# Patient Record
Sex: Female | Born: 1990
Health system: Southern US, Community
[De-identification: ages and names within clinical notes are randomized; demographics above are authoritative.]

## PROBLEM LIST (undated history)

## (undated) ENCOUNTER — Emergency Department (HOSPITAL_COMMUNITY): Admission: EM | Payer: Medicaid Other | Source: Home / Self Care

## (undated) DIAGNOSIS — F319 Bipolar disorder, unspecified: Secondary | ICD-10-CM

## (undated) DIAGNOSIS — A749 Chlamydial infection, unspecified: Secondary | ICD-10-CM

## (undated) DIAGNOSIS — F419 Anxiety disorder, unspecified: Secondary | ICD-10-CM

## (undated) DIAGNOSIS — N760 Acute vaginitis: Secondary | ICD-10-CM

## (undated) HISTORY — PX: OTHER SURGICAL HISTORY: SHX169

## (undated) HISTORY — DX: Chlamydial infection, unspecified: A74.9

## (undated) HISTORY — PX: ABSCESS DRAINAGE: SHX1119

## (undated) HISTORY — DX: Acute vaginitis: N76.0

## (undated) HISTORY — PX: HEEL SPUR SURGERY: SHX665

## (undated) HISTORY — PX: TONSILLECTOMY AND ADENOIDECTOMY: SUR1326

---
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
## 2008-10-02 ENCOUNTER — Inpatient Hospital Stay (HOSPITAL_COMMUNITY): Admission: EM | Admit: 2008-10-02 | Discharge: 2008-10-06 | Payer: Self-pay | Admitting: Emergency Medicine

## 2008-10-06 ENCOUNTER — Ambulatory Visit: Payer: Self-pay | Admitting: Psychiatry

## 2008-10-06 ENCOUNTER — Inpatient Hospital Stay (HOSPITAL_COMMUNITY): Admission: RE | Admit: 2008-10-06 | Discharge: 2008-10-13 | Payer: Self-pay | Admitting: Psychiatry

## 2009-12-19 ENCOUNTER — Emergency Department (HOSPITAL_COMMUNITY): Admission: EM | Admit: 2009-12-19 | Discharge: 2009-12-20 | Payer: Self-pay | Admitting: Emergency Medicine

## 2009-12-20 ENCOUNTER — Inpatient Hospital Stay (HOSPITAL_COMMUNITY): Admission: AD | Admit: 2009-12-20 | Discharge: 2009-12-24 | Payer: Self-pay | Admitting: Psychiatry

## 2009-12-20 ENCOUNTER — Ambulatory Visit: Payer: Self-pay | Admitting: Psychiatry

## 2010-01-11 ENCOUNTER — Emergency Department (HOSPITAL_COMMUNITY): Admission: EM | Admit: 2010-01-11 | Discharge: 2010-01-13 | Payer: Self-pay | Admitting: Emergency Medicine

## 2010-02-21 ENCOUNTER — Inpatient Hospital Stay (HOSPITAL_COMMUNITY): Admission: EM | Admit: 2010-02-21 | Discharge: 2010-02-24 | Payer: Self-pay | Admitting: Psychiatry

## 2010-02-21 ENCOUNTER — Ambulatory Visit: Payer: Self-pay | Admitting: Psychiatry

## 2010-02-21 ENCOUNTER — Emergency Department (HOSPITAL_COMMUNITY): Admission: EM | Admit: 2010-02-21 | Discharge: 2010-02-21 | Payer: Self-pay | Admitting: Emergency Medicine

## 2010-04-12 ENCOUNTER — Ambulatory Visit: Payer: Self-pay | Admitting: Psychiatry

## 2010-08-18 LAB — COMPREHENSIVE METABOLIC PANEL
ALT: 10 U/L (ref 0–35)
CO2: 24 mEq/L (ref 19–32)
Creatinine, Ser: 0.76 mg/dL (ref 0.4–1.2)
GFR calc Af Amer: >60 mL/min (ref >60)
GFR calc non Af Amer: >60 mL/min (ref >60)
Potassium: 2.9 mEq/L — ABNORMAL LOW (ref 3.5–5.1)
Sodium: 140 mEq/L (ref 135–145)
Total Bilirubin: 1 mg/dL (ref 0.3–1.2)
Total Protein: 7 g/dL (ref 6.0–8.3)

## 2010-08-18 LAB — URINALYSIS, ROUTINE W REFLEX MICROSCOPIC
Glucose, UA: NEGATIVE mg/dL
Hgb urine dipstick: NEGATIVE
Specific Gravity, Urine: 1.031 — ABNORMAL HIGH (ref 1.005–1.030)
pH: 6 (ref 5.0–8.0)

## 2010-08-18 LAB — DIFFERENTIAL
Basophils Absolute: 0 10*3/uL (ref 0.0–0.1)
Basophils Relative: 1 % (ref 0–1)
Lymphocytes Relative: 40 % (ref 12–46)
Monocytes Absolute: 1.1 10*3/uL — ABNORMAL HIGH (ref 0.1–1.0)
Monocytes Relative: 13 % — ABNORMAL HIGH (ref 3–12)
Neutro Abs: 4 10*3/uL (ref 1.7–7.7)

## 2010-08-18 LAB — POCT PREGNANCY, URINE: Preg Test, Ur: NEGATIVE

## 2010-08-18 LAB — URINE MICROSCOPIC-ADD ON

## 2010-08-18 LAB — RAPID URINE DRUG SCREEN, HOSP PERFORMED
Barbiturates: NOT DETECTED
Benzodiazepines: NOT DETECTED
Opiates: NOT DETECTED

## 2010-08-18 LAB — SALICYLATE LEVEL: Salicylate Lvl: <4 mg/dL (ref 2.8–20.0)

## 2010-08-18 LAB — CBC: MCHC: 34.1 g/dL (ref 30.0–36.0)

## 2010-08-18 LAB — RPR: RPR Ser Ql: NONREACTIVE

## 2010-08-19 LAB — CBC
HCT: 41.5 % (ref 36.0–46.0)
Hemoglobin: 14.3 g/dL (ref 12.0–15.0)
MCH: 31.7 pg (ref 26.0–34.0)
MCV: 92 fL (ref 78.0–100.0)
Platelets: 153 10*3/uL (ref 150–400)
RDW: 13.5 % (ref 11.5–15.5)
WBC: 7.6 10*3/uL (ref 4.0–10.5)

## 2010-08-19 LAB — COMPREHENSIVE METABOLIC PANEL
Albumin: 4.1 g/dL (ref 3.5–5.2)
BUN: 15 mg/dL (ref 6–23)
Creatinine, Ser: 0.87 mg/dL (ref 0.4–1.2)
GFR calc Af Amer: >60 mL/min (ref >60)
Total Protein: 7.9 g/dL (ref 6.0–8.3)

## 2010-08-19 LAB — RAPID URINE DRUG SCREEN, HOSP PERFORMED
Barbiturates: NOT DETECTED
Cocaine: NOT DETECTED
Tetrahydrocannabinol: POSITIVE — AB

## 2010-08-19 LAB — ETHANOL: Alcohol, Ethyl (B): <5 mg/dL (ref 0–10)

## 2010-08-19 LAB — URINALYSIS, ROUTINE W REFLEX MICROSCOPIC
Hgb urine dipstick: NEGATIVE
Urobilinogen, UA: 1 mg/dL (ref 0.0–1.0)
pH: 6.5 (ref 5.0–8.0)

## 2010-08-19 LAB — URINE MICROSCOPIC-ADD ON

## 2010-08-19 LAB — DIFFERENTIAL
Basophils Relative: 0 % (ref 0–1)
Eosinophils Absolute: 0.1 10*3/uL (ref 0.0–0.7)
Eosinophils Relative: 1 % (ref 0–5)
Lymphs Abs: 3.6 10*3/uL (ref 0.7–4.0)
Neutro Abs: 3.1 10*3/uL (ref 1.7–7.7)

## 2010-08-19 LAB — VALPROIC ACID LEVEL: Valproic Acid Lvl: 125.2 ug/mL — ABNORMAL HIGH (ref 50.0–100.0)

## 2010-08-19 LAB — URINE CULTURE: Culture  Setup Time: 201108100453

## 2010-08-20 LAB — DIFFERENTIAL
Basophils Relative: 0 % (ref 0–1)
Eosinophils Absolute: 0 10*3/uL (ref 0.0–0.7)
Monocytes Relative: 6 % (ref 3–12)
Neutro Abs: 2.8 10*3/uL (ref 1.7–7.7)
Neutrophils Relative %: 51 % (ref 43–77)

## 2010-08-20 LAB — URINALYSIS, ROUTINE W REFLEX MICROSCOPIC
Glucose, UA: NEGATIVE mg/dL
Nitrite: NEGATIVE
pH: 6.5 (ref 5.0–8.0)

## 2010-08-20 LAB — COMPREHENSIVE METABOLIC PANEL
ALT: <8 U/L (ref 0–35)
AST: 14 U/L (ref 0–37)
Alkaline Phosphatase: 57 U/L (ref 39–117)
CO2: 21 mEq/L (ref 19–32)
Calcium: 9.2 mg/dL (ref 8.4–10.5)
Chloride: 110 mEq/L (ref 96–112)
GFR calc Af Amer: >60 mL/min (ref >60)
GFR calc non Af Amer: >60 mL/min (ref >60)
Glucose, Bld: 84 mg/dL (ref 70–99)
Potassium: 3.9 mEq/L (ref 3.5–5.1)
Sodium: 136 mEq/L (ref 135–145)

## 2010-08-20 LAB — URINE CULTURE: Culture  Setup Time: 201107172134

## 2010-08-20 LAB — CBC
Hemoglobin: 14.5 g/dL (ref 12.0–15.0)
MCH: 31.7 pg (ref 26.0–34.0)
MCHC: 34.2 g/dL (ref 30.0–36.0)
Platelets: 212 10*3/uL (ref 150–400)
RBC: 4.56 MIL/uL (ref 3.87–5.11)

## 2010-08-20 LAB — TSH: TSH: 0.66 u[IU]/mL (ref 0.350–4.500)

## 2010-08-20 LAB — POCT I-STAT, CHEM 8
Creatinine, Ser: 0.7 mg/dL (ref 0.4–1.2)
HCT: 45 % (ref 36.0–46.0)
Hemoglobin: 15.3 g/dL — ABNORMAL HIGH (ref 12.0–15.0)
Potassium: 3.5 mEq/L (ref 3.5–5.1)
Sodium: 141 mEq/L (ref 135–145)
TCO2: 22 mmol/L (ref 0–100)

## 2010-08-20 LAB — URINE MICROSCOPIC-ADD ON

## 2010-08-20 LAB — RAPID URINE DRUG SCREEN, HOSP PERFORMED
Amphetamines: NOT DETECTED
Cocaine: NOT DETECTED
Opiates: NOT DETECTED
Tetrahydrocannabinol: NOT DETECTED

## 2010-08-20 LAB — POCT PREGNANCY, URINE: Preg Test, Ur: NEGATIVE

## 2010-08-20 LAB — ETHANOL: Alcohol, Ethyl (B): 7 mg/dL (ref 0–10)

## 2010-09-13 LAB — BASIC METABOLIC PANEL
BUN: 4 mg/dL — ABNORMAL LOW (ref 6–23)
BUN: 6 mg/dL (ref 6–23)
CO2: 19 mEq/L (ref 19–32)
Calcium: 8.2 mg/dL — ABNORMAL LOW (ref 8.4–10.5)
Calcium: 8.3 mg/dL — ABNORMAL LOW (ref 8.4–10.5)
GFR calc non Af Amer: >60 mL/min (ref >60)
Glucose, Bld: 102 mg/dL — ABNORMAL HIGH (ref 70–99)
Glucose, Bld: 81 mg/dL (ref 70–99)
Sodium: 136 mEq/L (ref 135–145)
Sodium: 137 mEq/L (ref 135–145)

## 2010-09-13 LAB — CBC
HCT: 32.9 % — ABNORMAL LOW (ref 36.0–46.0)
Hemoglobin: 11.1 g/dL — ABNORMAL LOW (ref 12.0–15.0)
MCHC: 33.7 g/dL (ref 30.0–36.0)
Platelets: 122 10*3/uL — ABNORMAL LOW (ref 150–400)
Platelets: 134 10*3/uL — ABNORMAL LOW (ref 150–400)
RDW: 13.1 % (ref 11.5–15.5)
RDW: 13.1 % (ref 11.5–15.5)
WBC: 5.5 10*3/uL (ref 4.0–10.5)

## 2010-09-13 LAB — T4, FREE: Free T4: 1.24 ng/dL (ref 0.80–1.80)

## 2010-09-13 LAB — VALPROIC ACID LEVEL: Valproic Acid Lvl: 123.9 ug/mL — ABNORMAL HIGH (ref 50.0–100.0)

## 2010-09-14 LAB — COMPREHENSIVE METABOLIC PANEL
Albumin: 3.7 g/dL (ref 3.5–5.2)
BUN: 10 mg/dL (ref 6–23)
Chloride: 110 mEq/L (ref 96–112)
Creatinine, Ser: 0.88 mg/dL (ref 0.4–1.2)
GFR calc non Af Amer: >60 mL/min (ref >60)
Glucose, Bld: 168 mg/dL — ABNORMAL HIGH (ref 70–99)
Total Bilirubin: 0.6 mg/dL (ref 0.3–1.2)

## 2010-09-14 LAB — LAMOTRIGINE LEVEL

## 2010-09-14 LAB — URINALYSIS, ROUTINE W REFLEX MICROSCOPIC
Leukocytes, UA: NEGATIVE
Nitrite: NEGATIVE
Specific Gravity, Urine: 1.022 (ref 1.005–1.030)
Urobilinogen, UA: 1 mg/dL (ref 0.0–1.0)
pH: 6 (ref 5.0–8.0)

## 2010-09-14 LAB — DIFFERENTIAL
Basophils Absolute: 0 10*3/uL (ref 0.0–0.1)
Lymphocytes Relative: 45 % (ref 12–46)
Monocytes Absolute: 1 10*3/uL (ref 0.1–1.0)
Neutro Abs: 3.7 10*3/uL (ref 1.7–7.7)
Neutrophils Relative %: 43 % (ref 43–77)

## 2010-09-14 LAB — URINE MICROSCOPIC-ADD ON

## 2010-09-14 LAB — RAPID URINE DRUG SCREEN, HOSP PERFORMED
Amphetamines: NOT DETECTED
Benzodiazepines: NOT DETECTED
Tetrahydrocannabinol: POSITIVE — AB

## 2010-09-14 LAB — CK: Total CK: 124 U/L (ref 7–177)

## 2010-09-14 LAB — TSH: TSH: 0.333 u[IU]/mL — ABNORMAL LOW (ref 0.350–4.500)

## 2010-09-14 LAB — CBC
HCT: 37 % (ref 36.0–46.0)
MCV: 91.9 fL (ref 78.0–100.0)
Platelets: 193 10*3/uL (ref 150–400)
WBC: 8.6 10*3/uL (ref 4.0–10.5)

## 2010-09-14 LAB — SALICYLATE LEVEL: Salicylate Lvl: <4 mg/dL (ref 2.8–20.0)

## 2010-10-03 ENCOUNTER — Emergency Department (HOSPITAL_COMMUNITY)
Admission: EM | Admit: 2010-10-03 | Discharge: 2010-10-03 | Disposition: A | Discharge status: Home / Self Care | Payer: 59 | Attending: Emergency Medicine | Admitting: Emergency Medicine

## 2010-10-03 DIAGNOSIS — L02219 Cutaneous abscess of trunk, unspecified: Secondary | ICD-10-CM | POA: Insufficient documentation

## 2010-10-18 NOTE — Discharge Summary (Signed)
NAMEMarland Kitchen  REGNIA, MATHWIG NO.:  [id number]   MEDICAL RECORD NO.:  [id number]          PATIENT TYPE:  INP   LOCATION:  1513                         FACILITY:  [facility]   PHYSICIAN:  Theodosia Paling, MD    DATE OF BIRTH:  09-21-1990   DATE OF ADMISSION:  10/02/2008  DATE OF DISCHARGE:  10/06/2008                               DISCHARGE SUMMARY   PRIMARY CARE PHYSICIAN:  The patient does not have a PCP, she follows at  [facility] for her psych issues according to her admission  note.  Please refer to the excellent admission note dictated by Dr.  Pedro Earls on October 02, 2008.   DISCHARGE DIAGNOSES:  1. Agitation.  2. Nonspecific psychotic disorder.   DISCHARGE MEDICATIONS:  1. Depakote 500 mg p.o. nightly.  2. Risperdal 1 mg p.o. q.12 h.  3. Topamax 25 mg p.o. q.12 h.  4. Ativan 0.5 mg p.o. q.6 h., p.r.n.   HOSPITAL COURSE:  The following issues were addressed during the  hospitalization;   1. Agitation.  I am not sure what contributed to the patient's acute      agitation.  It could be psychosis exacerbation.  According to the      patient, she smoked marijuana and may have overdosed on Lamictal      which was given only for 5 days to her.  However, her home      medication does not show Lamictal, but that could be one of the      medications.  She is oriented x3.  2. Psychotic disorder, otherwise not specified.   The patient's home medications were continued.  The patient did exhibit  some sign of psychosis and mania.  Dr. Jeanie Sewer evaluated the patient  on Oct 05, 2008, and recommended inpatient psych evaluation that the  patient is going to be transferred to.   IMAGING PERFORMED:  CT of the head performed on October 02, 2008, did not  show any acute intracranial events.   PROCEDURE PERFORMED:  None.   DISPOSITION:  The patient is getting transferred to inpatient psych  floor for further evaluation and management.   Total time spent 45  minutes.      Theodosia Paling, MD  Electronically Signed     NP/MEDQ  D:  10/06/2008  T:  10/06/2008  Job:  [id number]
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
## 2010-10-18 NOTE — Discharge Summary (Signed)
NAMEMarland Kitchen  Elizabeth [name], Elizabeth [name]              ACCOUNT NO.:  [id number]   MEDICAL RECORD NO.:  [id number]          PATIENT TYPE:  INP   LOCATION:  1513                         FACILITY:  [facility]   PHYSICIAN:  Theodosia Paling, MD    DATE OF BIRTH:  10-14-1990   DATE OF ADMISSION:  10/02/2008  DATE OF DISCHARGE:  10/06/2008                               DISCHARGE SUMMARY   DISCHARGE DIAGNOSES:  1. Agitation.  2. Psychosis disorder not otherwise specified.   DISCHARGE MEDICATIONS:  1. Risperdal 1 mg p.o. q.12 hours.  2. Depakote 500 mg p.o. q.h.s.  3. Topamax 25 mg p.o. q.12 hours.  4. Ativan 0.5 mg p.o. q.6 hours p.r.n.   HOSPITAL COURSE:  The following issues were addressed during the  hospitalization.  1. Agitation, unknown etiology.  A CT scan was negative for any acute      intracranial event.  The patient admits to smoking marijuana, and      she was discharged on Lamictal to take only for a few days and then      to stop completely.  However, she thinks that she may have      overdosed on them.  Patient's mentation resolved.  She was much      more calm, and did not have any further evidence of vegetation      through the hospitalization.  2. Psychotic disorder not otherwise specified.  The patient's      psychotic medications were continued.  She had more signs of mania      than psychosis.  Dr. Jeanie Sewer evaluated the patient.  Recommended      inpatient psychiatric evaluation and management.  That is where the      patient is going today.  She is hemodynamically stable.  Her blood      work has been normal through the hospitalization.   DISPOSITION:  The patient is going to go to inpatient psychiatry for  further evaluation and management.  Does not have a PCP to which this  discharge summary can be forwarded to.  Consultation performed as  mentioned in HPI.  Imaging performed as mentioned in HPI.   PROCEDURE PERFORMED:  None.   Total time spent 45 minutes.      Theodosia Paling, MD  Electronically Signed     NP/MEDQ  D:  10/06/2008  T:  10/06/2008  Job:  [id number]
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
## 2010-11-16 ENCOUNTER — Emergency Department (HOSPITAL_COMMUNITY)
Admission: EM | Admit: 2010-11-16 | Discharge: 2010-11-17 | Disposition: A | Discharge status: Other Acute Inpatient Hospital | Payer: 59 | Attending: Emergency Medicine | Admitting: Emergency Medicine

## 2010-11-16 DIAGNOSIS — F329 Major depressive disorder, single episode, unspecified: Secondary | ICD-10-CM | POA: Insufficient documentation

## 2010-11-16 DIAGNOSIS — F3289 Other specified depressive episodes: Secondary | ICD-10-CM | POA: Insufficient documentation

## 2010-11-16 LAB — COMPREHENSIVE METABOLIC PANEL
ALT: 6 U/L (ref 0–35)
Albumin: 4.1 g/dL (ref 3.5–5.2)
Alkaline Phosphatase: 49 U/L (ref 39–117)
BUN: 6 mg/dL (ref 6–23)
Chloride: 103 mEq/L (ref 96–112)
Glucose, Bld: 93 mg/dL (ref 70–99)
Potassium: 3.2 mEq/L — ABNORMAL LOW (ref 3.5–5.1)
Sodium: 135 mEq/L (ref 135–145)
Total Bilirubin: 0.7 mg/dL (ref 0.3–1.2)

## 2010-11-16 LAB — URINE MICROSCOPIC-ADD ON

## 2010-11-16 LAB — DIFFERENTIAL
Basophils Absolute: 0.1 10*3/uL (ref 0.0–0.1)
Basophils Relative: 1 % (ref 0–1)
Eosinophils Absolute: 0.1 10*3/uL (ref 0.0–0.7)
Lymphs Abs: 2.1 10*3/uL (ref 0.7–4.0)
Monocytes Absolute: 0.8 10*3/uL (ref 0.1–1.0)
Neutro Abs: 2.7 10*3/uL (ref 1.7–7.7)

## 2010-11-16 LAB — CBC
MCH: 30.8 pg (ref 26.0–34.0)
MCHC: 35.5 g/dL (ref 30.0–36.0)
MCV: 86.9 fL (ref 78.0–100.0)
Platelets: 219 10*3/uL (ref 150–400)

## 2010-11-16 LAB — URINALYSIS, ROUTINE W REFLEX MICROSCOPIC
Bilirubin Urine: NEGATIVE
Hgb urine dipstick: NEGATIVE
Ketones, ur: 15 mg/dL — AB
Nitrite: NEGATIVE
Specific Gravity, Urine: 1.013 (ref 1.005–1.030)
pH: 6 (ref 5.0–8.0)

## 2010-11-16 LAB — RAPID URINE DRUG SCREEN, HOSP PERFORMED
Opiates: NOT DETECTED
Tetrahydrocannabinol: NOT DETECTED

## 2010-11-16 LAB — ETHANOL: Alcohol, Ethyl (B): <11 mg/dL — ABNORMAL HIGH (ref 0–10)

## 2010-11-17 DIAGNOSIS — F29 Unspecified psychosis not due to a substance or known physiological condition: Secondary | ICD-10-CM

## 2010-11-17 LAB — URINE CULTURE

## 2010-11-24 ENCOUNTER — Encounter (INDEPENDENT_AMBULATORY_CARE_PROVIDER_SITE_OTHER): Payer: Self-pay | Admitting: General Surgery

## 2010-11-28 ENCOUNTER — Ambulatory Visit (INDEPENDENT_AMBULATORY_CARE_PROVIDER_SITE_OTHER): Payer: Self-pay | Admitting: General Surgery

## 2010-12-26 ENCOUNTER — Emergency Department (HOSPITAL_COMMUNITY)
Admission: EM | Admit: 2010-12-26 | Discharge: 2010-12-26 | Disposition: A | Discharge status: Home / Self Care | Payer: 59 | Attending: Emergency Medicine | Admitting: Emergency Medicine

## 2010-12-26 DIAGNOSIS — F3289 Other specified depressive episodes: Secondary | ICD-10-CM | POA: Insufficient documentation

## 2010-12-26 DIAGNOSIS — F329 Major depressive disorder, single episode, unspecified: Secondary | ICD-10-CM | POA: Insufficient documentation

## 2010-12-26 DIAGNOSIS — F411 Generalized anxiety disorder: Secondary | ICD-10-CM | POA: Insufficient documentation

## 2010-12-26 DIAGNOSIS — T43591A Poisoning by other antipsychotics and neuroleptics, accidental (unintentional), initial encounter: Secondary | ICD-10-CM | POA: Insufficient documentation

## 2010-12-26 LAB — DIFFERENTIAL
Basophils Relative: 0 % (ref 0–1)
Lymphocytes Relative: 30 % (ref 12–46)
Lymphs Abs: 2.2 10*3/uL (ref 0.7–4.0)
Monocytes Absolute: 1 10*3/uL (ref 0.1–1.0)
Monocytes Relative: 13 % — ABNORMAL HIGH (ref 3–12)
Neutro Abs: 4.2 10*3/uL (ref 1.7–7.7)
Neutrophils Relative %: 56 % (ref 43–77)

## 2010-12-26 LAB — CBC
HCT: 37.5 % (ref 36.0–46.0)
Hemoglobin: 12.8 g/dL (ref 12.0–15.0)
MCH: 30.8 pg (ref 26.0–34.0)
MCHC: 34.1 g/dL (ref 30.0–36.0)
MCV: 90.1 fL (ref 78.0–100.0)
RBC: 4.16 MIL/uL (ref 3.87–5.11)

## 2010-12-26 LAB — URINALYSIS, ROUTINE W REFLEX MICROSCOPIC
Bilirubin Urine: NEGATIVE
Glucose, UA: NEGATIVE mg/dL
Specific Gravity, Urine: 1.011 (ref 1.005–1.030)
Urobilinogen, UA: 0.2 mg/dL (ref 0.0–1.0)

## 2010-12-26 LAB — COMPREHENSIVE METABOLIC PANEL
ALT: 24 U/L (ref 0–35)
Albumin: 4.2 g/dL (ref 3.5–5.2)
Alkaline Phosphatase: 68 U/L (ref 39–117)
BUN: 10 mg/dL (ref 6–23)
Chloride: 101 mEq/L (ref 96–112)
GFR calc Af Amer: >60 mL/min (ref >60)
Glucose, Bld: 85 mg/dL (ref 70–99)
Potassium: 3.7 mEq/L (ref 3.5–5.1)
Total Bilirubin: 0.4 mg/dL (ref 0.3–1.2)

## 2010-12-26 LAB — RAPID URINE DRUG SCREEN, HOSP PERFORMED
Amphetamines: NOT DETECTED
Barbiturates: NOT DETECTED
Opiates: NOT DETECTED
Tetrahydrocannabinol: NOT DETECTED

## 2010-12-26 LAB — VALPROIC ACID LEVEL: Valproic Acid Lvl: 47.3 ug/mL — ABNORMAL LOW (ref 50.0–100.0)

## 2010-12-26 LAB — URINE MICROSCOPIC-ADD ON

## 2010-12-26 LAB — ETHANOL: Alcohol, Ethyl (B): <11 mg/dL (ref 0–11)

## 2011-01-03 ENCOUNTER — Emergency Department (HOSPITAL_COMMUNITY)
Admission: EM | Admit: 2011-01-03 | Discharge: 2011-01-04 | Disposition: A | Discharge status: Other Acute Inpatient Hospital | Payer: 59 | Attending: Emergency Medicine | Admitting: Emergency Medicine

## 2011-01-03 DIAGNOSIS — F17200 Nicotine dependence, unspecified, uncomplicated: Secondary | ICD-10-CM | POA: Insufficient documentation

## 2011-01-03 DIAGNOSIS — F3130 Bipolar disorder, current episode depressed, mild or moderate severity, unspecified: Secondary | ICD-10-CM | POA: Insufficient documentation

## 2011-01-03 LAB — DIFFERENTIAL
Basophils Absolute: 0 10*3/uL (ref 0.0–0.1)
Basophils Relative: 1 % (ref 0–1)
Eosinophils Absolute: 0.1 10*3/uL (ref 0.0–0.7)
Eosinophils Relative: 2 % (ref 0–5)
Lymphs Abs: 2.7 10*3/uL (ref 0.7–4.0)
Neutrophils Relative %: 27 % — ABNORMAL LOW (ref 43–77)

## 2011-01-03 LAB — RAPID URINE DRUG SCREEN, HOSP PERFORMED
Amphetamines: NOT DETECTED
Benzodiazepines: NOT DETECTED
Opiates: NOT DETECTED

## 2011-01-03 LAB — CBC
MCV: 87.8 fL (ref 78.0–100.0)
Platelets: 210 10*3/uL (ref 150–400)
RBC: 4.18 MIL/uL (ref 3.87–5.11)
RDW: 12.6 % (ref 11.5–15.5)
WBC: 4.8 10*3/uL (ref 4.0–10.5)

## 2011-01-03 LAB — BASIC METABOLIC PANEL
Chloride: 103 mEq/L (ref 96–112)
GFR calc Af Amer: >60 mL/min (ref >60)
GFR calc non Af Amer: >60 mL/min (ref >60)
Potassium: 3.5 mEq/L (ref 3.5–5.1)
Sodium: 136 mEq/L (ref 135–145)

## 2011-01-03 LAB — PREGNANCY, URINE: Preg Test, Ur: NEGATIVE

## 2011-01-03 LAB — ETHANOL: Alcohol, Ethyl (B): <11 mg/dL (ref 0–11)

## 2011-01-04 ENCOUNTER — Inpatient Hospital Stay (HOSPITAL_COMMUNITY)
Admission: AD | Admit: 2011-01-04 | Discharge: 2011-01-06 | DRG: 885 | Disposition: A | Discharge status: Home / Self Care | Payer: 59 | Source: Ambulatory Visit | Attending: Psychiatry | Admitting: Psychiatry

## 2011-01-04 DIAGNOSIS — F3110 Bipolar disorder, current episode manic without psychotic features, unspecified: Principal | ICD-10-CM

## 2011-01-04 DIAGNOSIS — IMO0002 Reserved for concepts with insufficient information to code with codable children: Secondary | ICD-10-CM

## 2011-01-04 DIAGNOSIS — Z818 Family history of other mental and behavioral disorders: Secondary | ICD-10-CM

## 2011-01-04 DIAGNOSIS — Z880 Allergy status to penicillin: Secondary | ICD-10-CM

## 2011-01-04 LAB — COMPREHENSIVE METABOLIC PANEL
ALT: 8 U/L (ref 0–35)
AST: 18 U/L (ref 0–37)
CO2: 24 mEq/L (ref 19–32)
Calcium: 9.6 mg/dL (ref 8.4–10.5)
Chloride: 102 mEq/L (ref 96–112)
Creatinine, Ser: 0.69 mg/dL (ref 0.50–1.10)
GFR calc Af Amer: >60 mL/min (ref >60)
GFR calc non Af Amer: >60 mL/min (ref >60)
Glucose, Bld: 83 mg/dL (ref 70–99)
Total Bilirubin: 0.3 mg/dL (ref 0.3–1.2)

## 2011-01-05 DIAGNOSIS — F3110 Bipolar disorder, current episode manic without psychotic features, unspecified: Secondary | ICD-10-CM

## 2011-01-10 NOTE — Discharge Summary (Signed)
  NAMEMarland Kitchen  Elizabeth [name], Elizabeth [name] NO.:  [id number]  MEDICAL RECORD NO.:  [id number]  LOCATION:  0508                          [facility]ILITY[facility]  PHYSICIAN:  Franchot Gallo, MD     DATE OF BIRTH:  1991/02/26  DATE OF ADMISSION:  01/04/2011 DATE OF DISCHARGE:  01/06/2011                              DISCHARGE SUMMARY   REASON FOR ADMISSION:  This was a 20 year old female that presented with pressured speech, loud in  volume,  reporting  that she is going to hurt someone or herself and is here to have her medications readjusted.  FINAL IMPRESSION:   Axis I:  Bipolar 1  disorder, most recent episode hypomanic. AXIS II: Deferred. AXIS III:  No acute illnesses. AXIS IV:  Legal issues, chronic mental illness. AXIS V:  GAF at discharge 70.  SIGNIFICANT FINDINGS:  The patient was admitted to the _adult milieu  for safety and stabilization.  We checked a Depakote level and adjust her medications as indicated.  She was reporting vivid nightmares.  She stated her appetite was good, having mild depressive symptoms rating it 2  on a scale of 1-10.  Denied any suicidal or homicidal thoughts or auditory hallucinations, having moderate anxiety, rating it a 5 and was reporting that she was having vivid nightmares with trazodone.  We added Neurontin to help with anxiety, continued to monitor her mood and affect. The following day her sleep was very good.  Her appetite was good.  Her depression had resolved rating it a 0,  having no medication side effects.  There was attempt by the counselor to contact the patient's significant other who stated he was at work and did not have time to listen to suicide prevention education.  On day of discharge the patient's sleep was good.  Her appetite was good.  Her depression had resolved rating it a 1  on a scale of 1-10. She showed no manic or hypomanic symptoms.  She adamantly denied any suicidal or homicidal thoughts.  Denied any auditory or  visual hallucinations, having mild anxiety rating it  a 2 on a scale of 1-10.  DISCHARGE MEDICATIONS:  Her discharge medications include gabapentin 300 mg one b.i.d., multivitamin daily.  Nicotine patches daily and trazodone 100 mg and Depakote ER 500 mg taking two at bedtime, Risperdal 2 mg b.i.d.  FOLLOWUP:  Her follow-up appointment was with [facility] at phone number [phone].  The patient was to walk in between the hours of 8 to 11.     Landry Corporal, N.P.   ______________________________ Franchot Gallo, MD    JO/MEDQ  D:  01/09/2011  T:  01/09/2011  Job:  [id number]  Electronically Signed by Limmie PatriciaP. on 01/09/2011 03:00:14 PM Electronically Signed by Franchot Gallo MD on 01/10/2011 08:23:32 AM
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
## 2011-03-20 ENCOUNTER — Other Ambulatory Visit: Payer: Self-pay | Admitting: Gynecology

## 2011-03-20 DIAGNOSIS — R7989 Other specified abnormal findings of blood chemistry: Secondary | ICD-10-CM

## 2011-03-24 ENCOUNTER — Ambulatory Visit
Admission: RE | Admit: 2011-03-24 | Discharge: 2011-03-24 | Disposition: A | Discharge status: Home / Self Care | Payer: 59 | Source: Ambulatory Visit | Attending: Gynecology | Admitting: Gynecology

## 2011-03-24 DIAGNOSIS — R7989 Other specified abnormal findings of blood chemistry: Secondary | ICD-10-CM

## 2011-03-24 MED ORDER — GADOBENATE DIMEGLUMINE 529 MG/ML IV SOLN
8.0000 mL | Freq: Once | INTRAVENOUS | Status: AC | PRN
  Administered 2011-03-24: 8 mL via INTRAVENOUS

## 2011-07-30 ENCOUNTER — Encounter (HOSPITAL_COMMUNITY): Payer: Self-pay | Admitting: Adult Health

## 2011-07-30 ENCOUNTER — Emergency Department (HOSPITAL_COMMUNITY): Payer: 59

## 2011-07-30 ENCOUNTER — Emergency Department (HOSPITAL_COMMUNITY)
Admission: EM | Admit: 2011-07-30 | Discharge: 2011-07-30 | Disposition: A | Discharge status: Home / Self Care | Payer: 59 | Attending: Emergency Medicine | Admitting: Emergency Medicine

## 2011-07-30 DIAGNOSIS — S99919A Unspecified injury of unspecified ankle, initial encounter: Secondary | ICD-10-CM | POA: Insufficient documentation

## 2011-07-30 DIAGNOSIS — M25569 Pain in unspecified knee: Secondary | ICD-10-CM | POA: Insufficient documentation

## 2011-07-30 DIAGNOSIS — S8990XA Unspecified injury of unspecified lower leg, initial encounter: Secondary | ICD-10-CM | POA: Insufficient documentation

## 2011-07-30 DIAGNOSIS — X500XXA Overexertion from strenuous movement or load, initial encounter: Secondary | ICD-10-CM | POA: Insufficient documentation

## 2011-07-30 MED ORDER — OXYCODONE-ACETAMINOPHEN 5-325 MG PO TABS: 1.0000 | ORAL_TABLET | Freq: Four times a day (QID) | ORAL | Status: AC | PRN

## 2011-07-30 MED ORDER — IBUPROFEN 600 MG PO TABS: 600.0000 mg | ORAL_TABLET | Freq: Four times a day (QID) | ORAL | Status: AC | PRN

## 2011-07-30 NOTE — ED Provider Notes (Signed)
Medical screening examination/treatment/procedure(s) were performed by non-physician practitioner and as supervising physician I was immediately available for consultation/collaboration.   Lyanne Co, MD 07/30/11 [phone]

## 2011-07-30 NOTE — ED Provider Notes (Signed)
History     CSN: [id number]  Arrival date & time 07/30/11  1701   None     Chief Complaint  Patient presents with  . Knee Injury    (Consider location/radiation/quality/duration/timing/severity/associated sxs/prior treatment) HPI  Pt presents to the ED with complaints of knee injury after doing the splits at a party last night. She states that when she went down into the split, she thinks her knee cap popped out of place then popped back in. Since then she says, her knee has been painful, swollen adn hurts to walk on. She works at [employer] and doesn't think she will be able to work for the next couple of days due to the pain.  Past Medical History  Diagnosis Date  . Depression     Past Surgical History  Procedure Date  . Tonsillectomy and adenoidectomy   . Right foot surgery   . Abscess drainage     left groin    No family history on file.  History  Substance Use Topics  . Smoking status: Current Everyday Smoker -- 0.2 packs/day  . Smokeless tobacco: Never Used  . Alcohol Use: Yes     occasional    OB History    Grav Para Term Preterm Abortions TAB SAB Ect Mult Living                  Review of Systems  All other systems reviewed and are negative.    Allergies  Penicillins  Home Medications   Current Outpatient Rx  Name Route Sig Dispense Refill  . IBUPROFEN 600 MG PO TABS Oral Take 1 tablet (600 mg total) by mouth every 6 (six) hours as needed for pain. 30 tablet 0  . OXYCODONE-ACETAMINOPHEN 5-325 MG PO TABS Oral Take 1 tablet by mouth every 6 (six) hours as needed for pain. 15 tablet 0    BP 104/61  Pulse 66  Temp(Src) 98.4 F (36.9 C) (Oral)  Resp 14  SpO2 100%  LMP 04/29/2011  Physical Exam  Nursing note and vitals reviewed. Constitutional: She appears well-developed and well-nourished. No distress.  HENT:  Head: Normocephalic and atraumatic.  Eyes: Pupils are equal, round, and reactive to light.  Neck: Normal range of motion. Neck  supple.  Cardiovascular: Normal rate and regular rhythm.   Pulmonary/Chest: Effort normal.  Abdominal: Soft.  Musculoskeletal:       Right knee: She exhibits decreased range of motion (due to pain), swelling and effusion. She exhibits no ecchymosis, no deformity, no laceration, no erythema, normal alignment, no LCL laxity and normal patellar mobility. tenderness found. Medial joint line, lateral joint line and patellar tendon tenderness noted.  Neurological: She is alert.  Skin: Skin is warm and dry.    ED Course  Procedures (including critical care time)  Labs Reviewed - No data to display Dg Knee Complete 4 Views Left  07/30/2011  *RADIOLOGY REPORT*  Clinical Data: Left knee pain and swelling.  Twisting injury yesterday.  LEFT KNEE - COMPLETE 4+ VIEW  Comparison: None.  Findings: No fracture, foreign body, or acute bony findings are identified.  No knee effusion is observed.  IMPRESSION:  No significant abnormality identified.  Original Report Authenticated By: Dellia Cloud, M.D.     1. Knee injury       MDM  Pt given crutches, a knee sleeve and referral to Ortho, Dr. August Saucer. Pt also given an Rx for percocet (15 tabs) and Ibuprofen 600mg . Pt also given a note to have  a couple of days off work as their are no light duty positions.        Dorthula Matas, PA 07/30/11 [phone]

## 2011-07-30 NOTE — Discharge Instructions (Signed)
Athletic Injuries Proper early treatment and rehabilitation leads to a quicker recovery for most athletic injuries. You may be able to return to your sport fully recovered in less time if you follow these general rules:   Rest. Rest the injury until movement is no longer painful. Using an injured joint or muscle will prolong the problem.   Elevate. Keep the injured area elevated until most of the swelling and pain are gone. If possible, keep the injured area above the level of your heart.   Ice. Use ice packs directly on the injury for 3 to 4 days.   Compression. Use an elastic bandage applied to your injury as directed. This will reduce swelling, although elastic wraps do not protect injured joints. More rigid splints and taping are better for this purpose.   Rehabilitation. This should begin as soon as the swelling and pain of your injury subside, and as directed by your caregiver. It includes exercises to improve joint motion and muscular strength. Occasionally special braces, splints, or orthotics are used to protect against further injury when you return to your sport.  Keeping a positive attitude will help you heal your injury more rapidly and completely. You may return to physical exercise that does not cause pain or increase the risk of re-injury or as directed. This will help maintain fitness. It will also improve your mental attitude. Do not overuse your injured extremity. This will lead to discomfort and may delay full recovery.  Document Released: 06/29/2004 Document Revised: 02/01/2011 Document Reviewed: 11/17/2008 [facility] Patient Information 2012 [place], [place].Knee Effusion The medical term for having fluid in your knee is effusion. This is often due to an internal derangement of the knee. This means something is wrong inside the knee. Some of the causes of fluid in the knee may be torn cartilage, a torn ligament, or bleeding into the joint from an injury. Your knee is likely more  difficult to bend and move. This is often because there is increased pain and pressure in the joint. The time it takes for recovery from a knee effusion depends on different factors, including:   Type of injury.   Your age.   Physical and medical conditions.   Rehabilitation Strategies.  How long you will be away from your normal activities will depend on what kind of knee problem you have and how much damage is present. Your knee has two types of cartilage. Articular cartilage covers the bone ends and lets your knee bend and move smoothly. Two menisci, thick pads of cartilage that form a rim inside the joint, help absorb shock and stabilize your knee. Ligaments bind the bones together and support your knee joint. Muscles move the joint, help support your knee, and take stress off the joint itself. CAUSES  Often an effusion in the knee is caused by an injury to one of the menisci. This is often a tear in the cartilage. Recovery after a meniscus injury depends on how much meniscus is damaged and whether you have damaged other knee tissue. Small tears may heal on their own with conservative treatment. Conservative means rest, limited weight bearing activity and muscle strengthening exercises. Your recovery may take up to 6 weeks.  TREATMENT  Larger tears may require surgery. Meniscus injuries may be treated during arthroscopy. Arthroscopy is a procedure in which your surgeon uses a small telescope like instrument to look in your knee. Your caregiver can make a more accurate diagnosis (learning what is wrong) by performing an arthroscopic procedure.  If your injury is on the inner margin of the meniscus, your surgeon may trim the meniscus back to a smooth rim. In other cases your surgeon will try to repair a damaged meniscus with stitches (sutures). This may make rehabilitation take longer, but may provide better long term result by helping your knee keep its shock absorption capabilities. Ligaments which  are completely torn usually require surgery for repair. HOME CARE INSTRUCTIONS  Use crutches as instructed.   If a brace is applied, use as directed.   Once you are home, an ice pack applied to your swollen knee may help with discomfort and help decrease swelling.   Keep your knee raised (elevated) when you are not up and around or on crutches.   Only take over-the-counter or prescription medicines for pain, discomfort, or fever as directed by your caregiver.   Your caregivers will help with instructions for rehabilitation of your knee. This often includes strengthening exercises.   You may resume a normal diet and activities as directed.  SEEK MEDICAL CARE IF:   There is increased swelling in your knee.   You notice redness, swelling, or increasing pain in your knee.   An unexplained oral temperature above 102 F (38.9 C) develops.  SEEK IMMEDIATE MEDICAL CARE IF:   You develop a rash.   You have difficulty breathing.   You have any allergic reactions from medications you may have been given.   There is severe pain with any motion of the knee.  MAKE SURE YOU:   Understand these instructions.   Will watch your condition.   Will get help right away if you are not doing well or get worse.  Document Released: 08/12/2003 Document Revised: 02/01/2011 Document Reviewed: 10/16/2007 [facility] Patient Information 2012 [place], [place].

## 2011-07-30 NOTE — ED Notes (Signed)
Pt states while at party last night was attempting to do splits when she was coming back up felt L knee pop out of place, pt states it to go back into place but now having pain. Pt states she is unable to ambulate or work

## 2011-09-29 ENCOUNTER — Other Ambulatory Visit: Payer: Self-pay | Admitting: Gynecology

## 2011-09-29 DIAGNOSIS — R7989 Other specified abnormal findings of blood chemistry: Secondary | ICD-10-CM

## 2011-10-04 ENCOUNTER — Other Ambulatory Visit: Payer: 59

## 2012-02-03 ENCOUNTER — Emergency Department (HOSPITAL_COMMUNITY)
Admission: EM | Admit: 2012-02-03 | Discharge: 2012-02-03 | Disposition: A | Discharge status: Home / Self Care | Payer: 59 | Attending: Emergency Medicine | Admitting: Emergency Medicine

## 2012-02-03 ENCOUNTER — Encounter (HOSPITAL_COMMUNITY): Payer: Self-pay | Admitting: Emergency Medicine

## 2012-02-03 DIAGNOSIS — L0291 Cutaneous abscess, unspecified: Secondary | ICD-10-CM

## 2012-02-03 DIAGNOSIS — L02419 Cutaneous abscess of limb, unspecified: Secondary | ICD-10-CM | POA: Insufficient documentation

## 2012-02-03 DIAGNOSIS — S90569A Insect bite (nonvenomous), unspecified ankle, initial encounter: Secondary | ICD-10-CM | POA: Insufficient documentation

## 2012-02-03 DIAGNOSIS — L03119 Cellulitis of unspecified part of limb: Secondary | ICD-10-CM | POA: Insufficient documentation

## 2012-02-03 DIAGNOSIS — F3289 Other specified depressive episodes: Secondary | ICD-10-CM | POA: Insufficient documentation

## 2012-02-03 DIAGNOSIS — Z880 Allergy status to penicillin: Secondary | ICD-10-CM | POA: Insufficient documentation

## 2012-02-03 DIAGNOSIS — F329 Major depressive disorder, single episode, unspecified: Secondary | ICD-10-CM | POA: Insufficient documentation

## 2012-02-03 DIAGNOSIS — W57XXXA Bitten or stung by nonvenomous insect and other nonvenomous arthropods, initial encounter: Secondary | ICD-10-CM | POA: Insufficient documentation

## 2012-02-03 DIAGNOSIS — F17200 Nicotine dependence, unspecified, uncomplicated: Secondary | ICD-10-CM | POA: Insufficient documentation

## 2012-02-03 MED ORDER — SULFAMETHOXAZOLE-TRIMETHOPRIM 800-160 MG PO TABS: 2.0000 | ORAL_TABLET | Freq: Two times a day (BID) | ORAL | Status: AC

## 2012-02-03 NOTE — ED Notes (Signed)
Pt states thought bit by mosquito 3 days ago on outer aspect right calf, states itching, applied cortisone, now with increased swelling, redness with red scab in center

## 2012-02-03 NOTE — ED Provider Notes (Signed)
History  This chart was scribed for Elizabeth Kaplan, MD by Elizabeth [name]. This patient was seen in room TR11C/TR11C and the patient's care was started at 15:15.   CSN: [id number]  Arrival date & time 02/03/12  1355   None     Chief Complaint  Patient presents with  . Insect Bite    right calf area    (Consider location/radiation/quality/duration/timing/severity/associated sxs/prior treatment) The history is provided by the patient. No language interpreter was used.  Elizabeth [name] is a 21 y.o. female who presents to the Emergency Department complaining of a painful swollen itchy area on the right calf with an associated mild headache for the past 2 days. Pt reports she was bitten by a mosquito 3 days ago and treated the itching with cortizone cream. The itching did stop but the next day the area was swollen and raised. Pt also presents some bumps around her torso and axillary area. Pt denies any associated fevers, chills, nausea, or vomiting. Pt has a h/o abscesses that she gets every couple years, but she has never had one on her leg. Her abscesses are usually treated with incision and drainage and medication (the name of which the pt cannot recall). Pt denies any chance of pregnancy because her LNMP was yesterday. Pt has no h/o DM and is otherwise healthy. Pt reports she is allergic to penicilin.   Past Medical History  Diagnosis Date  . Depression     Past Surgical History  Procedure Date  . Right foot surgery   . Abscess drainage     left groin  . Tonsillectomy and adenoidectomy   . Heel spur surgery     No family history on file.  History  Substance Use Topics  . Smoking status: Current Everyday Smoker -- 0.2 packs/day  . Smokeless tobacco: Never Used  . Alcohol Use: Yes     occasional    OB History    Grav Para Term Preterm Abortions TAB SAB Ect Mult Living                  Review of Systems  Constitutional: Negative for fever and chills.  Respiratory:  Negative for shortness of breath.   Gastrointestinal: Negative for nausea and vomiting.  Skin:       Lesion on the right calf. Erythematous bumps around the torso and axilla.  Neurological: Negative for weakness.    Allergies  Penicillins  Home Medications  No current outpatient prescriptions on file.  BP 115/67  Pulse 92  Temp 98.2 F (36.8 C) (Oral)  Resp 18  SpO2 100%  LMP 01/28/2012  Physical Exam  Nursing note and vitals reviewed. Constitutional: She is oriented to person, place, and time. She appears well-developed and well-nourished. No distress.  HENT:  Head: Normocephalic and atraumatic.  Eyes: EOM are normal.  Neck: Neck supple. No tracheal deviation present.  Cardiovascular: Normal rate, regular rhythm and normal heart sounds.   Pulmonary/Chest: Effort normal and breath sounds normal. No respiratory distress. She has no wheezes.  Musculoskeletal: Normal range of motion.  Neurological: She is alert and oriented to person, place, and time.  Skin: Skin is warm and dry.       Right tib/fib region bilaterally: a 5 cm lesion with erythema but no pallor. In the middle there is a 2 cm area of increased hyperpigmentation with a pustule in the middle. Surrounding tenderness. Also presenting erythematous papular lesions, 4 on the torso, 1 in the left axilla with no  pustules and no surrounding erythema. They are tender to palpation.  Psychiatric: She has a normal mood and affect. Her behavior is normal.    ED Course  INCISION AND DRAINAGE Date/Time: 02/03/2012 4:44 PM Performed by: Elizabeth [name] Authorized by: Elizabeth [name] Consent: Verbal consent obtained. Risks and benefits: risks, benefits and alternatives were discussed Consent given by: patient Patient understanding: patient states understanding of the procedure being performed Patient identity confirmed: verbally with patient Type: abscess Body area: lower extremity Local anesthetic: lidocaine 1% with  epinephrine Anesthetic total: 4 ml Patient sedated: no Scalpel size: 11 Needle gauge: 22 Incision type: single straight Complexity: simple Drainage: purulent and serosanguinous Drainage amount: scant Wound treatment: wound left open and drain placed Packing material: 1/2 in iodoform gauze Patient tolerance: Patient tolerated the procedure well with no immediate complications.   (including critical care time) DIAGNOSTIC STUDIES: Oxygen Saturation is 100% on room air, normal by my interpretation.    COORDINATION OF CARE: 15:15--I evaluated the patient and we discussed a treatment plan including incision and drainage, antibiotics, and hydrocortizone creme to which the pt agreed.    Labs Reviewed - No data to display No results found.   No diagnosis found.    MDM  DDX: Abscess with cellulitis Healthy woman comes in with cc of insect bite. Has abscess -that we will drain. Some signs of surrounding cellulitis - will send home with AB.   Medical screening examination/treatment/procedure(s) were conducted by me as a physician. Scribe utilized for documentation purposes only.   Elizabeth Kaplan, MD 02/03/12 1645

## 2012-02-03 NOTE — ED Notes (Signed)
Pt reports possible insect bite noticed 3 days ago. Pt thought it was a mosquito bite and scratched it. Area enlarged, red area with white center.

## 2012-07-01 DIAGNOSIS — F1920 Other psychoactive substance dependence, uncomplicated: Secondary | ICD-10-CM | POA: Insufficient documentation

## 2012-07-01 DIAGNOSIS — T7421XA Adult sexual abuse, confirmed, initial encounter: Secondary | ICD-10-CM

## 2012-07-01 HISTORY — DX: Adult sexual abuse, confirmed, initial encounter: T74.21XA

## 2012-07-01 HISTORY — DX: Other psychoactive substance dependence, uncomplicated: F19.20

## 2012-08-06 ENCOUNTER — Emergency Department (HOSPITAL_COMMUNITY)
Admission: EM | Admit: 2012-08-06 | Discharge: 2012-08-07 | Disposition: A | Discharge status: Other Acute Inpatient Hospital | Payer: 59 | Attending: Emergency Medicine | Admitting: Emergency Medicine

## 2012-08-06 ENCOUNTER — Encounter (HOSPITAL_COMMUNITY): Payer: Self-pay | Admitting: *Deleted

## 2012-08-06 ENCOUNTER — Inpatient Hospital Stay (HOSPITAL_COMMUNITY)
Admission: RE | Admit: 2012-08-06 | Discharge: 2012-08-06 | Disposition: A | Discharge status: Home / Self Care | Payer: 59 | Attending: Psychiatry | Admitting: Psychiatry

## 2012-08-06 DIAGNOSIS — Z3202 Encounter for pregnancy test, result negative: Secondary | ICD-10-CM | POA: Insufficient documentation

## 2012-08-06 DIAGNOSIS — F29 Unspecified psychosis not due to a substance or known physiological condition: Secondary | ICD-10-CM

## 2012-08-06 DIAGNOSIS — F17200 Nicotine dependence, unspecified, uncomplicated: Secondary | ICD-10-CM | POA: Insufficient documentation

## 2012-08-06 DIAGNOSIS — Z8659 Personal history of other mental and behavioral disorders: Secondary | ICD-10-CM | POA: Insufficient documentation

## 2012-08-06 DIAGNOSIS — Z79899 Other long term (current) drug therapy: Secondary | ICD-10-CM | POA: Insufficient documentation

## 2012-08-06 DIAGNOSIS — R443 Hallucinations, unspecified: Secondary | ICD-10-CM

## 2012-08-06 DIAGNOSIS — F1410 Cocaine abuse, uncomplicated: Secondary | ICD-10-CM

## 2012-08-06 LAB — CBC WITH DIFFERENTIAL/PLATELET
Basophils Absolute: 0 10*3/uL (ref 0.0–0.1)
Basophils Relative: 0 % (ref 0–1)
MCHC: 36 g/dL (ref 30.0–36.0)
Monocytes Absolute: 0.6 10*3/uL (ref 0.1–1.0)
Neutro Abs: 4.2 10*3/uL (ref 1.7–7.7)
Neutrophils Relative %: 48 % (ref 43–77)
Platelets: 255 10*3/uL (ref 150–400)
RDW: 12.6 % (ref 11.5–15.5)

## 2012-08-06 LAB — COMPREHENSIVE METABOLIC PANEL
AST: 24 U/L (ref 0–37)
Albumin: 4.3 g/dL (ref 3.5–5.2)
Alkaline Phosphatase: 56 U/L (ref 39–117)
BUN: 6 mg/dL (ref 6–23)
Chloride: 102 mEq/L (ref 96–112)
Potassium: 3 mEq/L — ABNORMAL LOW (ref 3.5–5.1)
Total Bilirubin: 1.2 mg/dL (ref 0.3–1.2)

## 2012-08-06 LAB — URINALYSIS, ROUTINE W REFLEX MICROSCOPIC
Bilirubin Urine: NEGATIVE
Nitrite: NEGATIVE
Specific Gravity, Urine: 1.014 (ref 1.005–1.030)
Urobilinogen, UA: 1 mg/dL (ref 0.0–1.0)

## 2012-08-06 LAB — RAPID URINE DRUG SCREEN, HOSP PERFORMED
Barbiturates: NOT DETECTED
Cocaine: NOT DETECTED

## 2012-08-06 MED ORDER — THIAMINE HCL 100 MG/ML IJ SOLN: 100.0000 mg | Freq: Every day | INTRAMUSCULAR | Status: DC

## 2012-08-06 MED ORDER — FOLIC ACID 1 MG PO TABS
1.0000 mg | ORAL_TABLET | Freq: Every day | ORAL | Status: DC
  Administered 2012-08-06 – 2012-08-07 (×2): 1 mg via ORAL
  Filled 2012-08-06 (×2): qty 1

## 2012-08-06 MED ORDER — LORAZEPAM 1 MG PO TABS
0.0000 mg | ORAL_TABLET | Freq: Four times a day (QID) | ORAL | Status: DC
  Administered 2012-08-06: 1 mg via ORAL
  Filled 2012-08-06: qty 1

## 2012-08-06 MED ORDER — ACETAMINOPHEN 325 MG PO TABS: 650.0000 mg | ORAL_TABLET | ORAL | Status: DC | PRN

## 2012-08-06 MED ORDER — ZOLPIDEM TARTRATE 5 MG PO TABS: 5.0000 mg | ORAL_TABLET | Freq: Every evening | ORAL | Status: DC | PRN

## 2012-08-06 MED ORDER — ALUM & MAG HYDROXIDE-SIMETH 200-200-20 MG/5ML PO SUSP: 30.0000 mL | ORAL | Status: DC | PRN

## 2012-08-06 MED ORDER — ONDANSETRON HCL 4 MG PO TABS: 4.0000 mg | ORAL_TABLET | Freq: Three times a day (TID) | ORAL | Status: DC | PRN

## 2012-08-06 MED ORDER — LORAZEPAM 1 MG PO TABS: 0.0000 mg | ORAL_TABLET | Freq: Two times a day (BID) | ORAL | Status: DC

## 2012-08-06 MED ORDER — LORAZEPAM 2 MG/ML IJ SOLN: 1.0000 mg | Freq: Four times a day (QID) | INTRAMUSCULAR | Status: DC | PRN

## 2012-08-06 MED ORDER — ZIPRASIDONE MESYLATE 20 MG IM SOLR: 20.0000 mg | Freq: Once | INTRAMUSCULAR | Status: AC

## 2012-08-06 MED ORDER — ZIPRASIDONE MESYLATE 20 MG IM SOLR
INTRAMUSCULAR | Status: AC
  Administered 2012-08-06: 20 mg via INTRAMUSCULAR
  Filled 2012-08-06: qty 20

## 2012-08-06 MED ORDER — VITAMIN B-1 100 MG PO TABS
100.0000 mg | ORAL_TABLET | Freq: Every day | ORAL | Status: DC
  Administered 2012-08-06 – 2012-08-07 (×2): 100 mg via ORAL
  Filled 2012-08-06 (×2): qty 1

## 2012-08-06 MED ORDER — LORAZEPAM 1 MG PO TABS
1.0000 mg | ORAL_TABLET | Freq: Four times a day (QID) | ORAL | Status: DC | PRN
  Administered 2012-08-06 – 2012-08-07 (×2): 1 mg via ORAL
  Filled 2012-08-06 (×2): qty 1

## 2012-08-06 MED ORDER — POTASSIUM CHLORIDE CRYS ER 20 MEQ PO TBCR
40.0000 meq | EXTENDED_RELEASE_TABLET | Freq: Once | ORAL | Status: AC
  Administered 2012-08-06: 40 meq via ORAL
  Filled 2012-08-06: qty 2

## 2012-08-06 MED ORDER — IBUPROFEN 600 MG PO TABS
600.0000 mg | ORAL_TABLET | Freq: Three times a day (TID) | ORAL | Status: DC | PRN
  Administered 2012-08-07: 600 mg via ORAL
  Filled 2012-08-06: qty 1

## 2012-08-06 MED ORDER — NORETHIN ACE-ETH ESTRAD-FE 1-20 MG-MCG PO TABS: 1.0000 | ORAL_TABLET | Freq: Every day | ORAL | Status: DC

## 2012-08-06 MED ORDER — ADULT MULTIVITAMIN W/MINERALS CH
1.0000 | ORAL_TABLET | Freq: Every day | ORAL | Status: DC
  Administered 2012-08-06 – 2012-08-07 (×2): 1 via ORAL
  Filled 2012-08-06 (×2): qty 1

## 2012-08-06 MED ORDER — NICOTINE 21 MG/24HR TD PT24
21.0000 mg | MEDICATED_PATCH | Freq: Every day | TRANSDERMAL | Status: DC
  Administered 2012-08-06 – 2012-08-07 (×2): 21 mg via TRANSDERMAL
  Filled 2012-08-06 (×2): qty 1
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
## 2012-08-06 NOTE — ED Provider Notes (Signed)
History     CSN: [id number]  Arrival date & time 08/06/12  1658   First MD Initiated Contact with Patient 08/06/12 1801      Chief Complaint  Patient presents with  . Medical Clearance    (Consider location/radiation/quality/duration/timing/severity/associated sxs/prior treatment) HPI  Elizabeth [name] is a 22 y.o. female accompanied by mother who is presenting for detox from crack cocaine. Patient is non-cooperative and reluctant to give any history she is extremely angry. Mother is of little help. Patient is nonresponsive to most questions. She denies hallucination, hearing voices, suicidal ideation, homicidal ideation, she does endorse crack cocaine use she states that she drinks occasionally.  Past Medical History  Diagnosis Date  . Depression     Past Surgical History  Procedure Laterality Date  . Right foot surgery    . Abscess drainage      left groin  . Tonsillectomy and adenoidectomy    . Heel spur surgery      No family history on file.  History  Substance Use Topics  . Smoking status: Current Every Day Smoker -- 0.25 packs/day  . Smokeless tobacco: Never Used  . Alcohol Use: 2.4 oz/week    4 Glasses of wine per week     Comment: occasional    OB History   Grav Para Term Preterm Abortions TAB SAB Ect Mult Living                  Review of Systems  Constitutional: Negative for fever.  Respiratory: Negative for shortness of breath.   Cardiovascular: Negative for chest pain.  Gastrointestinal: Negative for nausea, vomiting, abdominal pain and diarrhea.  Psychiatric/Behavioral:        Crack cocaine abuse  All other systems reviewed and are negative.    Allergies  Penicillins  Home Medications   Current Outpatient Rx  Name  Route  Sig  Dispense  Refill  . norethindrone-ethinyl estradiol (JUNEL FE,GILDESS FE,LOESTRIN FE) 1-20 MG-MCG tablet   Oral   Take 1 tablet by mouth daily.           BP 130/69  Pulse 76  Temp(Src) 99.6 F (37.6 C)  (Oral)  Resp 16  SpO2 100%  LMP 08/05/2012  Physical Exam  Nursing note and vitals reviewed. Constitutional: She is oriented to person, place, and time. She appears well-developed and well-nourished. No distress.  HENT:  Head: Normocephalic.  Mouth/Throat: Oropharynx is clear and moist.  Eyes: Conjunctivae and EOM are normal. Pupils are equal, round, and reactive to light.  Neck: Normal range of motion.  Cardiovascular: Normal rate, regular rhythm and intact distal pulses.   Pulmonary/Chest: Effort normal. No stridor.  Abdominal: Soft.  Musculoskeletal: Normal range of motion.  Neurological: She is alert and oriented to person, place, and time.  Psychiatric: Her mood appears anxious. Her affect is labile. She is agitated. Thought content is paranoid. She expresses impulsivity and inappropriate judgment. She expresses no homicidal and no suicidal ideation. She expresses no suicidal plans and no homicidal plans. She is noncommunicative. She is inattentive.    ED Course  Procedures (including critical care time)  Labs Reviewed  URINALYSIS, ROUTINE W REFLEX MICROSCOPIC - Abnormal; Notable for the following:    APPearance CLOUDY (*)    Ketones, ur 40 (*)    All other components within normal limits  URINE RAPID DRUG SCREEN (HOSP PERFORMED) - Abnormal; Notable for the following:    Tetrahydrocannabinol POSITIVE (*)    All other components within normal limits  COMPREHENSIVE METABOLIC PANEL - Abnormal; Notable for the following:    Potassium 3.0 (*)    Glucose, Bld 118 (*)    All other components within normal limits  CBC WITH DIFFERENTIAL  ETHANOL  POCT PREGNANCY, URINE   No results found.   1. Cocaine abuse       MDM  None cooperative patient presenting for cocaine detox. Paperwork from behavioral health status she is also having auditory hallucinations which she denies.  Blood work is unremarkable except for mild hypokalemia of 3.0. I will replete her orally. Drug  screen is positive for THC. Patient is medically cleared for psychological evaluation.  Holding orders placed, home meds ordered, ACT consulted.          Wynetta Emery, PA-C 08/06/12 2222
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
## 2012-08-06 NOTE — ED Notes (Signed)
Patient was laying on the floor in the hallway wrapped in a blanket, Patient informed to get off of floor. Patient got off floor and went in her room.
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
## 2012-08-06 NOTE — ED Notes (Signed)
Pt reading signs in room, whispering to self. Pt laughs and sts "I'm so high."

## 2012-08-06 NOTE — Treatment Plan (Signed)
Pt presented to [facility] as a walk-in and was accepted pending medical clearance for detox of benzos, opioids, and etoh abuse.  Unfortunately, pt's UDS is negative for benzos, etoh, and pain pills.  She is only positive for THC.  Since being in the ED pt has been floridly psychotic which is a new problem for this pt and not consistent with withdrawal unless pt is experiencing delerium tremens which is a medical condition requiring IV fluid and ativan.  Therefore, at this point pt does not have a bed at [facility] until further evaluation and treatment.

## 2012-08-06 NOTE — ED Provider Notes (Signed)
Medical screening examination/treatment/procedure(s) were performed by non-physician practitioner and as supervising physician I was immediately available for consultation/collaboration.   [name], MD 08/06/12 2258

## 2012-08-06 NOTE — BHH Counselor (Signed)
*  [facility] requesting a dose of  potassium due to low levels.  Pt's nurse made aware and will request EDP to writer a order for potassium so that she may give to the patient.

## 2012-08-06 NOTE — ED Notes (Signed)
Patient stood up in the chair in her room. Patient was assisted by Clinical research associate down from chair. Patient was told that she could not stand up in chair. Furniture was removed from room. Patient was rambling and had pressured speech. Patient continued to try to walk in the hallways. Patient was redirected to her room.

## 2012-08-07 ENCOUNTER — Inpatient Hospital Stay (HOSPITAL_COMMUNITY)
Admission: AD | Admit: 2012-08-07 | Discharge: 2012-08-13 | DRG: 885 | Disposition: A | Discharge status: Home / Self Care | Payer: 59 | Source: Ambulatory Visit | Attending: Psychiatry | Admitting: Psychiatry

## 2012-08-07 ENCOUNTER — Encounter (HOSPITAL_COMMUNITY): Payer: Self-pay | Admitting: Behavioral Health

## 2012-08-07 DIAGNOSIS — F1920 Other psychoactive substance dependence, uncomplicated: Secondary | ICD-10-CM

## 2012-08-07 DIAGNOSIS — F3110 Bipolar disorder, current episode manic without psychotic features, unspecified: Principal | ICD-10-CM | POA: Diagnosis present

## 2012-08-07 DIAGNOSIS — F1994 Other psychoactive substance use, unspecified with psychoactive substance-induced mood disorder: Secondary | ICD-10-CM

## 2012-08-07 DIAGNOSIS — F1414 Cocaine abuse with cocaine-induced mood disorder: Secondary | ICD-10-CM | POA: Diagnosis present

## 2012-08-07 DIAGNOSIS — F1410 Cocaine abuse, uncomplicated: Secondary | ICD-10-CM | POA: Diagnosis present

## 2012-08-07 DIAGNOSIS — Z79899 Other long term (current) drug therapy: Secondary | ICD-10-CM

## 2012-08-07 HISTORY — DX: Cocaine abuse with cocaine-induced mood disorder: F14.14

## 2012-08-07 LAB — BASIC METABOLIC PANEL
BUN: 8 mg/dL (ref 6–23)
Chloride: 103 mEq/L (ref 96–112)
GFR calc Af Amer: >90 mL/min (ref >90)
GFR calc non Af Amer: >90 mL/min (ref >90)
Potassium: 3.9 mEq/L (ref 3.5–5.1)

## 2012-08-07 MED ORDER — NICOTINE 21 MG/24HR TD PT24
21.0000 mg | MEDICATED_PATCH | Freq: Every day | TRANSDERMAL | Status: DC
  Administered 2012-08-07: 21 mg via TRANSDERMAL
  Filled 2012-08-07 (×2): qty 1

## 2012-08-07 MED ORDER — MAGNESIUM HYDROXIDE 400 MG/5ML PO SUSP: 30.0000 mL | Freq: Every day | ORAL | Status: DC | PRN

## 2012-08-07 MED ORDER — TRAZODONE HCL 100 MG PO TABS
100.0000 mg | ORAL_TABLET | Freq: Every day | ORAL | Status: DC
  Administered 2012-08-07 – 2012-08-12 (×6): 100 mg via ORAL
  Filled 2012-08-07 (×7): qty 1

## 2012-08-07 MED ORDER — BENZTROPINE MESYLATE 1 MG PO TABS
1.0000 mg | ORAL_TABLET | Freq: Every day | ORAL | Status: DC
  Administered 2012-08-07: 1 mg via ORAL
  Filled 2012-08-07 (×5): qty 1

## 2012-08-07 MED ORDER — NORETHIN ACE-ETH ESTRAD-FE 1-20 MG-MCG PO TABS: 1.0000 | ORAL_TABLET | Freq: Every day | ORAL | Status: DC

## 2012-08-07 MED ORDER — ACETAMINOPHEN 325 MG PO TABS
650.0000 mg | ORAL_TABLET | Freq: Four times a day (QID) | ORAL | Status: DC | PRN
  Administered 2012-08-08 – 2012-08-10 (×3): 650 mg via ORAL

## 2012-08-07 MED ORDER — HALOPERIDOL 2 MG PO TABS
2.0000 mg | ORAL_TABLET | Freq: Every day | ORAL | Status: DC
  Administered 2012-08-07: 2 mg via ORAL
  Filled 2012-08-07 (×2): qty 1
  Filled 2012-08-07: qty 2

## 2012-08-07 MED ORDER — DIVALPROEX SODIUM ER 500 MG PO TB24
500.0000 mg | ORAL_TABLET | Freq: Two times a day (BID) | ORAL | Status: DC
  Administered 2012-08-07 – 2012-08-08 (×3): 500 mg via ORAL
  Filled 2012-08-07 (×7): qty 1

## 2012-08-07 MED ORDER — BENZTROPINE MESYLATE 1 MG/ML IJ SOLN
1.0000 mg | Freq: Every day | INTRAMUSCULAR | Status: DC
  Filled 2012-08-07 (×3): qty 1

## 2012-08-07 MED ORDER — ALUM & MAG HYDROXIDE-SIMETH 200-200-20 MG/5ML PO SUSP: 30.0000 mL | ORAL | Status: DC | PRN

## 2012-08-07 MED ORDER — RISPERIDONE 1 MG PO TBDP
1.0000 mg | ORAL_TABLET | Freq: Two times a day (BID) | ORAL | Status: DC
  Administered 2012-08-07: 1 mg via ORAL
  Filled 2012-08-07 (×5): qty 1

## 2012-08-07 MED ORDER — TRAZODONE HCL 50 MG PO TABS
50.0000 mg | ORAL_TABLET | Freq: Every day | ORAL | Status: DC
  Filled 2012-08-07 (×3): qty 1
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
## 2012-08-07 NOTE — Progress Notes (Signed)
Pt discharged from psych ED at this time. Pt alert and ambulatory.

## 2012-08-07 NOTE — ED Provider Notes (Addendum)
Patient seen in [facility].  Patient initially was accepted at [facility] as a walk-in for detox, but drug screen was negative and therefore not admitted. Patient is in the ER now because of acute psychosis which is apparently a new condition. There were issues raised about the possibility of delirium tremens requiring medical treatment. Patient's vital signs, however patient's vital signs do not suggest delirium tremens. Patient is sleeping, resting comfortably without issues.  This also makes delirium tremens unlikely. Awaiting placement/disposition.   Addendum: Patient has now been accepted at [facility].  Gilda Crease, MD 08/07/12 [id number]  Gilda Crease, MD 08/07/12 [phone]

## 2012-08-07 NOTE — BH Assessment (Signed)
BHH Assessment Progress Note      Reviewed with Elizabeth [name] vital signs since there had been a concern she may be having delirium tremens. Vitals are stable, no indication of delirium per EDP. Accepted to 403 bed 2

## 2012-08-07 NOTE — Progress Notes (Signed)
Pt reports she came to [facility] for detox.  Since coming on the unit, she has shown to signs of withdrawal.  She becomes angry easily.  Since 1900, she has been on the phone several times, with a couple of times becoming angry with whom she was talking.  At one point, she tore the wallpaper from the wall near the telephone in the dayroom.  She has received her 2000 depakote 100 mg and congentin 1mg .  Staff so far has been able to redirect her.  She denies SI/HI/AV.  Pt makes her needs known to staff.  Support/encouragement given.  Safety maintained with q15 minute checks.

## 2012-08-07 NOTE — Tx Team (Signed)
Initial Interdisciplinary Treatment Plan  PATIENT STRENGTHS: (choose at least two) Ability for insight Motivation for treatment/growth  PATIENT STRESSORS: Financial difficulties Substance abuse   PROBLEM LIST: Problem List/Patient Goals Date to be addressed Date deferred Reason deferred Estimated date of resolution  Bipolar  08/07/12     Substance abuse 08/07/12     SI 08/07/12                                          DISCHARGE CRITERIA:  Ability to meet basic life and health needs Improved stabilization in mood, thinking, and/or behavior Verbal commitment to aftercare and medication compliance  PRELIMINARY DISCHARGE PLAN: Attend aftercare/continuing care group Attend PHP/IOP  PATIENT/FAMIILY INVOLVEMENT: This treatment plan has been presented to and reviewed with the patient, Elby Beck, and/or family member.  The patient and family have been given the opportunity to ask questions and make suggestions.  White, Patrice L 08/07/2012, 3:18 PM
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
## 2012-08-07 NOTE — Progress Notes (Signed)
Pt up this morning, alert and oriented. Pt spoke about wanting to go to [facility] for detox. Pt spoke about her addictions and stated they were sex, marijuana and her latest addiction is cocaine. Pt did request and receive ativan and motrin as was complaining of pain and anxiety.

## 2012-08-07 NOTE — BHH Counselor (Signed)
Patient accepted to [facility] and the accepting physician is Dr. Thedore Mins. The room assignment is 403-2. The EDP has been informed of the disposition and has placed patient up for transfer. Patients nurse was also informed and will call report # is [phone]. Support paperwork completed and faxed to [facility]. Patient is under IVC and will be transported to [facility] via [facility].

## 2012-08-07 NOTE — ED Notes (Signed)
Patient squirted hand sanitizer in her hand and rubbed it in her hair. Hand sanitizers removed from the wall on hallway with rooms 38- 43 for patient's protection.

## 2012-08-08 MED ORDER — NICOTINE POLACRILEX 2 MG MT GUM
2.0000 mg | CHEWING_GUM | OROMUCOSAL | Status: DC | PRN
  Administered 2012-08-08 (×2): 2 mg via ORAL

## 2012-08-08 MED ORDER — DIVALPROEX SODIUM ER 250 MG PO TB24
750.0000 mg | ORAL_TABLET | Freq: Two times a day (BID) | ORAL | Status: DC
  Administered 2012-08-08 – 2012-08-09 (×2): 750 mg via ORAL
  Filled 2012-08-08 (×4): qty 1

## 2012-08-08 MED ORDER — LORAZEPAM 1 MG PO TABS
1.0000 mg | ORAL_TABLET | Freq: Four times a day (QID) | ORAL | Status: DC | PRN
  Administered 2012-08-08 – 2012-08-09 (×3): 1 mg via ORAL
  Filled 2012-08-08 (×3): qty 1

## 2012-08-08 MED ORDER — CLONIDINE HCL 0.1 MG PO TABS: 0.1000 mg | ORAL_TABLET | Freq: Three times a day (TID) | ORAL | Status: DC | PRN
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
## 2012-08-08 NOTE — Progress Notes (Signed)
[facility] Group Notes:  (Nursing/MHT/Case Management/Adjunct)  Date:  08/08/2012  Time:  9:44 AM  Type of Therapy:  Discharge Planning  Participation Level:  Active  Participation Quality:  Attentive, Redirectable and Sharing  Affect:  Blunted, Defensive and Labile  Cognitive:  Disorganized  Insight:  Limited  Engagement in Group:  Defensive and Monopolizing  Modes of Intervention:  Discussion, Exploration and Limit-setting  Summary of Progress/Problems:  Elizabeth [name] attended group sharing she has been to over 50 places like this one and this is not her first. She is labile in mood AEB times of being friendly with other members and then becoming defensive and agitated/takes offense to comments about Native American Indians; a comment from another member.  She shares she is just here for drug detox, but does not know why her mother continues to cock block her as she does not have a drug problem.  She joins in and out of the conversation stating she wants drug treatment rehab at [facility] stating she just loves the state of [place].  She does not know what she will do after this stating her mom is not supportive but does have a boyfriend.  Nail, Catalina Gravel 08/08/2012, 9:44 AM

## 2012-08-08 NOTE — Progress Notes (Signed)
The focus of this group is to help patients review their daily goal of treatment and discuss progress on daily workbooks. Pt attended the evening wrap-up group and responded to discussion prompts with very limited answers. Pt stated that today was good because she was able to rest and that getting more rest was her only need for the evening. Pt was a little intrusive when others were speaking, but redirected easily. Pt did not appear engaged in the group and left the room several times.

## 2012-08-08 NOTE — Progress Notes (Signed)
Psychoeducational Group Note  Date:  08/08/2012 Time:  0930  Group Topic/Focus:  Overcoming Stress:   The focus of this group is to define stress and help patients assess their triggers.  Participation Level: Did Not Attend  Participation Quality:  Not Applicable  Affect:  Not Applicable  Cognitive:  Not Applicable  Insight:  Not Applicable  Engagement in Group: Not Applicable  Additional Comments:  Pt refused to attend group this morning.  TRINITY, JOEL E 08/08/2012, 2:35 PM

## 2012-08-08 NOTE — [facility] Suicide Risk Assessment (Signed)
Suicide Risk Assessment  Admission Assessment     Nursing information obtained from:  Patient Demographic factors:  Adolescent or young adult;Low socioeconomic status Current Mental Status:  NA Loss Factors:  Financial problems / change in socioeconomic status Historical Factors:  Impulsivity Risk Reduction Factors:  Sense of responsibility to family;Employed;Positive social support  CLINICAL FACTORS:   Bipolar Disorder:   Manic Alcohol/Substance Abuse/Dependencies Previous Psychiatric Diagnoses and Treatments  COGNITIVE FEATURES THAT CONTRIBUTE TO RISK:  Closed-mindedness Polarized thinking    SUICIDE RISK:   Minimal: No identifiable suicidal ideation.  Patients presenting with no risk factors but with morbid ruminations; may be classified as minimal risk based on the severity of the depressive symptoms  PLAN OF CARE:1. Admit for crisis management and stabilization. 2. Medication management to reduce current symptoms to base line and improve the patient's overall level of functioning 3. Treat health problems as indicated. 4. Develop treatment plan to decrease risk of relapse upon discharge and the need for readmission. 5. Psycho-social education regarding relapse prevention and self care. 6. Health care follow up as needed for medical problems. 7. Restart home medications where appropriate.   I certify that inpatient services furnished can reasonably be expected to improve the patient's condition.  Akintayo, Mojeed,MD 08/08/2012, 10:56 AM

## 2012-08-08 NOTE — Progress Notes (Signed)
Patient resting quietly with eyes closed. Respirations even and unlabored. No distress noted . Q 15 minute check continues as ordered to maintain safety. 

## 2012-08-08 NOTE — H&P (Signed)
Seen and agreed. Mojeed Akintayo, MD 

## 2012-08-08 NOTE — Progress Notes (Signed)
Patient ID: Elizabeth [name], female   DOB: August 05, 1990, 22 y.o.   MRN: [id number] D: Pt mood/affect is depressed and flat. Pt c/o agitation after a phone call with boyfriend.  Pt denies suicidal ideation and homicidal ideation. Pt attended evening karaoke group and Interacted appropriately with peers. Cooperative with assessment. No acute distressed noted at this time.   A: Met with pt 1:1. Medications administered as prescribed. Writer encouraged pt to discuss feelings. Writer encourage pt to minimize calls to boyfriend. Pt insisted calling boyfriend to apologize. Pt encouraged to come to staff with any question or concerns.  R: Patient remains safe. She is complaint with medications and group programming. Continue current POC.

## 2012-08-08 NOTE — Clinical Social Work Note (Signed)
[facility] LCSW Group Therapy  08/08/2012  1:15 PM   Type of Therapy:  Group Therapy  Participation Level:  Active  Participation Quality:  Appropriate, Attentive, Sharing and Supportive  Affect:  Appropriate and Irritable  Cognitive:  Alert and Appropriate  Insight:  Developing/Improving and Engaged  Engagement in Therapy:  Developing/Improving and Engaged  Modes of Intervention:  Clarification, Confrontation, Discussion, Education, Exploration, Limit-setting, Orientation, Problem-solving, Rapport Building, Role-play, Socialization and Support  Summary of Progress/Problems: The topic for group was balance in life.  Pt participated in the discussion about when their life was in balance and out of balance and how this feels.  Pt discussed ways to get back in balance and short term goals they can work on to get where they want to be.  Pt shared that she is here to detox from cocaine.  Pt explained that she works at [employer] and is worried about her job currently.  Pt states that she is also in pain all over and doesn't know why.  Pt contributed to the group discussion about balance in life by explaining that her life is currently unbalanced due to being here and a balanced self would have her hair done, nice outfit and accessories and heels on.  Pt states that she wants to go to [facility], as she has been there before.  Pt was in and out of group multiple times and had to be asked to stay in her seat for group.    Elizabeth [name], LCSWA 08/08/2012 2:19 PM

## 2012-08-08 NOTE — Progress Notes (Addendum)
D:  Patient's self inventory sheet, patient needs sleep medication, has improving appetite, low energy level, poor attention span.  Denied depression and hopelessness.  Has experienced sedation, chilling and agitation in past 24 hours. Denied SI.  Has experienced lightheadedness, pain, dizziness, rash, blurred vision in past 24 hours.  Worst pain #7.  After discharge, plans to change environment, makes new friends.  No problems taking medications after discharge. A:  Medications administered per MD order.  Support and encouragement given to patient throughout day.  1:1 time spent with patient. R:  Following treatment plan.  Denied SI and HI.  Denied A/V hallucinations.  Contracts for safety.  Patient received nicotine patch last night, stated the patch was not working and removed patch.  New order for nicotine gum per instructions placed.   Patient stated gum is working for her. PO medication cogentin 1 mg  given this morning.  No IM injection needed to be given to patient.

## 2012-08-09 MED ORDER — HYDROXYZINE HCL 50 MG PO TABS
50.0000 mg | ORAL_TABLET | Freq: Four times a day (QID) | ORAL | Status: DC | PRN
  Administered 2012-08-09 – 2012-08-13 (×9): 50 mg via ORAL

## 2012-08-09 MED ORDER — DIVALPROEX SODIUM ER 500 MG PO TB24
1000.0000 mg | ORAL_TABLET | Freq: Two times a day (BID) | ORAL | Status: DC
  Administered 2012-08-09 – 2012-08-13 (×8): 1000 mg via ORAL
  Filled 2012-08-09 (×11): qty 2

## 2012-08-09 NOTE — Progress Notes (Signed)
Adult Psychoeducational Group Note  Date:  08/09/2012 Time:  9:32 AM  Group Topic/Focus:  Goals Group:   The focus of this group is to help patients establish daily goals to achieve during treatment and discuss how the patient can incorporate goal setting into their daily lives to aide in recovery.  Participation Level:  Minimal  Participation Quality:  Inattentive  Affect:  Angry and Anxious  Cognitive:  Alert and Appropriate  Insight: Appropriate  Engagement in Group:  Lacking and Off Topic  Modes of Intervention:  Discussion and Support  Additional Comments:  Patient did not engage during group. Patient states that she was only present in group because she wants to be able to call her boyfriend afterward. When asked for her goal, the patient reluctantly stated that her goal is to work on being able to go to [employer]." RN referred patient to case management for additional questions.  Nestor Ramp [facility] 08/09/2012, 9:32 AM

## 2012-08-09 NOTE — Progress Notes (Signed)
Patient ID: Elizabeth [name], female   DOB: March 01, 1991, 22 y.o.   MRN: [id number]  D: Pt denies SI/HI/AVH. Pt is pleasant and cooperative. Pt can become very volatile. Pt was upset and agitated earlier after speaking to BF mother. Pt had to be consoled to keep from escalating out of control. Pt easily gets upset at situations and or people, but pt is redirectable and willing to listen. Pt states she is ready to leave and says she will feel very hostile if she does not leave soon.    A: Pt was offered support and encouragement. Pt was given scheduled medications. Pt was encourage to attend groups. Q 15 minute checks were done for safety. Pt given PRN vistaril 2030 for anxiety.    R:Pt attends groups and interacts  with peers and staff. Pt is taking medication.Pt receptive to treatment and safety maintained on unit.

## 2012-08-09 NOTE — Progress Notes (Signed)
Recreation Therapy Notes  Date: 03.07.2014 Time: 9:30am Location: [address] Day Room  Group Topic/Focus: Stress Managment   Participation Level:  Active   Participation Quality:  Other:Initially appropriate then agressive towards peer.  Affect:  Euthymic to Angry, Irritable and Labile   Cognitive:  Oriented    Additional Comments: Patient participated in stress management exercises. Patient rated stress level at 10 on scale of 10 before participating in stress management techniques. Patient practiced deep breathing for approximately 5 minutes. Patient practiced progressive muscle relaxation for approximately 20 minutes. After completing stress management techniques patient rated stress level at a 5 out of 10. Patient stated several times "I just can't wait to get out of here." Patient stated stress management techniques would help her reduce her stress know that she knew how to them.   During wrap up discussion patient suggested medication that peer could take to reduce his anxiety, peer stated he does not want to be medicated. Patient escalated interaction by stating "I know your people, you think you know everything. Why don't you just shut the fuck up." LRT attempted to diffuse the situation by explaining to patient that peer was not trying to be offensive. Patient continued to state that peer did not know everything and that she can't wait to be discharged from hospital. Patient used raised voice and explicative's while interacting with peer. Patient then stated she could no longer be in group space and that she was leaving.    Jearl Klinefelter, LRT/ CTRS   Jearl Klinefelter 08/09/2012 12:06 PM

## 2012-08-09 NOTE — Progress Notes (Signed)
Adult Psychoeducational Group Note  Date:  08/09/2012 Time:  9:26 PM  Group Topic/Focus:  Wrap-Up Group:   The focus of this group is to help patients review their daily goal of treatment and discuss progress on daily workbooks.  Participation Level:  Active  Participation Quality:  Appropriate  Affect:  Appropriate  Cognitive:  Appropriate  Insight: Appropriate  Engagement in Group:  Engaged  Modes of Intervention:  Support  Additional Comments:  Patient attended and participated in group tonight. She reports that after waking up today she take care of her personal needs, had her vitals taken, attended all her meals and groups. She spoke with her doctor whom advise her that he will keep her here under observation for 72 hours to monitor her medications. Today she rested.  Elizabeth [name] [facility] 08/09/2012, 9:26 PM

## 2012-08-09 NOTE — Progress Notes (Signed)
D: Patient denies SI/HI and auditory and visual hallucinations. The patient has an anxious and irritable mood and affect. The patient appears agitated at times on the unit and states that she has "a short fuse." The patient is attending groups and interacting appropriately during them. The patient is requesting vistaril instead of lorazepam prn.  A: Patient given emotional support from RN. Patient encouraged to come to staff with concerns and/or questions. Patient's medication routine continued. Patient's orders and plan of care reviewed. Per MD orders, RN changed lorazepam to vistaril prn.  R: Patient remains cooperative. Will continue to monitor patient q15 minutes for safety.

## 2012-08-09 NOTE — Progress Notes (Signed)
Patient ID: Elizabeth [name], female   DOB: [birth year], 22 y.o.   MRN: [id number] [facility] MD Progress Note  08/09/2012 12:54 PM Elizabeth [name]  MRN:  [id number]  Subjective: "I have a short fuse and I am irritated right now". Objective: Patient reports ongoing mood lability and difficulty getting along with the staffs and others. She  remains hostile,  gets easily agitated and disrespectful. Patient is intrusive and needs constant re-direction. She denies psychosis/delusions, suicide and homicidal ideation, intent or plan.  Diagnosis:  Axis I: Bipolar I disorder, most recent episode (or current) manic                                Polysubstance dependence  Axis II: Cluster B traits  ADL's:  Intact  Sleep: fair  Appetite:  Fair  Suicidal Ideation: denies Plan:  denies Intent:  denies Means:  denies Homicidal Ideation: Yes, patient reports that she would like to hurt her ex-boyfriend for given her Chlamydia Plan:  denies Intent:  denies Means:  denies AEB (as evidenced by):  Psychiatric Specialty Exam: Review of Systems  Constitutional: Negative.   HENT: Negative.   Eyes: Negative.   Respiratory: Negative.   Cardiovascular: Negative.   Gastrointestinal: Negative.   Genitourinary: Negative.   Musculoskeletal: Negative.   Skin: Negative.   Neurological: Negative.   Psychiatric/Behavioral: Positive for substance abuse. The patient is nervous/anxious and has insomnia.     Blood pressure 102/72, pulse 110, temperature 97.3 F (36.3 C), temperature source Oral, resp. rate 18, height 5\' 5"  (1.651 m), weight 67.359 kg (148 lb 8 oz), last menstrual period 08/05/2012.Body mass index is 24.71 kg/(m^2).  General Appearance: Fairly Groomed  Patent attorney::  Fair  Speech:  Pressured  Volume:  Increased  Mood:  Angry and Irritable  Affect:  Labile and Full Range  Thought Process:  Loose and Tangential  Orientation:  Full (Time, Place, and Person)  Thought Content:  Negative  Suicidal  Thoughts:  No  Homicidal Thoughts:  No  Memory:  Immediate;   Fair Recent;   Fair Remote;   Fair  Judgement:  Poor  Insight:  Lacking  Psychomotor Activity:  Increased  Concentration:  Poor  Recall:  Fair  Akathisia:  No  Handed:  Right  AIMS (if indicated):     Assets:  Communication Skills Desire for Improvement Physical Health  Sleep:  Number of Hours: 5.25   Current Medications: Current Facility-Administered Medications  Medication Dose Route Frequency Provider Last Rate Last Dose  . acetaminophen (TYLENOL) tablet 650 mg  650 mg Oral Q6H PRN Verne Spurr, PA-C   650 mg at 08/08/12 2011  . alum & mag hydroxide-simeth (MAALOX/MYLANTA) 200-200-20 MG/5ML suspension 30 mL  30 mL Oral Q4H PRN Verne Spurr, PA-C      . cloNIDine (CATAPRES) tablet 0.1 mg  0.1 mg Oral Q8H PRN Mojeed Akintayo      . divalproex (DEPAKOTE ER) 24 hr tablet 1,000 mg  1,000 mg Oral BID Mojeed Akintayo      . hydrOXYzine (ATARAX/VISTARIL) tablet 50 mg  50 mg Oral Q6H PRN Mojeed Akintayo   50 mg at 08/09/12 1142  . magnesium hydroxide (MILK OF MAGNESIA) suspension 30 mL  30 mL Oral Daily PRN Verne Spurr, PA-C      . nicotine polacrilex (NICORETTE) gum 2 mg  2 mg Oral Q2H PRN Verne Spurr, PA-C   2 mg at 08/08/12 1404  .  norethindrone-ethinyl estradiol (JUNEL FE,GILDESS FE,LOESTRIN FE) 1-20 MG-MCG per tablet 1 tablet  1 tablet Oral Daily Verne Spurr, PA-C      . traZODone (DESYREL) tablet 100 mg  100 mg Oral QHS Kerry Hough, PA-C   100 mg at 08/08/12 2157    Lab Results:  No results found for this or any previous visit (from the past 48 hour(s)).  Physical Findings: AIMS: Facial and Oral Movements Muscles of Facial Expression: None, normal Lips and Perioral Area: None, normal Jaw: None, normal Tongue: None, normal,Extremity Movements Upper (arms, wrists, hands, fingers): None, normal Lower (legs, knees, ankles, toes): None, normal, Trunk Movements Neck, shoulders, hips: None, normal, Overall  Severity Severity of abnormal movements (highest score from questions above): None, normal Incapacitation due to abnormal movements: None, normal Patient's awareness of abnormal movements (rate only patient's report): No Awareness, Dental Status Current problems with teeth and/or dentures?: No Does patient usually wear dentures?: No  CIWA:  CIWA-Ar Total: 0 COWS:  COWS Total Score: 2  Treatment Plan Summary: Daily contact with patient to assess and evaluate symptoms and progress in treatment Medication management  Plan: 1. Will increased Depakote ER to 1000mg  po BID for labile mood 2. Patient encouraged to attend group and other unit milieu. 3 Patient needs drug rehab program upon discharge 4. ELOS 3-5 days 5. Valproic acid level on Monday 08/12/12.  Medical Decision Making Problem Points:  Established problem, worsening (2), Review of last therapy session (1) and Review of psycho-social stressors (1) Data Points:  Order Aims Assessment (2) Review of medication regiment & side effects (2) Review of new medications or change in dosage (2)  I certify that inpatient services furnished can reasonably be expected to improve the patient's condition.   Akintayo, Mojeed,MD 08/09/2012, 12:54 PM

## 2012-08-09 NOTE — Progress Notes (Signed)
Pt didn't attend karaoke group this evening.

## 2012-08-10 DIAGNOSIS — F1920 Other psychoactive substance dependence, uncomplicated: Secondary | ICD-10-CM

## 2012-08-10 MED ORDER — PSEUDOEPHEDRINE HCL ER 120 MG PO TB12
120.0000 mg | ORAL_TABLET | Freq: Every day | ORAL | Status: DC
  Administered 2012-08-10 – 2012-08-13 (×4): 120 mg via ORAL
  Filled 2012-08-10 (×6): qty 1

## 2012-08-10 MED ORDER — NICOTINE 14 MG/24HR TD PT24
MEDICATED_PATCH | TRANSDERMAL | Status: AC
  Administered 2012-08-10: 14 mg via TRANSDERMAL
  Filled 2012-08-10: qty 1

## 2012-08-10 MED ORDER — LORATADINE 10 MG PO TABS
10.0000 mg | ORAL_TABLET | Freq: Every day | ORAL | Status: DC
  Administered 2012-08-10 – 2012-08-13 (×4): 10 mg via ORAL
  Filled 2012-08-10 (×6): qty 1

## 2012-08-10 MED ORDER — NICOTINE 14 MG/24HR TD PT24
14.0000 mg | MEDICATED_PATCH | Freq: Every day | TRANSDERMAL | Status: DC
  Administered 2012-08-11: 14 mg via TRANSDERMAL
  Filled 2012-08-10 (×3): qty 1

## 2012-08-10 MED ORDER — IBUPROFEN 400 MG PO TABS
400.0000 mg | ORAL_TABLET | Freq: Three times a day (TID) | ORAL | Status: DC | PRN
  Administered 2012-08-10: 400 mg via ORAL
  Filled 2012-08-10: qty 1

## 2012-08-10 NOTE — Clinical Social Work Note (Signed)
[facility] Group Notes:  (Clinical Social Work)  08/10/2012  11:15-11:45AM  Summary of Progress/Problems:   The main focus of today's process group was for the patient to identify ways in which they have in the past sabotaged their own recovery and reasons they may have done this/what they received from doing it.  We then worked to identify a specific plan to avoid doing this when discharged from the hospital for this admission.  The patient expressed that she wants to stay away from powder cocaine and get well.  When asked by writer if she uses powder cocaine or crack, she acted offended and said "Powder, of course, my father was a crackhead and I didn't want him to use drugs, I would never do that."  She said several times she wants to get well and take her medicines.  She stated she had been using marijuana, and the high from that was no longer enough, so she started using cocaine since it was stronger.  Later, she stated she only uses cocaine in celebration.  Her thought processes were not connected.  Type of Therapy:  Group Therapy - Process  Participation Level:  Active  Participation Quality:  Attentive and Sharing  Affect:  Blunted  Cognitive:  Disorganized  Insight:  Limited  Engagement in Therapy:  Improving  Modes of Intervention:  Clarification, Education, Limit-setting, Problem-solving, Socialization, Support and Processing, Exploration, Discussion   Ambrose Mantle, LCSW 08/10/2012, 11:45 AM

## 2012-08-10 NOTE — Progress Notes (Signed)
Patient ID: Elizabeth [name], female   DOB: 27-Jul-1990, 22 y.o.   MRN: [id number] Psychoeducational Group Note  Date:  08/10/2012 Time:0900am  Group Topic/Focus:  Identifying Needs:   The focus of this group is to help patients identify their personal needs that have been historically problematic and identify healthy behaviors to address their needs.  Participation Level:  Active  Participation Quality:  Appropriate  Affect:  Appropriate  Cognitive:  Appropriate  Insight:  Supportive  Engagement in Group:  Supportive  Additional Comments: inventory group   Valente David 08/10/2012,9:38 AM
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
## 2012-08-10 NOTE — Progress Notes (Signed)
D   Pt has been cooperative and pleasant on approach   She is compliant with treatment   She can be childlike at times doing silly things to get attention A   Verbal support given  Medications administered and effectiveness monitored  Q 15 min checks R   Pt safe at present

## 2012-08-10 NOTE — Progress Notes (Signed)
At approximately 0935 pt said she fell while trying to get to the phone   She said she just jumped up in a hurry and slipped on a blanket  She was laughing after it happened  She said she hit her hip arm and leg  Upon examination I did not see any reddened areas or bruising  Her bp sitting 107/70 p 80 standing 99/62 p 101   Informed pa Lloyd Huger received orders  Cautioned pt about making position changes slowly and other safety issues  Pt verbalized understanding and is presently safe
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
## 2012-08-10 NOTE — Progress Notes (Signed)
Psychoeducational Group Note  Date:  08/10/2012 Time:  0945 am  Group Topic/Focus:  Identifying Needs:   The focus of this group is to help patients identify their personal needs that have been historically problematic and identify healthy behaviors to address their needs.  Participation Level:  Active  Participation Quality:  Drowsy  Affect:  Appropriate  Cognitive:  Appropriate  Insight:  Improving  Engagement in Group:  Improving  Additional Comments:    Andrena Mews 08/10/2012,12:43 PM

## 2012-08-11 MED ORDER — VITAMIN B-12 100 MCG PO TABS
100.0000 ug | ORAL_TABLET | Freq: Every day | ORAL | Status: DC
  Administered 2012-08-11 – 2012-08-13 (×3): 100 ug via ORAL
  Filled 2012-08-11 (×4): qty 1

## 2012-08-11 MED ORDER — ADULT MULTIVITAMIN W/MINERALS CH
1.0000 | ORAL_TABLET | Freq: Every day | ORAL | Status: DC
  Administered 2012-08-11 – 2012-08-13 (×3): 1 via ORAL
  Filled 2012-08-11 (×4): qty 1

## 2012-08-11 NOTE — Progress Notes (Signed)
D- Elizabeth [name] is out in milieu interacting with peers and attending groups.  She denies SI/HI or any A/V hallucinations.  No physical complaints and compliant with medications.  A- Support and encouragement given.  Medication education done and patient verbalized understanding.  Continue current POC and evaluation of treatment goals.  Continue 15' checks for safety.  R- Remains safe on the unit.
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
## 2012-08-11 NOTE — Clinical Social Work Note (Signed)
[facility] Group Notes:  (Clinical Social Work)  08/11/2012   11:15-11:45AM  Summary of Progress/Problems:  The main focus of today's process group was to listen to a variety of genres of music and to identify that different types of music provoke different responses.  The patient then was able to identify personally what was soothing for them, as well as energizing.  Handouts were used to record feelings evoked, as well as how patient can personally use this knowledge in sleep habits, with depression, and with other symptoms.  The patient expressed enjoyment of music played during group, and talked of how each kind affected her emotions.  Was seen to be supportive of another group member not feeling well.  Type of Therapy:  Music Therapy with processing done  Participation Level:  Active  Participation Quality:  Appropriate, Attentive, Sharing and Supportive  Affect:  Appropriate  Cognitive:  Alert, Appropriate and Oriented  Insight:  Engaged  Engagement in Therapy:  Engaged  Modes of Intervention:   Socialization, Support and Processing, Exploration, Education, Rapport Building   [employer], LCSW 08/11/2012, 12:05 PM

## 2012-08-11 NOTE — Progress Notes (Addendum)
Pt. approach RN and informed her that she just had an argument with her parents. RN encouraged her to use deep breathing technique as a way to manage her anxiety and frustrations.  After utilizing deep breathing techniques, pt requested her PRN anxiety medication. Pt. Reports anxiety level @10 .  Pt does present with anxiety, cooperative and anxious RN administered her medications as per ordered. Rn provided support, empathy and skill building activity and guidance throughout contact.  RN will ensure 15 minutes checks are being performed along with providing appropriate PRN nursing interventions.

## 2012-08-12 DIAGNOSIS — F319 Bipolar disorder, unspecified: Secondary | ICD-10-CM

## 2012-08-12 DIAGNOSIS — F1910 Other psychoactive substance abuse, uncomplicated: Secondary | ICD-10-CM

## 2012-08-12 LAB — VALPROIC ACID LEVEL: Valproic Acid Lvl: 103.9 ug/mL — ABNORMAL HIGH (ref 50.0–100.0)

## 2012-08-12 MED ORDER — NICOTINE 21 MG/24HR TD PT24
21.0000 mg | MEDICATED_PATCH | Freq: Every day | TRANSDERMAL | Status: DC
  Administered 2012-08-12 – 2012-08-13 (×2): 21 mg via TRANSDERMAL
  Filled 2012-08-12 (×4): qty 1

## 2012-08-12 NOTE — Progress Notes (Signed)
Pt observed in the dayroom watching TV and interacting with her peers.  She reports she has had a good day.  She says she is probably going to [facility] [place]) some time this week for rehab.  She feels this will be good for her.  She denies SI/HI/AV.  She voices no needs/concerns at this time.  Her actions are appropriate at this time.  Pt makes her needs known to staff.  She is med compliant.  Support/encouragement given.  Safety maintained with q15 minute checks.

## 2012-08-12 NOTE — Progress Notes (Signed)
[facility] LCSW Aftercare Discharge Planning Group Note  08/12/2012 9:11 AM  Participation Quality:  Appropriate  Affect:  Appropriate  Cognitive:  Oriented  Insight:  Improving  Engagement in Group:  Engaged  Modes of Intervention:  Discussion, Exploration and Socialization  Summary of Progress/Problems:Pierra states she wants to go to [facility] [place], where she was back in 12.  But she needs to be able to attend on-line classes while there.  If not there, will return home with mother and focus on her sobriety, a part time job and school.  She signed a release for [facility].  Daryel Gerald B 08/12/2012, 9:11 AM

## 2012-08-12 NOTE — Progress Notes (Signed)
Recreation Therapy Notes  Date: 03.10.2014 Time: 9:30am Location: [address] Day Room      Group Topic/Focus: Leisure Education  Participation Level: Active  Participation Quality: Appropriate  Affect: Angry  Cognitive: Appropriate   Additional Comments: Patient with peers played adapted Boggle. Patients individually drew letter of the alphabet out of a container. Patient with peers names three recreation and leisure activities per letter. Patient with peers were able to list recreation and leisure activities for each letter of the alphabet.   Patient actively participated in group activity. Patient needed prompt to not have side conversations with female peers in group. Patient affect appears angry, patient body language is closed, however patient tone of voice when speaking is calm and not hostile. Patient smiled and laughed during group. Patient stated "I just want to feel better" during wrap up group. Patient asked LRT about the [facility] in [place]. LRT told patient LRT is not familiar with organization, but to ask LCSW to investigate services there.    Marykay Lex Blanchfield, LRT/CTRS  Blanchfield, Denise L 08/12/2012 11:51 AM

## 2012-08-12 NOTE — Progress Notes (Signed)
Patient ID: Elizabeth [name], female   DOB: 03/15/1991, 22 y.o.   MRN: [id number] D: No new data from previous assessment. Patient awake in bed. Pt has no complaints at this time. No sign of distress noted. A: 15 mins checks for  Safety. R: Pt is safe.

## 2012-08-12 NOTE — Progress Notes (Signed)
Patient ID: Elizabeth [name], female   DOB: 1990-11-19, 22 y.o.   MRN: [id number] [facility] MD Progress Note  08/12/2012 10:27 AM LAVEDA DEMEDEIROS  MRN:  [id number]  Subjective: "I am waiting to get into a rehab program". Objective: Patient reports decreased agitation and labile mood but her behavior is unpredictable. She verbalizes decreased craving for drugs and alcohol but reports a strong craving for cigarettes. She denies psychosis, delusions, suicide, homicidal ideation, intent or plan and adverse reactions to her medications. Valproic acid level: 103.9  Diagnosis:  Axis I: Bipolar I disorder, most recent episode (or current) manic                                Polysubstance dependence  Axis II: Cluster B traits  ADL's:  Intact  Sleep: fair  Appetite:  Fair  Suicidal Ideation: denies Plan:  denies Intent:  denies Means:  denies Homicidal Ideation: Yes, patient reports that she would like to hurt her ex-boyfriend for given her Chlamydia Plan:  denies Intent:  denies Means:  denies AEB (as evidenced by):  Psychiatric Specialty Exam: Review of Systems  Constitutional: Negative.   HENT: Negative.   Eyes: Negative.   Respiratory: Negative.   Cardiovascular: Negative.   Gastrointestinal: Negative.   Genitourinary: Negative.   Musculoskeletal: Negative.   Skin: Negative.   Neurological: Negative.   Psychiatric/Behavioral: Positive for substance abuse.    Blood pressure 96/67, pulse 93, temperature 98.1 F (36.7 C), temperature source Oral, resp. rate 18, height 5\' 5"  (1.651 m), weight 67.359 kg (148 lb 8 oz), last menstrual period 08/05/2012.Body mass index is 24.71 kg/(m^2).  General Appearance: Fairly Groomed  Patent attorney::  Fair  Speech:  Pressured  Volume:  Increased  Mood:  Angry   Affect:  Labile and Full Range  Thought Process:  Loose and Tangential  Orientation:  Full (Time, Place, and Person)  Thought Content:  Negative  Suicidal Thoughts:  No  Homicidal  Thoughts:  No  Memory:  Immediate;   Fair Recent;   Fair Remote;   Fair  Judgement:  Marginal  Insight:  Marginal  Psychomotor Activity:  Increased  Concentration:  fair  Recall:  Fair  Akathisia:  No  Handed:  Right  AIMS (if indicated):     Assets:  Communication Skills Desire for Improvement Physical Health  Sleep:  Number of Hours: 5.5   Current Medications: Current Facility-Administered Medications  Medication Dose Route Frequency Venisa Frampton Last Rate Last Dose  . acetaminophen (TYLENOL) tablet 650 mg  650 mg Oral Q6H PRN Verne Spurr, PA-C   650 mg at 08/10/12 1528  . alum & mag hydroxide-simeth (MAALOX/MYLANTA) 200-200-20 MG/5ML suspension 30 mL  30 mL Oral Q4H PRN Verne Spurr, PA-C      . cloNIDine (CATAPRES) tablet 0.1 mg  0.1 mg Oral Q8H PRN Mojeed Akintayo      . divalproex (DEPAKOTE ER) 24 hr tablet 1,000 mg  1,000 mg Oral BID Mojeed Akintayo   1,000 mg at 08/12/12 0759  . hydrOXYzine (ATARAX/VISTARIL) tablet 50 mg  50 mg Oral Q6H PRN Mojeed Akintayo   50 mg at 08/12/12 0623  . ibuprofen (ADVIL,MOTRIN) tablet 400 mg  400 mg Oral Q8H PRN Verne Spurr, PA-C   400 mg at 08/10/12 1104  . loratadine (CLARITIN) tablet 10 mg  10 mg Oral Daily Verne Spurr, PA-C   10 mg at 08/12/12 0759  . magnesium hydroxide (MILK OF  MAGNESIA) suspension 30 mL  30 mL Oral Daily PRN Verne Spurr, PA-C      . multivitamin with minerals tablet 1 tablet  1 tablet Oral Daily Verne Spurr, PA-C   1 tablet at 08/12/12 0759  . nicotine (NICODERM CQ - dosed in mg/24 hours) patch 21 mg  21 mg Transdermal Daily Mojeed Akintayo   21 mg at 08/12/12 1610  . norethindrone-ethinyl estradiol (JUNEL FE,GILDESS FE,LOESTRIN FE) 1-20 MG-MCG per tablet 1 tablet  1 tablet Oral Daily Mojeed Akintayo      . pseudoephedrine (SUDAFED) 12 hr tablet 120 mg  120 mg Oral Daily Verne Spurr, PA-C   120 mg at 08/12/12 0759  . traZODone (DESYREL) tablet 100 mg  100 mg Oral QHS Kerry Hough, PA-C   100 mg at 08/11/12 2129   . vitamin B-12 (CYANOCOBALAMIN) tablet 100 mcg  100 mcg Oral Daily Verne Spurr, PA-C   100 mcg at 08/12/12 9604    Lab Results:  Results for orders placed during the hospital encounter of 08/07/12 (from the past 48 hour(s))  VALPROIC ACID LEVEL     Status: Abnormal   Collection Time    08/12/12  6:19 AM      Result Value Range   Valproic Acid Lvl 103.9 (*) 50.0 - 100.0 ug/mL    Physical Findings: AIMS: Facial and Oral Movements Muscles of Facial Expression: None, normal Lips and Perioral Area: None, normal Jaw: None, normal Tongue: None, normal,Extremity Movements Upper (arms, wrists, hands, fingers): None, normal Lower (legs, knees, ankles, toes): None, normal, Trunk Movements Neck, shoulders, hips: None, normal, Overall Severity Severity of abnormal movements (highest score from questions above): None, normal Incapacitation due to abnormal movements: None, normal Patient's awareness of abnormal movements (rate only patient's report): No Awareness, Dental Status Current problems with teeth and/or dentures?: No Does patient usually wear dentures?: No  CIWA:  CIWA-Ar Total: 0 COWS:  COWS Total Score: 1  Treatment Plan Summary: Daily contact with patient to assess and evaluate symptoms and progress in treatment Medication management  Plan: 1. Will continue Depakote ER  1000mg  po BID for labile mood 2. Patient encouraged to attend group and other unit milieu. 3 Awaiting placement in drug rehab program. 4. ELOS 2-3 days   Medical Decision Making Problem Points:  Established problem, improving (2), Review of last therapy session (1) and Review of psycho-social stressors (1) Data Points:  Order Aims Assessment (2) Review of medication regiment & side effects (2) Review of new medications or change in dosage (2)  I certify that inpatient services furnished can reasonably be expected to improve the patient's condition.   Akintayo, Mojeed,MD 08/12/2012, 10:27 AM

## 2012-08-12 NOTE — Progress Notes (Signed)
Patient ID: Elizabeth [name], female   DOB: June 13, 1990, 22 y.o.   MRN: [id number] (D)Patient presents with neutral/stable mood this am; she is calm and cooperative.  Patient remains somewhat labile at times with some irritability.  She visited with her boyfriend at lunch with no incidence of anger.  She denies any SI/HI/AVH.  She hopes to be able to get into long term rehabilitation at [facility]; awaiting word from them if they can accept patient.  She is attending groups and participating in her treatment.  (A)Continue to monitor medication management and MD orders.  Safety checks continued every 15 minutes per protocol.  (R)Patient is interacting well with staff and peers on the unit.  Her behavior is appropriate.

## 2012-08-12 NOTE — Treatment Plan (Signed)
  Interdisciplinary Treatment Plan Update   Date Reviewed:  08/12/2012  Time Reviewed:  8:15 AM  Progress in Treatment:   Attending groups: Yes Participating in groups: Yes Taking medication as prescribed: Yes  Tolerating medication: Yes Family/Significant other contact made: Yes  Patient understands diagnosis: Yes As evidenced by asking for help with her anger and substance abuse Discussing patient identified problems/goals with staff: Yes  See initial plan Medical problems stabilized or resolved: Yes Denies suicidal/homicidal ideation: Yes Patient has not harmed self or others: Yes  For review of initial/current patient goals, please see plan of care.  Estimated Length of Stay:  1-2 days  Reason for Continuation of Hospitalization: Medication stabilization  New Problems/Goals identified:  N/A  Discharge Plan or Barriers:   [name] wants to get into [facility].  Backup plan is IOP  Additional Comments:  Elizabeth [name] exhibits no withdrawal symptoms, and her mood appears stable.  Attendees:  Signature: Thedore Mins, MD 08/12/2012 8:15 AM   Signature: Richelle Ito, LCSW 08/12/2012 8:15 AM  Signature:  08/12/2012 8:15 AM  Signature: Joslyn Devon, RN 08/12/2012 8:15 AM  Signature:  08/12/2012 8:15 AM  Signature:  08/12/2012 8:15 AM  Signature:   08/12/2012 8:15 AM  Signature:    Signature:    Signature:    Signature:    Signature:    Signature:      Scribe for Treatment Team:   Richelle Ito, LCSW  08/12/2012 8:15 AM

## 2012-08-12 NOTE — Progress Notes (Signed)
Date: 08/12/2012  Time: 11:00am  Group Topic/Focus:  Self Care: The focus of this group is to help patients understand the importance of self-care in order to improve or restore emotional, physical, spiritual, interpersonal, and financial health.  Participation Level: Active  Participation Quality: Appropriate, Sharing and Supportive  Affect: Appropriate  Cognitive: Appropriate  Insight: Appropriate  Engagement in Group: Engaged and Supportive  Modes of Intervention: Education and Support  Additional Comments: Pt was appropriate.  Lumpkin, Latoyia M  08/12/2012, 5:30 PM  

## 2012-08-13 MED ORDER — NORETHIN ACE-ETH ESTRAD-FE 1-20 MG-MCG PO TABS: 1.0000 | ORAL_TABLET | Freq: Every day | ORAL | Status: DC

## 2012-08-13 MED ORDER — LORATADINE 10 MG PO TABS: 10.0000 mg | ORAL_TABLET | Freq: Every day | ORAL | Status: DC | PRN

## 2012-08-13 MED ORDER — DIVALPROEX SODIUM ER 500 MG PO TB24: 1000.0000 mg | ORAL_TABLET | Freq: Two times a day (BID) | ORAL | Status: DC

## 2012-08-13 MED ORDER — CYANOCOBALAMIN 100 MCG PO TABS: 100.0000 ug | ORAL_TABLET | Freq: Every day | ORAL | Status: DC

## 2012-08-13 MED ORDER — TRAZODONE HCL 100 MG PO TABS: 100.0000 mg | ORAL_TABLET | Freq: Every day | ORAL | Status: DC

## 2012-08-13 MED ORDER — HYDROXYZINE HCL 50 MG PO TABS: 50.0000 mg | ORAL_TABLET | Freq: Four times a day (QID) | ORAL | Status: DC | PRN

## 2012-08-13 NOTE — Progress Notes (Signed)
[facility] Adult Case Management Discharge Plan :  Will you be returning to the same living situation after discharge: Yes,  can return home after completing treatment At discharge, do you have transportation home?:Yes,  [facility] to pick pt up today at 1:00 pm Do you have the ability to pay for your medications:Yes,  access to meds  Release of information consent forms completed and in the chart;  Patient's signature needed at discharge.  Patient to Follow up at: Follow-up Information   Follow up with [facility] On 08/13/2012. (Will be picked up today at 1:00 pm for further inpatient treatment)    Contact information:   [address] [place], [place] [address] Phone: [phone] Fax: [phone]      Patient denies SI/HI:   Yes,  denies SI/HI    Safety Planning and Suicide Prevention discussed:  Yes,  discussed with pt and mother (see suicide prevention note)  Pt got into [facility] for inpatient treatment and will go today.    Carmina Miller 08/13/2012, 10:36 AM

## 2012-08-13 NOTE — Discharge Summary (Signed)
Physician Discharge Summary Note  Patient:  Elizabeth [name] is an 22 y.o., female MRN:  [id number] DOB:  05-29-1991 Patient phone:  [phone] (home)  Patient address:   [address]  [place] [address],   Date of Admission:  08/07/2012 Date of Discharge: 08/13/12  Reason for Admission: Polysubstance dependency/Detox  Discharge Diagnoses: Principal Problem:   Bipolar I disorder, most recent episode (or current) manic Active Problems:   Cocaine abuse with cocaine-induced mood disorder  Review of Systems  Constitutional: Negative.   HENT: Negative.   Eyes: Negative.   Respiratory: Positive for shortness of breath and wheezing.   Cardiovascular: Negative.   Gastrointestinal: Negative.   Genitourinary: Negative.   Musculoskeletal: Negative.   Skin: Negative.   Neurological: Negative.   Endo/Heme/Allergies: Negative.   Psychiatric/Behavioral: Positive for substance abuse.   Axis Diagnosis:   AXIS I:  Bipolar 1, Polysubstance Abuse AXIS II:  Deferred AXIS III:   Past Medical History  Diagnosis Date  . Depression    AXIS IV:  other psychosocial or environmental problems AXIS V:  61-70 mild symptoms  Level of Care:  [facility]  Hospital Course:Mattilyn reported to the ED requesting detox from cocaine, marijuana, and sex. She was sent for medical clearance and did not meet criteria via a negative drug screen. Initially the patient was angry and uncooperative, but was finally able to provide information regarding her past psychiatric history. 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 She notes that she last used cocaine a week ago, but was using THC every weekend for 6 months. Fantasha reports she has been off of her psych meds since she lost her job at [employer] in August. She notes her symptoms are  poor attitude, irritability, poor sleep, racing thoughts, poor concentration, and occasionally she will hear voices, usually her family members.   During hospitalization patient was started on Depakote ER 1,000 mg two times daily to help with mood stabilization, trazodone 100 mg was started for sleep. Vistaril 50 mg every six hours was available to help with anxious symptoms. Patient attended groups during her stay that focused on developing new coping skills such as music therapy. She denied any SI/HI at discharge. The patient is mentally and physically stable for discharge. Rx provided at time of discharge. She will continue her care at [facility] [place].   Consults:  None  Significant Diagnostic Studies:  labs: CBC, Valproic Acid Level, Glucose WNL  Discharge Vitals:   Blood pressure 123/70, pulse 121, temperature 98.2 F (36.8 C), temperature source Oral, resp. rate 20, height 5\' 5"  (1.651 m), weight 67.359 kg (148 lb 8 oz), last menstrual period 08/05/2012. Body mass index is 24.71 kg/(m^2). Lab Results:   Results for orders placed during the hospital encounter of 08/07/12 (from the past 72 hour(s))  VALPROIC ACID LEVEL     Status: Abnormal   Collection Time    08/12/12  6:19 AM      Result Value Range   Valproic Acid Lvl 103.9 (*) 50.0 - 100.0 ug/mL    Physical Findings: AIMS: Facial and Oral Movements Muscles of Facial Expression: None, normal Lips and Perioral Area: None, normal Jaw: None, normal Tongue: None, normal,Extremity Movements Upper (arms, wrists, hands, fingers): None, normal Lower (legs, knees, ankles, toes): None, normal, Trunk  Movements Neck, shoulders, hips: None, normal, Overall Severity Severity of abnormal movements (highest score from questions above): None, normal Incapacitation due to abnormal movements: None, normal Patient's awareness of abnormal movements (rate only patient's report): No Awareness, Dental Status Current problems with teeth and/or  dentures?: No Does patient usually wear dentures?: No  CIWA:  CIWA-Ar Total: 0 COWS:  COWS Total Score: 1  Psychiatric Specialty Exam: See Psychiatric Specialty Exam and Suicide Risk Assessment completed by Attending Physician prior to discharge.  Discharge destination:  Other:  [facility]  Is patient on multiple antipsychotic therapies at discharge:  No   Has Patient had three or more failed trials of antipsychotic monotherapy by history:  No  Recommended Plan for Multiple Antipsychotic Therapies: None  Discharge Orders   Future Orders Complete By Expires     Activity as tolerated - No restrictions  As directed     Diet - low sodium heart healthy  As directed         Medication List    TAKE these medications     Indication   cyanocobalamin 100 MCG tablet  Take 1 tablet (100 mcg total) by mouth daily.   Indication:  Inadequate Vitamin B12     divalproex 500 MG 24 hr tablet  Commonly known as:  DEPAKOTE ER  Take 2 tablets (1,000 mg total) by mouth 2 (two) times daily.   Indication:  Manic Phase of Manic-Depression     hydrOXYzine 50 MG tablet  Commonly known as:  ATARAX/VISTARIL  Take 1 tablet (50 mg total) by mouth every 6 (six) hours as needed for anxiety.   Indication:  Tension     norethindrone-ethinyl estradiol 1-20 MG-MCG tablet  Commonly known as:  JUNEL FE,GILDESS FE,LOESTRIN FE  Take 1 tablet by mouth daily.   Indication:  Pregnancy     traZODone 100 MG tablet  Commonly known as:  DESYREL  Take 1 tablet (100 mg total) by mouth at bedtime.   Indication:  Anxiety Disorder, Trouble Sleeping           Follow-up Information   Follow up with [facility] On 08/13/2012. (Will be picked up today at 1:00 pm for further inpatient treatment)    Contact information:   [address], [place], [place] [address] Phone: [phone] Fax: [phone]      Follow-up recommendations:  Activity:  As tolerated Diet:  Low sodium, heart healthy  diet  Comments:  Patient will continue their rehab at [facility].   Total Discharge Time:  Greater than 30 minutes.  SignedFransisca Kaufmann ANN NP-C 08/13/2012, 11:30 AM

## 2012-08-13 NOTE — Clinical Social Work Note (Signed)
[facility] LCSW Aftercare Discharge Planning Group Note  08/13/2012 9:40 AM  Participation Quality:  Appropriate and Attentive  Affect:  Appropriate  Cognitive:  Alert and Appropriate  Insight:  Developing/Improving and Engaged  Engagement in Group:  Developing/Improving and Engaged  Modes of Intervention:  Clarification, Discussion, Education, Exploration, Limit-setting, Orientation, Problem-solving, Rapport Building, Socialization and Support  Summary of Progress/Problems:  CSW met with pt during aftercare discharge planning group to review discharge plans and to address any needs for today.  Pt presents with calm mood and affect.  Pt denies having depression, SI/HI and rates anxiety at a "20" until she took Vistaril, which pt now reports her anxiety at a "11", on a scale of 1-10.  Pt was to interview with [facility] yesterday but failed to do so, stating that they closed before she reached them.  CSW encouraged pt to call until she reached someone and interviewed this am.  Pt states that she will need them to transport her there if she is accepted.  CSW to monitor this referral today.  No further needs voiced by pt at this time.    Carmina Miller 08/13/2012, 9:40 AM

## 2012-08-13 NOTE — Progress Notes (Signed)
Patient ID: Elizabeth [name], female   DOB: 1991-04-24, 24 y.o.   MRN: [id number] (D)Patient discharged home per MD order.  She will be transported to [facility] by their representative.  Patient is motivated for further treatment of her substance abuse.  She has participated well in her treatment here at [facility].  She attended groups and participated.  She denies any SI/HI/AVH.  (A)Discharge paperwork initiated.  Patient received all her personal belongings from her room and locker.  No medication samples were needed.  (R)Patient discharged to lobby in care of her parents.  [facility].

## 2012-08-13 NOTE — [facility] Suicide Risk Assessment (Signed)
Suicide Risk Assessment  Discharge Assessment     Demographic Factors:  Unemployed and female  Mental Status Per Nursing Assessment::   On Admission:  NA  Current Mental Status by Physician: patient denies suicidal ideation, intent or plan  Loss Factors: NA  Historical Factors: Family history of mental illness or substance abuse and Impulsivity  Risk Reduction Factors:   Living with another person, especially a relative, Positive social support and Positive therapeutic relationship  Continued Clinical Symptoms:  Bipolar Disorder: MRE manic Alcohol/Substance Abuse/Dependencies  Cognitive Features That Contribute To Risk:  Closed-mindedness Polarized thinking    Suicide Risk:  Minimal: No identifiable suicidal ideation.  Patients presenting with no risk factors but with morbid ruminations; may be classified as minimal risk based on the severity of the depressive symptoms  Discharge Diagnoses:   AXIS I:  Bipolar I disorder, most recent episode (or current) manic              Poly substance abuse  AXIS II:  Deferred AXIS III:   Past Medical History  Diagnosis Date   AXIS IV:  other psychosocial or environmental problems and problems related to social environment AXIS V:  61-70 mild symptoms  Plan Of Care/Follow-up recommendations:  Activity:  as tolerated Diet:  healthy Tests:  Valproic acid level routinely. Current VPA level is: 103.9 at 08/12/12 Other:  Patient will be discharged to [facility].  Is patient on multiple antipsychotic therapies at discharge:  No   Has Patient had three or more failed trials of antipsychotic monotherapy by history:  No  Recommended Plan for Multiple Antipsychotic Therapies: N/A  Akintayo, Mojeed,MD 08/13/2012, 10:13 AM

## 2012-08-13 NOTE — Progress Notes (Signed)
Adult Psychoeducational Group Note  Date:  08/13/2012 Time:  0930 Group Topic/Focus:  Recovery Goals:   The focus of this group is to identify appropriate goals for recovery and establish a plan to achieve them.  Participation Level:  Active  Participation Quality:  Appropriate  Affect:  Appropriate  Cognitive:  Oriented  Insight: Good  Engagement in Group:  Engaged  Modes of Intervention:  Education  Additional Comments:  Pt was able to attend group this morning and share positively.  Zackry Deines E 08/13/2012, 11:08 AM

## 2012-08-13 NOTE — Progress Notes (Signed)
[facility] INPATIENT:  Family/Significant Other Suicide Prevention Education  Suicide Prevention Education:  Education Completed; Elizabeth [name] - mother [phone]),  (name of family member/significant other) has been identified by the patient as the family member/significant other with whom the patient will be residing, and identified as the person(s) who will aid the patient in the event of a mental health crisis (suicidal ideations/suicide attempt).  With written consent from the patient, the family member/significant other has been provided the following suicide prevention education, prior to the and/or following the discharge of the patient.  The suicide prevention education provided includes the following:  Suicide risk factors  Suicide prevention and interventions  National Suicide Hotline telephone number  [facility] assessment telephone number  [facility] Emergency Assistance 911  [facility] and/or Residential Mobile Crisis Unit telephone number  Request made of family/significant other to:  Remove weapons (e.g., guns, rifles, knives), all items previously/currently identified as safety concern.    Remove drugs/medications (over-the-counter, prescriptions, illicit drugs), all items previously/currently identified as a safety concern.  The family member/significant other verbalizes understanding of the suicide prevention education information provided.  The family member/significant other agrees to remove the items of safety concern listed above.  Elizabeth [name] 08/13/2012, 10:35 AM

## 2012-08-14 NOTE — Discharge Summary (Signed)
Seen and agreed. Mojeed Akintayo, MD 

## 2012-08-16 NOTE — Progress Notes (Signed)
Patient Discharge Instructions:  After Visit Summary (AVS):   Faxed to:  08/16/12 Discharge Summary Note:   Faxed to:  08/16/12 Psychiatric Admission Assessment Note:   Faxed to:  08/16/12 Suicide Risk Assessment - Discharge Assessment:   Faxed to:  08/16/12 Faxed/Sent to the Next Level Care provider:  08/16/12 Faxed to [facility] @ [phone]  Jerelene Redden, 08/16/2012, 3:45 PM

## 2012-12-15 ENCOUNTER — Encounter (HOSPITAL_COMMUNITY): Payer: Self-pay | Admitting: *Deleted

## 2012-12-15 ENCOUNTER — Emergency Department (HOSPITAL_COMMUNITY)
Admission: EM | Admit: 2012-12-15 | Discharge: 2012-12-15 | Disposition: A | Discharge status: Home / Self Care | Payer: 59 | Attending: Emergency Medicine | Admitting: Emergency Medicine

## 2012-12-15 DIAGNOSIS — F329 Major depressive disorder, single episode, unspecified: Secondary | ICD-10-CM | POA: Insufficient documentation

## 2012-12-15 DIAGNOSIS — Z79899 Other long term (current) drug therapy: Secondary | ICD-10-CM | POA: Insufficient documentation

## 2012-12-15 DIAGNOSIS — F3289 Other specified depressive episodes: Secondary | ICD-10-CM | POA: Insufficient documentation

## 2012-12-15 DIAGNOSIS — L02214 Cutaneous abscess of groin: Secondary | ICD-10-CM

## 2012-12-15 DIAGNOSIS — Z880 Allergy status to penicillin: Secondary | ICD-10-CM | POA: Insufficient documentation

## 2012-12-15 DIAGNOSIS — L02219 Cutaneous abscess of trunk, unspecified: Secondary | ICD-10-CM | POA: Insufficient documentation

## 2012-12-15 DIAGNOSIS — F17200 Nicotine dependence, unspecified, uncomplicated: Secondary | ICD-10-CM | POA: Insufficient documentation

## 2012-12-15 MED ORDER — HYDROCODONE-ACETAMINOPHEN 5-325 MG PO TABS
1.0000 | ORAL_TABLET | Freq: Once | ORAL | Status: AC
  Administered 2012-12-15: 1 via ORAL
  Filled 2012-12-15: qty 1

## 2012-12-15 MED ORDER — HYDROCODONE-ACETAMINOPHEN 5-325 MG PO TABS: 2.0000 | ORAL_TABLET | ORAL | Status: DC | PRN

## 2012-12-15 NOTE — ED Notes (Signed)
Pt is here with abscess to left groin area and pt states it is about a quarter in size

## 2012-12-15 NOTE — ED Provider Notes (Signed)
History    CSN: [id number] Arrival date & time 12/15/12  5784  First MD Initiated Contact with Patient 12/15/12 0920     Chief Complaint  Patient presents with  . Abscess   (Consider location/radiation/quality/duration/timing/severity/associated sxs/prior Treatment) HPI  22 year old female with history of recurrent abscess presents for evaluations of an abscess to the left groin.  Patient noticed gradual onset of pain and swelling to left groin at the same site that she has previous abscess. Patient has tried taking warm bath with minimal relief. Denies any fever, dysuria, hematuria, or rash. Admits to shaving often in the summer time, and also wearing tight pants. Pt is a smoker, no hx of diabetes.  Pt has had abscesses in her armpit many years ago.    Past Medical History  Diagnosis Date  . Depression    Past Surgical History  Procedure Laterality Date  . Right foot surgery    . Abscess drainage      left groin  . Tonsillectomy and adenoidectomy    . Heel spur surgery     No family history on file. History  Substance Use Topics  . Smoking status: Current Every Day Smoker -- 0.25 packs/day  . Smokeless tobacco: Never Used  . Alcohol Use: 2.4 oz/week    4 Glasses of wine per week     Comment: occasional   OB History   Grav Para Term Preterm Abortions TAB SAB Ect Mult Living                 Review of Systems  Constitutional: Negative for fever.  Skin: Negative for rash.    Allergies  Penicillins and Depakote  Home Medications   Current Outpatient Rx  Name  Route  Sig  Dispense  Refill  . gabapentin (NEURONTIN) 100 MG capsule   Oral   Take 100 mg by mouth 2 (two) times daily.         . hydrOXYzine (VISTARIL) 100 MG capsule   Oral   Take 100 mg by mouth every 4 (four) hours as needed for anxiety.         [name] ibuprofen (ADVIL,MOTRIN) 200 MG tablet   Oral   Take 400 mg by mouth every 6 (six) hours as needed for pain.         [name] norethindrone-ethinyl  estradiol (JUNEL FE,GILDESS FE,LOESTRIN FE) 1-20 MG-MCG tablet   Oral   Take 1 tablet by mouth daily.   1 Package   0   . risperiDONE (RISPERDAL) 1 MG tablet   Oral   Take 1-2 mg by mouth 2 (two) times daily. Take 1 mg in the morning, and 2 mg (two tablets) at bedtime.         . vitamin B-12 100 MCG tablet   Oral   Take 1 tablet (100 mcg total) by mouth daily.   30 tablet   0    BP 109/76  Pulse 89  Temp(Src) 98.2 F (36.8 C) (Oral)  Resp 18  SpO2 98% Physical Exam  Nursing note and vitals reviewed. Constitutional: She appears well-developed and well-nourished. No distress.  HENT:  Head: Atraumatic.  Eyes: Conjunctivae are normal.  Neck: Neck supple.  Genitourinary:  Chaperone present:  L groin: a quarter size abscess with induration/fluctuance no rash. ttp.  Neurological: She is alert.  Skin: Skin is warm.  Psychiatric: She has a normal mood and affect.    ED Course  Procedures (including critical care time)  10:14 AM Pt with  recurrent abscess.  Successful I&D by me.  Smoking cessation discussed.  Recommend avoid shaving and wearing tight pants.  CCS referral as needed.  Care instruction including warm compress and wound care.     INCISION AND DRAINAGE Performed by: Fayrene Helper Consent: Verbal consent obtained. Risks and benefits: risks, benefits and alternatives were discussed Type: abscess  Body area: L groin  Anesthesia: local infiltration  Incision was made with a scalpel.  Local anesthetic: lidocaine 2% w epinephrine  Anesthetic total: 2 ml  Complexity: complex Blunt dissection to break up loculations  Drainage: purulent  Drainage amount: small  Packing material: 1/4 in iodoform gauze  Patient tolerance: Patient tolerated the procedure well with no immediate complications.    Labs Reviewed - No data to display No results found. 1. Abscess of left groin     MDM  BP 109/76  Pulse 89  Temp(Src) 98.2 F (36.8 C) (Oral)  Resp 18   SpO2 98%   Fayrene Helper, PA-C 12/15/12 1015

## 2012-12-16 NOTE — ED Provider Notes (Signed)
Medical screening examination/treatment/procedure(s) were performed by non-physician practitioner and as supervising physician I was immediately available for consultation/collaboration.   Suzi Roots, MD 12/16/12 1450

## 2013-08-20 DIAGNOSIS — J45909 Unspecified asthma, uncomplicated: Secondary | ICD-10-CM | POA: Insufficient documentation

## 2013-08-20 DIAGNOSIS — F411 Generalized anxiety disorder: Secondary | ICD-10-CM | POA: Insufficient documentation

## 2013-08-20 HISTORY — DX: Unspecified asthma, uncomplicated: J45.909

## 2013-09-03 DIAGNOSIS — Z309 Encounter for contraceptive management, unspecified: Secondary | ICD-10-CM | POA: Insufficient documentation

## 2013-09-20 MED ORDER — GABAPENTIN 100 MG CAP
100 mg | ORAL_CAPSULE | ORAL | Status: DC
Start: 2013-09-20 — End: 2016-07-01

## 2013-09-20 MED ORDER — BUTALBITAL-ACETAMINOPHEN-CAFFEINE 50 MG-325 MG-40 MG TAB
50-325-40 mg | ORAL_TABLET | ORAL | Status: AC | PRN
Start: 2013-09-20 — End: 2013-10-20

## 2013-09-20 NOTE — ED Notes (Signed)
Patient states intermittent headaches over past two weeks.  States pain originated in posterior neck and travels upward into posterior head.  She states having numbness to left side of face with headaches.

## 2013-09-20 NOTE — ED Provider Notes (Signed)
HPI Comments: Patient is a 23 y.o. Hispanic female with the following medical history:   Past Medical History:    Eczema                                                      Presents to the ED with left sided neck tightness that radiates up the back of her head to her forehead and is there most of the time with intermittent daily episodes of sharp shooting pain around her left eye with visual changes, burry vision, seeing bright lights and is relieved by either rest or 800 mg of Ibuprofen and is gone after 2 hours.  The left side of her face has gone numb and stays numb most of the day without muscle weakness. She is going to have her wisdom teeth removed next week.  She previously had migraines and was on Topamax therapy at 16 for one year.  She gave birth to her third child 3 months ago and is on birth control at this time.  She denies fever/chills, CP, palpitations, SOB, diaphoresis, orthopnea, abd or back pain, extremity edema/weakness/numbness/parestheisa, hot flashes, night sweats or unexplained change of weight.           Patient is a 23 y.o. female presenting with headaches. The history is provided by the patient.   Headache   This is a new problem. Episode onset: 3 weeks  The problem occurs every few hours. The problem has not changed since onset.The headache is aggravated by an unknown factor. The pain is located in the left unilateral region. The quality of the pain is described as dull, throbbing and sharp. The pain is at a severity of 3/10. The pain is mild. Associated symptoms include tingling (left face) and visual change. Pertinent negatives include no anorexia, no fever, no malaise/fatigue, no chest pressure, no near-syncope, no orthopnea, no palpitations, no syncope, no shortness of breath, no weakness, no dizziness, no nausea and no vomiting. Treatments tried: Ibuprofen. The treatment provided no relief.        Past Medical History   Diagnosis Date   ??? Eczema         Past Surgical History    Procedure Laterality Date   ??? Hx ankle fracture tx       with reconstruction   ??? Hx cesarean section  2015         History reviewed. No pertinent family history.     History     Social History   ??? Marital Status: SINGLE     Spouse Name: N/A     Number of Children: N/A   ??? Years of Education: N/A     Occupational History   ??? Not on file.     Social History Main Topics   ??? Smoking status: Current Every Day Smoker   ??? Smokeless tobacco: Not on file   ??? Alcohol Use: No   ??? Drug Use: No   ??? Sexual Activity: Not on file     Other Topics Concern   ??? Not on file     Social History Narrative   ??? No narrative on file                  ALLERGIES: Review of patient's allergies indicates no known allergies.      Review  of Systems   Constitutional: Negative for fever, chills, malaise/fatigue, activity change and appetite change.   HENT: Negative for trouble swallowing and voice change.    Eyes: Negative for discharge, redness and visual disturbance.   Respiratory: Negative for cough, chest tightness and shortness of breath.    Cardiovascular: Negative for chest pain, palpitations, orthopnea, leg swelling, syncope and near-syncope.   Gastrointestinal: Negative for nausea, vomiting, abdominal pain, diarrhea, constipation and anorexia.   Genitourinary: Negative for dysuria, urgency, frequency, flank pain and difficulty urinating.   Musculoskeletal: Negative for back pain, joint swelling, neck pain and neck stiffness.   Skin: Negative for color change, rash and wound.   Allergic/Immunologic: Negative for immunocompromised state.   Neurological: Positive for tingling (left face), numbness and headaches. Negative for dizziness, tremors, seizures, syncope, facial asymmetry, speech difficulty, weakness and light-headedness.   Hematological: Negative for adenopathy.   Psychiatric/Behavioral: Negative for behavioral problems and agitation. The patient is not nervous/anxious.        Filed Vitals:    09/20/13 1740   BP: 136/87   Pulse: 70    Temp: 98.2 ??F (36.8 ??C)   Resp: 18   Height: 5\' 4"  (1.626 m)   Weight: 86.183 kg (190 lb)   SpO2: 100%            Physical Exam   Constitutional: She is oriented to person, place, and time. She appears well-developed and well-nourished. No distress.   HENT:   Head: Normocephalic.   Right Ear: Hearing and tympanic membrane normal.   Left Ear: Hearing and tympanic membrane normal. There is tenderness. There is mastoid tenderness. Tympanic membrane is not injected, not erythematous and not bulging.   Nose: Nose normal.   Mouth/Throat: Uvula is midline, oropharynx is clear and moist and mucous membranes are normal.       Eyes: Conjunctivae, EOM and lids are normal. Pupils are equal, round, and reactive to light. Right eye exhibits no discharge. Left eye exhibits no discharge. No scleral icterus.   Neck: Normal range of motion. Neck supple. No JVD present. No tracheal deviation present.   Cardiovascular: Normal rate, regular rhythm and normal heart sounds.  Exam reveals no gallop and no friction rub.    No murmur heard.  Pulmonary/Chest: Effort normal and breath sounds normal. No stridor. No respiratory distress. She has no wheezes. She exhibits no tenderness.   Abdominal: Soft. Bowel sounds are normal. She exhibits no mass. There is no tenderness. There is no guarding.   Musculoskeletal: Normal range of motion. She exhibits no edema or tenderness.   Lymphadenopathy:     She has no cervical adenopathy.   Neurological: She is alert and oriented to person, place, and time. She has normal strength. A sensory deficit (upper, middle and lower portion of left face has 40% sensation compared to 100% of right) is present. No cranial nerve deficit. She displays a negative Romberg sign. Coordination and gait normal. GCS eye subscore is 4. GCS verbal subscore is 5. GCS motor subscore is 6. She displays no Babinski's sign on the right side. She displays no Babinski's sign on the left side.   Reflex Scores:       Tricep reflexes are  1+ on the right side and 1+ on the left side.       Bicep reflexes are 1+ on the right side and 1+ on the left side.       Brachioradialis reflexes are 1+ on the right side and 1+ on the  left side.       Patellar reflexes are 1+ on the right side and 1+ on the left side.  Skin: Skin is warm and dry. No rash noted. No erythema.   Psychiatric: She has a normal mood and affect. Her behavior is normal. Judgment and thought content normal.   Nursing note and vitals reviewed.       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 Janet Pugh is a conversational, A&O, without distress healthy appearing young woman who states that she is already getting a full night of sleep after having a baby 3 months ago and seems to be having two separate issues of TMJ that may be causing compression of left trigeminal nerve that is currently numb along with intermittent migraine headaches that are not sudden onset, wax and wane and she states that she currently has an ebbing headache and stopped taking any medication two days ago because it didn't seem to make any difference.  Discussed with her the need to follow up with her dentist for the TMJ, Neurology for the facial numbness, nerve syndrome and migraines and her family doctor for general care.  She does not have a PMD so will suggest a local clinic.    Discussed treatment with Neurontin with titration dose and encouraged her to monitor her dose to avoid sedation.  She is not breast feeding.    She verbalized understanding of all and is agreeable to the discharge plan.    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 Diagnoses of TMJ arthralgia and Trigeminal nerve injury, left, initial encounter were also pertinent to this visit.        Procedures

## 2013-09-20 NOTE — ED Notes (Signed)
I have reviewed discharge instructions with the patient. Prescriptions x 2  were reviewed with patient instructed not to drink alcohol, drive a car, or operate heavy machinery while taking this medicine. The patient verbalized understanding. Patient seen leaving ED ambulatory without difficulty or need for assistance, in no sign of distress. Patient armband removed and shredded

## 2013-09-20 NOTE — ED Notes (Signed)
Pt states that she has had neck pain for 3 weeks.  She states that she initially thought that she slept wrong.  She bought a new mattress and pillows thinking that would help, but it didn't.  Pt states that about 2 weeks ago the pain started shooting up the back of her neck into her head on the left side.  She states that she started having intermittent tingling on her left side of her face.  She states that she had migraines in high school.  Pt awake and alert, MAE.

## 2013-11-22 ENCOUNTER — Inpatient Hospital Stay (HOSPITAL_COMMUNITY)
Admission: AD | Admit: 2013-11-22 | Discharge: 2013-11-22 | Disposition: A | Discharge status: Home / Self Care | Payer: 59 | Source: Ambulatory Visit | Attending: Obstetrics and Gynecology | Admitting: Obstetrics and Gynecology

## 2013-11-22 ENCOUNTER — Encounter (HOSPITAL_COMMUNITY): Payer: Self-pay | Admitting: *Deleted

## 2013-11-22 DIAGNOSIS — N758 Other diseases of Bartholin's gland: Secondary | ICD-10-CM

## 2013-11-22 DIAGNOSIS — N751 Abscess of Bartholin's gland: Secondary | ICD-10-CM | POA: Insufficient documentation

## 2013-11-22 DIAGNOSIS — R42 Dizziness and giddiness: Secondary | ICD-10-CM | POA: Insufficient documentation

## 2013-11-22 DIAGNOSIS — F17200 Nicotine dependence, unspecified, uncomplicated: Secondary | ICD-10-CM | POA: Insufficient documentation

## 2013-11-22 DIAGNOSIS — N72 Inflammatory disease of cervix uteri: Secondary | ICD-10-CM

## 2013-11-22 DIAGNOSIS — N926 Irregular menstruation, unspecified: Secondary | ICD-10-CM | POA: Insufficient documentation

## 2013-11-22 DIAGNOSIS — F1220 Cannabis dependence, uncomplicated: Secondary | ICD-10-CM | POA: Diagnosis present

## 2013-11-22 DIAGNOSIS — N7689 Other specified inflammation of vagina and vulva: Secondary | ICD-10-CM

## 2013-11-22 LAB — URINALYSIS, ROUTINE W REFLEX MICROSCOPIC
BILIRUBIN URINE: NEGATIVE
Glucose, UA: NEGATIVE mg/dL
KETONES UR: NEGATIVE mg/dL
Leukocytes, UA: NEGATIVE
Nitrite: POSITIVE — AB
PH: 5.5 (ref 5.0–8.0)
Protein, ur: NEGATIVE mg/dL
SPECIFIC GRAVITY, URINE: 1.02 (ref 1.005–1.030)
UROBILINOGEN UA: 1 mg/dL (ref 0.0–1.0)

## 2013-11-22 LAB — URINE MICROSCOPIC-ADD ON

## 2013-11-22 LAB — RAPID URINE DRUG SCREEN, HOSP PERFORMED
Amphetamines: NOT DETECTED
Barbiturates: NOT DETECTED
Benzodiazepines: NOT DETECTED
Cocaine: POSITIVE — AB
OPIATES: NOT DETECTED
Tetrahydrocannabinol: POSITIVE — AB

## 2013-11-22 LAB — WET PREP, GENITAL
TRICH WET PREP: NONE SEEN
YEAST WET PREP: NONE SEEN

## 2013-11-22 LAB — POCT PREGNANCY, URINE: Preg Test, Ur: NEGATIVE

## 2013-11-22 MED ORDER — DOXYCYCLINE HYCLATE 100 MG PO CAPS: 100.0000 mg | ORAL_CAPSULE | Freq: Two times a day (BID) | ORAL | Status: DC

## 2013-11-22 NOTE — MAU Provider Note (Signed)
History     CSN: [id number]  Arrival date and time: 11/22/13 1004   First Provider Initiated Contact with Patient 11/22/13 1252      Chief Complaint  Patient presents with  . Foreign Body in Vagina  . Dizziness   HPI Comments: Elizabeth [name] 23 y.o. G1P0010 presents to MAU with what she thinks is a tampon in her vagina X 1 week. She is on her menses today and has complaints of 2 cycles per month. She has same sexual partner for last year. She has a long history of mental illness and drug abuse.  Foreign Body in Vagina  Dizziness      Past Medical History  Diagnosis Date  . Depression     Past Surgical History  Procedure Laterality Date  . Right foot surgery    . Abscess drainage      left groin  . Tonsillectomy and adenoidectomy    . Heel spur surgery      History reviewed. No pertinent family history.  History  Substance Use Topics  . Smoking status: Current Every Day Smoker -- 0.25 packs/day  . Smokeless tobacco: Never Used  . Alcohol Use: 2.4 oz/week    4 Glasses of wine per week     Comment: occasional    Allergies:  Allergies  Allergen Reactions  . Depakote [Divalproex Sodium] Anaphylaxis and Other (See Comments)    "Almost died"  . Penicillins Anaphylaxis    Prescriptions prior to admission  Medication Sig Dispense Refill  . Cyanocobalamin (B-12 PO) Take 2 tablets by mouth every other day. Takes 2 B-12 Vitamin Gummies      . ibuprofen (ADVIL,MOTRIN) 200 MG tablet Take 400 mg by mouth every 6 (six) hours as needed for pain.      [name]. loratadine-pseudoephedrine (CLARITIN-D 24-HOUR) 10-240 MG per 24 hr tablet Take 1 tablet by mouth daily as needed for allergies.      . Lurasidone HCl (LATUDA) 20 MG TABS Take 20 mg by mouth at bedtime.      . norethindrone-ethinyl estradiol (JUNEL FE,GILDESS FE,LOESTRIN FE) 1-20 MG-MCG tablet Take 1 tablet by mouth daily.  1 Package  0    Review of Systems  Constitutional: Negative.   HENT: Negative.   Eyes:  Negative.   Respiratory: Negative.   Cardiovascular: Negative.   Gastrointestinal: Negative.   Genitourinary:       Vaginal bleeding and a stuck tampon  Skin: Negative.   Neurological: Positive for dizziness.  Psychiatric/Behavioral: Negative.    Physical Exam   Blood pressure 123/64, pulse 84, temperature 98.7 F (37.1 C), temperature source Oral, resp. rate 16, height 5' 4.5" (1.638 m), weight 79.47 kg (175 lb 3.2 oz), last menstrual period 11/10/2013, SpO2 100.00%.  Physical Exam  Constitutional: She is oriented to person, place, and time. She appears well-developed and well-nourished. No distress.  HENT:  Head: Normocephalic and atraumatic.  GI: Soft. Bowel sounds are normal. She exhibits no distension. There is no tenderness. There is no rebound and no guarding.  Genitourinary:  Genital: Bartholin abscess at right labia that is not ready to lance. tender Vaginal: moderate amount blood Cervix: closed/ think/ no cmt Bimanual:nontender   Musculoskeletal: Normal range of motion.  Neurological: She is alert and oriented to person, place, and time.  Skin: Skin is warm and dry.  Psychiatric:  Giggling     MAU Course  Procedures  MDM  UDS  Assessment and Plan   A: Bartholin Abcess  Irregular Menses  P:   Keflex 500 mg po QID x 10 days Hot soaks Motrin 800 mg TID prn Note for work per pt request for today and tomorrow Pt did not wait for lab results to be done  Carolynn ServeBarefoot, Linda Miller 11/22/2013, 1:02 PM

## 2013-11-22 NOTE — Discharge Instructions (Signed)
Bartholin's Cyst or Abscess °Bartholin's glands are small glands located within the folds of skin (labia) along the sides of the lower opening of the vagina (birth canal). A cyst may develop when the duct of the gland becomes blocked. When this happens, fluid that accumulates within the cyst can become infected. This is known as an abscess. The Bartholin gland produces a mucous fluid to lubricate the outside of the vagina during sexual intercourse. °SYMPTOMS  °· Patients with a small cyst may not have any symptoms. °· Mild discomfort to severe pain depending on the size of the cyst and if it is infected (abscess). °· Pain, redness, and swelling around the lower opening of the vagina. °· Painful intercourse. °· Pressure in the perineal area. °· Swelling of the lips of the vagina (labia). °· The cyst or abscess can be on one side or both sides of the vagina. °DIAGNOSIS  °· A large swelling is seen in the lower vagina area by your caregiver. °· Painful to touch. °· Redness and pain, if it is an abscess. °TREATMENT  °· Sometimes the cyst will go away on its own. °· Apply warm wet compresses to the area or take hot sitz baths several times a day. °· An incision to drain the cyst or abscess with local anesthesia. °· Culture the pus, if it is an abscess. °· Antibiotic treatment, if it is an abscess. °· Cut open the gland and suture the edges to make the opening of the gland bigger (marsupialization). °· Remove the whole gland if the cyst or abscess returns. °PREVENTION  °· Practice good hygiene. °· Clean the vaginal area with a mild soap and soft cloth when bathing. °· Do not rub hard in the vaginal area when bathing. °· Protect the crotch area with a padded cushion if you take long bike rides or ride horses. °· Be sure you are well lubricated when you have sexual intercourse. °HOME CARE INSTRUCTIONS  °· If your cyst or abscess was opened, a small piece of gauze, or a drain, may have been placed in the wound to allow  drainage. Do not remove this gauze or drain unless directed by your caregiver. °· Wear feminine pads, not tampons, as needed for any drainage or bleeding. °· If antibiotics were prescribed, take them exactly as directed. Finish the entire course. °· Only take over-the-counter or prescription medicines for pain, discomfort, or fever as directed by your caregiver. °SEEK IMMEDIATE MEDICAL CARE IF:  °· You have an increase in pain, redness, swelling, or drainage. °· You have bleeding from the wound which results in the use of more than the number of pads suggested by your caregiver in 24 hours. °· You have chills. °· You have a fever. °· You develop any new problems (symptoms) or aggravation of your existing condition. °MAKE SURE YOU:  °· Understand these instructions. °· Will watch your condition. °· Will get help right away if you are not doing well or get worse. °Document Released: 05/22/2005 Document Revised: 08/14/2011 Document Reviewed: 01/08/2008 °[employer]® Patient Information ©2015 [employer]. This information is not intended to replace advice given to you by your health care provider. Make sure you discuss any questions you have with your health care provider. ° °

## 2013-11-22 NOTE — MAU Note (Signed)
Patient states she has been on BCP's for about one year and they are not working well for her. States she has two periods per month. Thinks she has had a tampon stuck for about one week. Abdominal cramping started this am. States she will sometime feel dizzy.

## 2013-11-24 DIAGNOSIS — F1220 Cannabis dependence, uncomplicated: Secondary | ICD-10-CM

## 2013-11-24 HISTORY — DX: Cannabis dependence, uncomplicated: F12.20

## 2013-11-24 LAB — GC/CHLAMYDIA PROBE AMP
CT PROBE, AMP APTIMA: NEGATIVE
GC PROBE AMP APTIMA: NEGATIVE

## 2013-11-30 ENCOUNTER — Inpatient Hospital Stay (HOSPITAL_COMMUNITY)
Admission: AD | Admit: 2013-11-30 | Discharge: 2013-12-01 | Discharge status: Against Medical Advice | Payer: 59 | Source: Ambulatory Visit | Attending: Obstetrics & Gynecology | Admitting: Obstetrics & Gynecology

## 2013-12-01 NOTE — MAU Note (Signed)
Not in lobby

## 2013-12-09 ENCOUNTER — Inpatient Hospital Stay (HOSPITAL_COMMUNITY)
Admission: AD | Admit: 2013-12-09 | Discharge: 2013-12-09 | Disposition: A | Discharge status: Home / Self Care | Payer: BC Managed Care – PPO | Source: Ambulatory Visit | Attending: Obstetrics & Gynecology | Admitting: Obstetrics & Gynecology

## 2013-12-09 ENCOUNTER — Encounter (HOSPITAL_COMMUNITY): Payer: Self-pay | Admitting: *Deleted

## 2013-12-09 DIAGNOSIS — R5381 Other malaise: Secondary | ICD-10-CM | POA: Diagnosis not present

## 2013-12-09 DIAGNOSIS — R197 Diarrhea, unspecified: Secondary | ICD-10-CM | POA: Insufficient documentation

## 2013-12-09 DIAGNOSIS — R630 Anorexia: Secondary | ICD-10-CM | POA: Insufficient documentation

## 2013-12-09 DIAGNOSIS — Z3202 Encounter for pregnancy test, result negative: Secondary | ICD-10-CM | POA: Insufficient documentation

## 2013-12-09 DIAGNOSIS — N644 Mastodynia: Secondary | ICD-10-CM | POA: Diagnosis not present

## 2013-12-09 DIAGNOSIS — R5383 Other fatigue: Secondary | ICD-10-CM | POA: Diagnosis not present

## 2013-12-09 DIAGNOSIS — R112 Nausea with vomiting, unspecified: Secondary | ICD-10-CM | POA: Diagnosis not present

## 2013-12-09 DIAGNOSIS — R634 Abnormal weight loss: Secondary | ICD-10-CM | POA: Diagnosis not present

## 2013-12-09 DIAGNOSIS — F17200 Nicotine dependence, unspecified, uncomplicated: Secondary | ICD-10-CM | POA: Insufficient documentation

## 2013-12-09 DIAGNOSIS — O99891 Other specified diseases and conditions complicating pregnancy: Secondary | ICD-10-CM | POA: Diagnosis present

## 2013-12-09 DIAGNOSIS — Z3009 Encounter for other general counseling and advice on contraception: Secondary | ICD-10-CM

## 2013-12-09 LAB — POCT PREGNANCY, URINE: Preg Test, Ur: NEGATIVE

## 2013-12-09 NOTE — MAU Provider Note (Signed)
History     CSN: [id number]  Arrival date and time: 12/09/13 1315   First Provider Initiated Contact with Patient 12/09/13 1550      Chief Complaint  Patient presents with  . Emesis  . Blurred Vision   HPI Comments: Elizabeth [name] is a 23 year old 981P0010 female who presents today because she thought that she was pregnant. LMP was 11/10/2013. She endorses fatigue, weight loss and decreased appetite as well as nausea, vomiting, diarrhea, constipation, breast tenderness and headache for the last 2 weeks. Denies abdominal pain. Patient states she discontinued OCPs due to irregular bleeding. She expressed interest in Mirena or Nexplanon for birth control and inquired about prices for both.  Emesis  Associated symptoms include diarrhea and weight loss. Pertinent negatives include no abdominal pain, chills or fever.      Past Medical History  Diagnosis Date  . Depression     Past Surgical History  Procedure Laterality Date  . Right foot surgery    . Abscess drainage      left groin  . Tonsillectomy and adenoidectomy    . Heel spur surgery      History reviewed. No pertinent family history.  History  Substance Use Topics  . Smoking status: Current Every Day Smoker -- 0.25 packs/day  . Smokeless tobacco: Never Used  . Alcohol Use: 2.4 oz/week    4 Glasses of wine per week     Comment: occasional    Allergies:  Allergies  Allergen Reactions  . Depakote [Divalproex Sodium] Anaphylaxis and Other (See Comments)    "Almost died"  . Penicillins Anaphylaxis    Prescriptions prior to admission  Medication Sig Dispense Refill  . Cyanocobalamin (B-12 PO) Take 2 tablets by mouth every other day. Takes 2 B-12 Vitamin Gummies      . doxycycline (VIBRAMYCIN) 100 MG capsule Take 1 capsule (100 mg total) by mouth 2 (two) times daily.  20 capsule  0  . ibuprofen (ADVIL,MOTRIN) 200 MG tablet Take 400 mg by mouth every 6 (six) hours as needed for pain.      [name]. loratadine-pseudoephedrine  (CLARITIN-D 24-HOUR) 10-240 MG per 24 hr tablet Take 1 tablet by mouth daily as needed for allergies.      . Lurasidone HCl (LATUDA) 20 MG TABS Take 20 mg by mouth at bedtime.      . norethindrone-ethinyl estradiol (JUNEL FE,GILDESS FE,LOESTRIN FE) 1-20 MG-MCG tablet Take 1 tablet by mouth daily.  1 Package  0    Review of Systems  Constitutional: Positive for weight loss and malaise/fatigue. Negative for fever and chills.  Gastrointestinal: Positive for nausea, vomiting, diarrhea and constipation. Negative for abdominal pain.   Physical Exam   Blood pressure 128/77, pulse 80, temperature 98.3 F (36.8 C), temperature source Oral, resp. rate 16, height 5\' 5"  (1.651 m), weight 75.297 kg (166 lb), last menstrual period 11/10/2013.  Physical Exam  Constitutional: She is oriented to person, place, and time. She appears well-developed and well-nourished.  Cardiovascular: Normal rate, regular rhythm and normal heart sounds.   Respiratory: Effort normal and breath sounds normal. She has no wheezes. She has no rales.  GI: Soft. Bowel sounds are normal. She exhibits no distension and no mass. There is no tenderness. There is no rebound and no guarding.  Neurological: She is alert and oriented to person, place, and time.  Skin: Skin is warm and dry.  Psychiatric:  Patient reluctant to share information and is not very cooperative. Nurse reports patient engaging  in loud verbal confrontation with her mother.    MAU Course  Procedures  MDM Urine Pregnancy test- Negative Pt in a hurry to leave Assessment and Plan  Patient is healthy. She was instructed to follow up with her provider about birth control options and to take Excedrin migraine for headache prn. No further interventions are needed at this time.  Pt seen with Letta PateAlyssa Sanders, PA-S and I agree with her evaluation and management  Pamelia HoitSusan Genieve Ramaswamy, WHNP-BC Letta PateSanders, Alyssa PA-S 12/09/2013, 4:04 PM

## 2013-12-09 NOTE — MAU Note (Signed)
Pt did not pee as instructed when first used restroom, when brought back.  Explained will need a urine at some time, she will need to go back to the lobby.  Pt said I can't go to a room, explained there were pt's ahead of her waiting, said- I am going to leave- I can't wait for this

## 2013-12-09 NOTE — MAU Note (Signed)
DB adm clerk, called back and said pt left, "I am not waiting"

## 2013-12-09 NOTE — MAU Note (Signed)
Assumed care of patient.

## 2013-12-09 NOTE — MAU Note (Addendum)
Thinks she is preg, did not do a pregnancy test, "II go to the doctor for every thing".. When asked reason , boy friend did not use a condom. Pt denies any problems

## 2013-12-11 NOTE — MAU Provider Note (Signed)
Attestation of Attending Supervision of Advanced Practitioner (CNM/NP): Evaluation and management procedures were performed by the Advanced Practitioner under my supervision and collaboration. I have reviewed the Advanced Practitioner's note and chart, and I agree with the management and plan.  Perpetua Elling H. 4:00 PM

## 2013-12-14 ENCOUNTER — Encounter (HOSPITAL_COMMUNITY): Payer: Self-pay | Admitting: Emergency Medicine

## 2013-12-14 ENCOUNTER — Emergency Department (HOSPITAL_COMMUNITY)
Admission: EM | Admit: 2013-12-14 | Discharge: 2013-12-15 | Disposition: A | Discharge status: Home / Self Care | Payer: BC Managed Care – PPO | Attending: Emergency Medicine | Admitting: Emergency Medicine

## 2013-12-14 DIAGNOSIS — F11220 Opioid dependence with intoxication, uncomplicated: Secondary | ICD-10-CM

## 2013-12-14 DIAGNOSIS — F1120 Opioid dependence, uncomplicated: Secondary | ICD-10-CM | POA: Diagnosis present

## 2013-12-14 DIAGNOSIS — F411 Generalized anxiety disorder: Secondary | ICD-10-CM | POA: Insufficient documentation

## 2013-12-14 DIAGNOSIS — R443 Hallucinations, unspecified: Secondary | ICD-10-CM | POA: Diagnosis not present

## 2013-12-14 DIAGNOSIS — F17200 Nicotine dependence, unspecified, uncomplicated: Secondary | ICD-10-CM | POA: Insufficient documentation

## 2013-12-14 DIAGNOSIS — F1920 Other psychoactive substance dependence, uncomplicated: Secondary | ICD-10-CM

## 2013-12-14 DIAGNOSIS — R45850 Homicidal ideations: Secondary | ICD-10-CM | POA: Diagnosis not present

## 2013-12-14 DIAGNOSIS — F319 Bipolar disorder, unspecified: Secondary | ICD-10-CM | POA: Insufficient documentation

## 2013-12-14 DIAGNOSIS — Z79899 Other long term (current) drug therapy: Secondary | ICD-10-CM | POA: Insufficient documentation

## 2013-12-14 DIAGNOSIS — F329 Major depressive disorder, single episode, unspecified: Secondary | ICD-10-CM | POA: Insufficient documentation

## 2013-12-14 DIAGNOSIS — F309 Manic episode, unspecified: Secondary | ICD-10-CM

## 2013-12-14 DIAGNOSIS — F3289 Other specified depressive episodes: Secondary | ICD-10-CM | POA: Diagnosis not present

## 2013-12-14 DIAGNOSIS — R4587 Impulsiveness: Secondary | ICD-10-CM | POA: Insufficient documentation

## 2013-12-14 DIAGNOSIS — Z880 Allergy status to penicillin: Secondary | ICD-10-CM | POA: Insufficient documentation

## 2013-12-14 DIAGNOSIS — F22 Delusional disorders: Secondary | ICD-10-CM | POA: Diagnosis not present

## 2013-12-14 DIAGNOSIS — R45851 Suicidal ideations: Secondary | ICD-10-CM | POA: Diagnosis present

## 2013-12-14 HISTORY — DX: Bipolar disorder, unspecified: F31.9

## 2013-12-14 LAB — RAPID URINE DRUG SCREEN, HOSP PERFORMED
Amphetamines: NOT DETECTED
Barbiturates: NOT DETECTED
Benzodiazepines: NOT DETECTED
Cocaine: NOT DETECTED
Opiates: NOT DETECTED
TETRAHYDROCANNABINOL: POSITIVE — AB

## 2013-12-14 LAB — CBC
HEMATOCRIT: 38.9 % (ref 36.0–46.0)
HEMOGLOBIN: 13.8 g/dL (ref 12.0–15.0)
MCH: 31.2 pg (ref 26.0–34.0)
MCHC: 35.5 g/dL (ref 30.0–36.0)
MCV: 87.8 fL (ref 78.0–100.0)
Platelets: 246 10*3/uL (ref 150–400)
RBC: 4.43 MIL/uL (ref 3.87–5.11)
RDW: 12.3 % (ref 11.5–15.5)
WBC: 6.1 10*3/uL (ref 4.0–10.5)

## 2013-12-14 LAB — COMPREHENSIVE METABOLIC PANEL
ALBUMIN: 4.2 g/dL (ref 3.5–5.2)
ALT: 9 U/L (ref 0–35)
ANION GAP: 15 (ref 5–15)
AST: 17 U/L (ref 0–37)
Alkaline Phosphatase: 67 U/L (ref 39–117)
BUN: 6 mg/dL (ref 6–23)
CALCIUM: 9.8 mg/dL (ref 8.4–10.5)
CO2: 19 mEq/L (ref 19–32)
CREATININE: 0.79 mg/dL (ref 0.50–1.10)
Chloride: 104 mEq/L (ref 96–112)
GFR calc non Af Amer: >90 mL/min (ref >90)
GLUCOSE: 105 mg/dL — AB (ref 70–99)
Potassium: 3.6 mEq/L — ABNORMAL LOW (ref 3.7–5.3)
Sodium: 138 mEq/L (ref 137–147)
Total Bilirubin: 0.3 mg/dL (ref 0.3–1.2)
Total Protein: 8.3 g/dL (ref 6.0–8.3)

## 2013-12-14 LAB — PREGNANCY, URINE: Preg Test, Ur: NEGATIVE

## 2013-12-14 LAB — ETHANOL

## 2013-12-14 LAB — SALICYLATE LEVEL: Salicylate Lvl: <2 mg/dL — ABNORMAL LOW (ref 2.8–20.0)

## 2013-12-14 LAB — ACETAMINOPHEN LEVEL: Acetaminophen (Tylenol), Serum: <15 ug/mL (ref 10–30)

## 2013-12-14 MED ORDER — LURASIDONE HCL 20 MG PO TABS
20.0000 mg | ORAL_TABLET | Freq: Every day | ORAL | Status: DC
  Administered 2013-12-14: 20 mg via ORAL
  Filled 2013-12-14 (×3): qty 1

## 2013-12-14 MED ORDER — ALUM & MAG HYDROXIDE-SIMETH 200-200-20 MG/5ML PO SUSP: 30.0000 mL | ORAL | Status: DC | PRN

## 2013-12-14 MED ORDER — IBUPROFEN 200 MG PO TABS: 600.0000 mg | ORAL_TABLET | Freq: Three times a day (TID) | ORAL | Status: DC | PRN

## 2013-12-14 MED ORDER — HYDROXYZINE HCL 25 MG PO TABS
50.0000 mg | ORAL_TABLET | Freq: Once | ORAL | Status: AC
  Administered 2013-12-14: 50 mg via ORAL
  Filled 2013-12-14: qty 2

## 2013-12-14 MED ORDER — LORAZEPAM 1 MG PO TABS
1.0000 mg | ORAL_TABLET | Freq: Three times a day (TID) | ORAL | Status: DC | PRN
  Filled 2013-12-14: qty 1

## 2013-12-14 MED ORDER — ZIPRASIDONE MESYLATE 20 MG IM SOLR: 20.0000 mg | INTRAMUSCULAR | Status: DC | PRN

## 2013-12-14 MED ORDER — DOXYCYCLINE HYCLATE 100 MG PO TABS
100.0000 mg | ORAL_TABLET | Freq: Two times a day (BID) | ORAL | Status: DC
  Administered 2013-12-14 – 2013-12-15 (×2): 100 mg via ORAL
  Filled 2013-12-14 (×2): qty 1

## 2013-12-14 MED ORDER — NICOTINE 21 MG/24HR TD PT24
21.0000 mg | MEDICATED_PATCH | Freq: Every day | TRANSDERMAL | Status: DC
  Administered 2013-12-14 – 2013-12-15 (×2): 21 mg via TRANSDERMAL
  Filled 2013-12-14 (×2): qty 1

## 2013-12-14 MED ORDER — ALBUTEROL SULFATE HFA 108 (90 BASE) MCG/ACT IN AERS
2.0000 | INHALATION_SPRAY | Freq: Four times a day (QID) | RESPIRATORY_TRACT | Status: DC | PRN
Start: 2013-12-14 — End: 2013-12-15

## 2013-12-14 MED ORDER — ACETAMINOPHEN 325 MG PO TABS
650.0000 mg | ORAL_TABLET | ORAL | Status: DC | PRN
Start: 2013-12-14 — End: 2013-12-15

## 2013-12-14 MED ORDER — OLANZAPINE 5 MG PO TBDP
5.0000 mg | ORAL_TABLET | Freq: Three times a day (TID) | ORAL | Status: DC | PRN
  Administered 2013-12-15: 5 mg via ORAL
  Filled 2013-12-14: qty 1

## 2013-12-14 MED ORDER — LORAZEPAM 1 MG PO TABS
1.0000 mg | ORAL_TABLET | ORAL | Status: AC | PRN
  Administered 2013-12-14: 1 mg via ORAL

## 2013-12-14 MED ORDER — ONDANSETRON HCL 4 MG PO TABS
4.0000 mg | ORAL_TABLET | Freq: Three times a day (TID) | ORAL | Status: DC | PRN
  Filled 2013-12-14: qty 1

## 2013-12-14 NOTE — BH Assessment (Signed)
Assessment Note  Elizabeth [name] is an 23 y.o. female. Patient was brought into the ED by her parents b/o increased anxeity, drug use, and frequent mood swings.  Patient currently denies SI/HI, hallucinations, and other self-injurious behaviros.  Patient reports symptoms such as increased anxiety, poor concentration, racing thoughts, rapid mood swings, decreased sleeping patterns, and decreased appetite.  Patient reports smoking marijuana daily about an ounce, snorting percocets an unknown amount, and cocaine and unknown.  Patient reports current withdrawal symptoms include increased anxiety.    CSW spoke with the patient's parents as they were in the room during the assessment per the patient's request.  Mother reports that the patient was clean until 3 weeks ago and since that time she quickly decline in mental status.  Mother reports that the patient is very irritable, laughing inappropriately, crying, and starting multiple conversations but never completing a thought.  Patient is being monitored by Dr Jean Rosenthal with [facility] and the next appointment is a week from today.    CSW ran patient with Dr. Tawni Carnes it is recommended to refer the patient for observation if bed is available.    Axis I: Substance Induced Mood Disorder and Cannabis Abuse, Opiate abuse, and Cocaine Abuse Axis II: Deferred Axis III:  Past Medical History  Diagnosis Date  . Depression   . Bipolar 1 disorder    Axis IV: economic problems, housing problems, other psychosocial or environmental problems, problems related to social environment, problems with access to health care services and problems with primary support group Axis V: 41-50 serious symptoms  Past Medical History:  Past Medical History  Diagnosis Date  . Depression   . Bipolar 1 disorder     Past Surgical History  Procedure Laterality Date  . Right foot surgery    . Abscess drainage      left groin  . Tonsillectomy and adenoidectomy    . Heel spur  surgery      Family History: History reviewed. No pertinent family history.  Social History:  reports that she has been smoking.  She has never used smokeless tobacco. She reports that she drinks about 2.4 ounces of alcohol per week. She reports that she uses illicit drugs (Benzodiazepines, Oxycodone, Cocaine, and Marijuana).  Additional Social History:     CIWA: CIWA-Ar BP: 110/86 mmHg Pulse Rate: 100 COWS:    Allergies:  Allergies  Allergen Reactions  . Depakote [Divalproex Sodium] Anaphylaxis and Other (See Comments)    "Almost died"  . Penicillins Anaphylaxis    Home Medications:  (Not in a hospital admission)  OB/GYN Status:  Patient's last menstrual period was 11/03/2013.  General Assessment Data Location of Assessment: [facility] ACT Assessment: Yes Is this a Tele or Face-to-Face Assessment?: Face-to-Face Is this an Initial Assessment or a Re-assessment for this encounter?: Initial Assessment Living Arrangements: Parent Can pt return to current living arrangement?: Yes Admission Status: Voluntary Is patient capable of signing voluntary admission?: Yes Transfer from: Home Referral Source: Self/Family/Friend  Medical Screening Exam [facility] Walk-in ONLY) Medical Exam completed: Yes  [facility] Crisis Care Plan Living Arrangements: Parent Name of Psychiatrist: Dr. Jean Rosenthal  Name of Therapist: none  Education Status Is patient currently in school?: No Highest grade of school patient has completed: 12th  Risk to self Suicidal Ideation: No-Not Currently/Within Last 6 Months Suicidal Intent: No-Not Currently/Within Last 6 Months Is patient at risk for suicide?: No Suicidal Plan?: No-Not Currently/Within Last 6 Months Access to Means: No What has been your use of drugs/alcohol  within the last 12 months?: THC, cocaine, percocets (Per patient report) Previous Attempts/Gestures: No Intentional Self Injurious Behavior: None Family Suicide History: Unknown Recent stressful life  event(s): Conflict (Comment);Loss (Comment);Financial Problems;Other (Comment) Depression: Yes Depression Symptoms: Tearfulness;Guilt;Loss of interest in usual pleasures;Feeling angry/irritable Substance abuse history and/or treatment for substance abuse?: Yes  Risk to Others Homicidal Ideation: No-Not Currently/Within Last 6 Months Thoughts of Harm to Others: No-Not Currently Present/Within Last 6 Months Access to Homicidal Means: No History of harm to others?: No Assessment of Violence: None Noted Does patient have access to weapons?: No Criminal Charges Pending?: No Does patient have a court date: No  Psychosis Hallucinations: None noted Delusions: None noted  Mental Status Report Appear/Hygiene: In hospital gown;In scrubs Eye Contact: Fair Motor Activity: Restlessness Speech: Aggressive;Pressured;Tangential Level of Consciousness: Alert;Irritable Mood: Labile Affect: Anxious;Labile Anxiety Level: Moderate Thought Processes: Tangential Judgement: Impaired Orientation: Person;Place;Situation Obsessive Compulsive Thoughts/Behaviors: None  Cognitive Functioning Concentration: Poor Memory: Remote Intact;Recent Impaired IQ: Average Insight: Poor Impulse Control: Poor Appetite: Poor Sleep: Decreased Vegetative Symptoms: None  ADLScreening [facility] Assessment Services) Patient's cognitive ability adequate to safely complete daily activities?: Yes Patient able to express need for assistance with ADLs?: Yes Independently performs ADLs?: Yes (appropriate for developmental age)  Prior Inpatient Therapy Prior Inpatient Therapy: Yes [facility]  Prior Outpatient Therapy Prior Outpatient Therapy: Yes Prior Therapy Facilty/Provider(s): [facility]  ADL Screening (condition at time of admission) Patient's cognitive ability adequate to safely complete daily activities?: Yes Patient able to express need for assistance with ADLs?: Yes Independently  performs ADLs?: Yes (appropriate for developmental age)  Home Assistive Devices/Equipment Home Assistive Devices/Equipment: None      Values / Beliefs Cultural Requests During Hospitalization: None Spiritual Requests During Hospitalization: None        Additional Information 1:1 In Past 12 Months?: No CIRT Risk: No Elopement Risk: No Does patient have medical clearance?: Yes     Disposition:  Disposition Initial Assessment Completed for this Encounter: Yes Disposition of Patient: Inpatient treatment program Type of inpatient treatment program: Adult Other disposition(s): Other (Comment) (Observation)  On Site Evaluation by:   Reviewed with Physician:    Maryelizabeth Rowanorbett, Jacoby Ritsema A 12/14/2013 6:52 PM

## 2013-12-14 NOTE — ED Notes (Signed)
Pt states that she needs help with her mental health. Pt very restless in triage.  Pt states that she can't stop saying the "F word" in her head.  States that she has thoughts of hurting herself and others with no plan.  Hx of bipolar. Mom states that she will be laughing and then crying.

## 2013-12-14 NOTE — ED Notes (Signed)
Bed: [facility]39 Expected date:  Expected time:  Means of arrival:  Comments: Triage 4 

## 2013-12-14 NOTE — ED Notes (Signed)
Pt hollering stating she feels very anxious and needs something for her nerves and anxiety.  NP Alberteen SamFran Hobson notified, meds given.  Pt having episodes of tearfulness, pt reassured that she is in a safe place and being monitored for safety.

## 2013-12-14 NOTE — ED Provider Notes (Signed)
Patient is agitated. She states that others are out to kill her. Stating "Janelle Flooraomi will kill me if I drive [address]" patient is mildly agitated, cooperative. Glasgow Coma Score 15. Ambulates without difficulty. .edthis Involuntary commitment papers for psychiatric evaluation first exam forms filled out by me as patient is felt to be a danger to herself or others. Results for orders placed during the hospital encounter of 12/14/13  ACETAMINOPHEN LEVEL      Result Value Ref Range   Acetaminophen (Tylenol), Serum <15.0  10 - 30 ug/mL  CBC      Result Value Ref Range   WBC 6.1  4.0 - 10.5 K/uL   RBC 4.43  3.87 - 5.11 MIL/uL   Hemoglobin 13.8  12.0 - 15.0 g/dL   HCT 40.938.9  81.136.0 - 91.446.0 %   MCV 87.8  78.0 - 100.0 fL   MCH 31.2  26.0 - 34.0 pg   MCHC 35.5  30.0 - 36.0 g/dL   RDW 78.212.3  95.611.5 - 21.315.5 %   Platelets 246  150 - 400 K/uL  COMPREHENSIVE METABOLIC PANEL      Result Value Ref Range   Sodium 138  137 - 147 mEq/L   Potassium 3.6 (*) 3.7 - 5.3 mEq/L   Chloride 104  96 - 112 mEq/L   CO2 19  19 - 32 mEq/L   Glucose, Bld 105 (*) 70 - 99 mg/dL   BUN 6  6 - 23 mg/dL   Creatinine, Ser 0.860.79  0.50 - 1.10 mg/dL   Calcium 9.8  8.4 - 57.810.5 mg/dL   Total Protein 8.3  6.0 - 8.3 g/dL   Albumin 4.2  3.5 - 5.2 g/dL   AST 17  0 - 37 U/L   ALT 9  0 - 35 U/L   Alkaline Phosphatase 67  39 - 117 U/L   Total Bilirubin 0.3  0.3 - 1.2 mg/dL   GFR calc non Af Amer >90  >90 mL/min   GFR calc Af Amer >90  >90 mL/min   Anion gap 15  5 - 15  ETHANOL      Result Value Ref Range   Alcohol, Ethyl (B) <11  0 - 11 mg/dL  SALICYLATE LEVEL      Result Value Ref Range   Salicylate Lvl <2.0 (*) 2.8 - 20.0 mg/dL   No results found.   Doug SouSam Atara Paterson, MD 12/14/13 [phone]

## 2013-12-14 NOTE — ED Provider Notes (Signed)
CSN: [id number]     Arrival date & time 12/14/13  1539 History   First MD Initiated Contact with Patient 12/14/13 1621     Chief Complaint  Patient presents with  . Manic   . Suicidal   . Homicidal      (Consider location/radiation/quality/duration/timing/severity/associated sxs/prior Treatment) HPI Elizabeth [name] Is a 23 year old female with a past medical history of depression and bipolar disorder brought in by her family for a radical behavior. Her mother states that she was missing for approximately 2 weeks. Since that time she has been emotionally labile. She'll go from laughing to stop and within seconds. Her mother states that she has not slept in several days. She has rapid pressured speech and has been stating nonsensical statements to her parents. Her mother states that today she told her father she's been seeing a ghost. The patient claims she has been compliant with her medications. She is reluctant to discuss her hallucinations because she wants to know "why do you all just put people always like that?" Patient states she has been compliant with her medications but she did not note that street drugs could make her do this. She states she has been using opiates and marijuana. She states she has been in a drug program that no one can know about and that's why she was gone for 2 weeks. The patient also states that she has been thinking of hurting herself and others around her but has no definite plan.   Past Medical History  Diagnosis Date  . Depression   . Bipolar 1 disorder    Past Surgical History  Procedure Laterality Date  . Right foot surgery    . Abscess drainage      left groin  . Tonsillectomy and adenoidectomy    . Heel spur surgery     History reviewed. No pertinent family history. History  Substance Use Topics  . Smoking status: Current Every Day Smoker -- 0.25 packs/day  . Smokeless tobacco: Never Used  . Alcohol Use: 2.4 oz/week    4 Glasses of wine per  week     Comment: occasional   OB History   Grav Para Term Preterm Abortions TAB SAB Ect Mult Living   1    1 1          Review of Systems  Unable to perform ROS: Psychiatric disorder       Allergies  Depakote and Penicillins  Home Medications   Prior to Admission medications   Medication Sig Start Date End Date Taking? Authorizing Provider  albuterol (PROVENTIL HFA;VENTOLIN HFA) 108 (90 BASE) MCG/ACT inhaler Inhale 2 puffs into the lungs every 6 (six) hours as needed for wheezing or shortness of breath.    Historical Provider, MD  Cyanocobalamin (B-12 PO) Take 2 tablets by mouth every other day. Takes 2 B-12 Vitamin Gummies    Historical Provider, MD  doxycycline (VIBRAMYCIN) 100 MG capsule Take 1 capsule (100 mg total) by mouth 2 (two) times daily. 11/22/13   Delbert PhenixLinda M Barefoot, NP  Lurasidone HCl (LATUDA) 20 MG TABS Take 20 mg by mouth at bedtime.    Historical Provider, MD  Prenatal Vit-Fe Fumarate-FA (PRENATAL MULTIVITAMIN) TABS tablet Take 1 tablet by mouth daily at 12 noon.    Historical Provider, MD   BP 110/86  Pulse 100  Temp(Src) 98 F (36.7 C) (Oral)  Resp 18  SpO2 100%  LMP 11/03/2013 Physical Exam  Constitutional: She is oriented to person, place, and time.  She appears well-developed and well-nourished. No distress.  HENT:  Head: Normocephalic and atraumatic.  Eyes: Conjunctivae are normal. No scleral icterus.  Neck: Normal range of motion.  Cardiovascular: Normal rate, regular rhythm and normal heart sounds.  Exam reveals no gallop and no friction rub.   No murmur heard. Pulmonary/Chest: Effort normal and breath sounds normal. No respiratory distress.  Abdominal: Soft. Bowel sounds are normal. She exhibits no distension and no mass. There is no tenderness. There is no guarding.  Neurological: She is alert and oriented to person, place, and time.  Skin: Skin is warm and dry. She is not diaphoretic.  Psychiatric: Her mood appears anxious. Her affect is labile.  Her speech is rapid and/or pressured and tangential. She is actively hallucinating. Thought content is delusional. She expresses impulsivity. She expresses homicidal and suicidal ideation.    ED Course  Procedures (including critical care time) Labs Review Labs Reviewed  ACETAMINOPHEN LEVEL  CBC  COMPREHENSIVE METABOLIC PANEL  ETHANOL  SALICYLATE LEVEL  URINE RAPID DRUG SCREEN (HOSP PERFORMED)    Imaging Review No results found.   EKG Interpretation None      MDM   Final diagnoses:  Manic psychosis    5:09 PM BP 110/86  Pulse 100  Temp(Src) 98 F (36.7 C) (Oral)  Resp 18  SpO2 100%  LMP 11/03/2013 Patient with apparent Manic behavior with psychotic overtones. She is confused, tangential rapid speech, at times nonsensical. Mother is unsure if she is compliant with his medication.     6:12 PM Patient seen in shared visit with Dr. Ethelda Chick. (please see his note for details). IVC paperwork initiated.  Will need inpatient admission  Arthor Captain, PA-C 12/17/13 2106

## 2013-12-15 ENCOUNTER — Encounter (HOSPITAL_COMMUNITY): Payer: Self-pay | Admitting: Registered Nurse

## 2013-12-15 DIAGNOSIS — F319 Bipolar disorder, unspecified: Secondary | ICD-10-CM | POA: Diagnosis not present

## 2013-12-15 DIAGNOSIS — F1920 Other psychoactive substance dependence, uncomplicated: Secondary | ICD-10-CM

## 2013-12-15 DIAGNOSIS — F1120 Opioid dependence, uncomplicated: Secondary | ICD-10-CM | POA: Diagnosis present

## 2013-12-15 DIAGNOSIS — F1994 Other psychoactive substance use, unspecified with psychoactive substance-induced mood disorder: Secondary | ICD-10-CM

## 2013-12-15 DIAGNOSIS — F1910 Other psychoactive substance abuse, uncomplicated: Secondary | ICD-10-CM

## 2013-12-15 HISTORY — DX: Opioid dependence, uncomplicated: F11.20

## 2013-12-15 NOTE — ED Notes (Signed)
Contacted EDP, Dr. Juleen ChinaKohut, relaying patient's request for prn vistaril. Patient currently refusing Zyprexa. Dr. Juleen ChinaKohut did not give orders for vistaril.

## 2013-12-15 NOTE — [facility] Suicide Risk Assessment (Cosign Needed)
Suicide Risk Assessment  Discharge Assessment     Demographic Factors:  Black female  Total Time spent with patient: 30 minutes  Psychiatric Specialty Exam:      Blood pressure 112/77, pulse 83, temperature 98.3 F (36.8 C), temperature source Oral, resp. rate 20, last menstrual period 11/03/2013, SpO2 100.00%.There is no weight on file to calculate BMI.   General Appearance: Casual and Fairly Groomed   Eye Contact:: Good   Speech: Clear and Coherent and Normal Rate   Volume: Normal   Mood: "I just need help with my drug problem"   Affect: Congruent   Thought Process: Circumstantial and Goal Directed   Orientation: Full (Time, Place, and Person)   Thought Content: WDL   Suicidal Thoughts: No   Homicidal Thoughts: No   Memory: Immediate; Good  Recent; Good  Remote; Good   Judgement: Fair   Insight: Present   Psychomotor Activity: Normal   Concentration: Fair   Recall: Good   Fund of Knowledge:Good   Language: Good   Akathisia: No   Handed: Right   AIMS (if indicated):   Assets: Communication Skills  Desire for Improvement  Housing  Social Support   Sleep:   Musculoskeletal:  Strength & Muscle Tone: within normal limits  Gait & Station: normal  Patient leans: N/A   Mental Status Per Nursing Assessment::   On Admission:     Current Mental Status by Physician: Patient denies suicidal/homicidal ideation, psychosis, and paranoia  Loss Factors: NA  Historical Factors: NA  Risk Reduction Factors:   Sense of responsibility to family and Positive social support  Continued Clinical Symptoms:  Alcohol/Substance Abuse/Dependencies  Cognitive Features That Contribute To Risk:  None noted    Suicide Risk:  Minimal: No identifiable suicidal ideation.  Patients presenting with no risk factors but with morbid ruminations; may be classified as minimal risk based on the severity of the depressive symptoms  Discharge Diagnoses: AXIS I: Substance Abuse and  Substance Induced Mood Disorder  AXIS II: Deferred  AXIS III:  Past Medical History   Diagnosis  Date   .  Depression    .  Bipolar 1 disorder     AXIS IV: other psychosocial or environmental problems  AXIS V: 61-70 mild symptoms     Plan Of Care/Follow-up recommendations:  Activity:  Resume ususal activity Diet:  Resume usual diet  Is patient on multiple antipsychotic therapies at discharge:  No   Has Patient had three or more failed trials of antipsychotic monotherapy by history:  No  Recommended Plan for Multiple Antipsychotic Therapies: NA    Rankin, [name], FNP-BC 12/15/2013, 2:35 PM

## 2013-12-15 NOTE — BH Assessment (Signed)
Pt referred to the follwing facilities:   [facility]- Per Tamika requested that referral information be faxed for review as bed availability for today has not been determined for OV as of 0920. Writer faxed referral information.  [facility]-@9 :25am no answer.  [facility]abelle [facility]ana [facility] [employer]  [facility]- Per Alla Feelinghampion whom is unable to confirm bed status but is requesting that pt referral information be faxed. Writer faxed referral information.  [facility]-Per Darlene no beds available as they are currently pending internal transfers that are priority.  [facility]Per Cordelia PenSherry @capacity  and they don't have a detox program.  [facility]- Per SanfordAmbika beds available and requesting that patient referral information be faxed. This Clinical research associatewriter faxed referral information to hospital.     Glorious PeachNajah Breelyn Icard, MS, LCASA Assessment Counselor

## 2013-12-15 NOTE — ED Notes (Signed)
D: Patient pacing in hallway and stating in regard to another patient, "That white bitch; she's KKK." Patient also stated, "I need to take that because I am one of the professionals."  A: Patient administered 5 mg Zyprexa po for agitation. Encouraged to rest in room. R: Patient remained in room momentarily and then telephoned family member prior to returning to room.

## 2013-12-15 NOTE — ED Notes (Signed)
Late entry note- Spoke with patient"s mother concerning pending d/c.  Informed of provider"s decision to d/c with resources including an NA and outside referal resources.  [place] suggested for caller and she informed this Clinical research associatewriter that she did attend meetings.  Mother of patient upset and verbally aggressive with this Clinical research associatewriter.  Informed mother that she would be given a bus pass.  Patient also spoke with mother about plans for d/c. Patient verbalized understanding of all instructions.

## 2013-12-15 NOTE — Discharge Instructions (Signed)
Finding Treatment for Alcohol and Drug Addiction It can be hard to find the right place to get professional treatment. Here are some important things to consider:  There are different types of treatment to choose from.  Some programs are live-in (residential) while others are not (outpatient). Sometimes a combination is offered.  No single type of program is right for everyone.  Most treatment programs involve a combination of education, counseling, and a 12-step, spiritually-based approach.  There are non-spiritually based programs (not 12-step).  Some treatment programs are government sponsored. They are geared for patients without private insurance.  Treatment programs can vary in many respects such as:  Cost and types of insurance accepted.  Types of on-site medical services offered.  Length of stay, setting, and size.  Overall philosophy of treatment. A person may need specialized treatment or have needs not addressed by all programs. For example, adolescents need treatment appropriate for their age. Other people have secondary disorders that must be managed as well. Secondary conditions can include mental illness, such as depression or diabetes. Often, a period of detoxification from alcohol or drugs is needed. This requires medical supervision and not all programs offer this. THINGS TO CONSIDER WHEN SELECTING A TREATMENT PROGRAM   Is the program certified by the appropriate government agency? Even private programs must be certified and employ certified professionals.  Does the program accept your insurance? If not, can a payment plan be set up?  Is the facility clean, organized, and well run? Do they allow you to speak with graduates who can share their treatment experience with you? Can you tour the facility? Can you meet with staff?  Does the program meet the full range of individual needs?  Does the treatment program address sexual orientation and physical disabilities?  Do they provide age, gender, and culturally appropriate treatment services?  Is treatment available in languages other than English?  Is long-term aftercare support or guidance encouraged and provided?  Is assessment of an individual's treatment plan ongoing to ensure it meets changing needs?  Does the program use strategies to encourage reluctant patients to remain in treatment long enough to increase the likelihood of success?  Does the program offer counseling (individual or group) and other behavioral therapies?  Does the program offer medicine as part of the treatment regimen, if needed?  Is there ongoing monitoring of possible relapse? Is there a defined relapse prevention program? Are services or referrals offered to family members to ensure they understand addiction and the recovery process? This would help them support the recovering individual.  Are 12-step meetings held at the center or is transport available for patients to attend outside meetings? In countries outside of the [place], see local directories for contact information for services in your area. Document Released: 04/20/2005 Document Revised: 08/14/2011 Document Reviewed: 10/31/2007 ExitCare Patient Information 2015 [employer]. This information is not intended to replace advice given to you by your health care provider. Make sure you discuss any questions you have with your health care provider.  

## 2013-12-15 NOTE — BH Assessment (Signed)
Per Tamika at [facility]/V patient is declined for admission  due to not meeting inpatient criteria.   Glorious PeachNajah Esabella Stockinger, MS, LCASA Assessment Counselor

## 2013-12-15 NOTE — ED Notes (Signed)
IVC rescinded. Patient discharged home.

## 2013-12-15 NOTE — BH Assessment (Signed)
Per [employer]uvon who is d/c patient from ER as patient does not meet inpatient treatment criteria. Pt was provided with Mental health, SA outpatient and crisis resources.   Elizabeth PeachNajah Bethani Brugger, MS, LCASA Assessment Counselor

## 2013-12-15 NOTE — Consult Note (Signed)
[facility] Consult   Reason for Consult:  Polysubstance abuse Referring Physician:  EDP  Elizabeth [name] is an 23 y.o. female. Total Time spent with patient: 45 minutes  Assessment: AXIS I:  Substance Abuse and Substance Induced Mood Disorder AXIS II:  Deferred AXIS III:   Past Medical History  Diagnosis Date  . Depression   . Bipolar 1 disorder    AXIS IV:  other psychosocial or environmental problems AXIS V:  61-70 mild symptoms  Plan:  No evidence of imminent risk to self or others at present.   Patient does not meet criteria for psychiatric inpatient admission. Supportive therapy provided about ongoing stressors. Discussed crisis plan, support from social network, calling 911, coming to the Emergency Department, and calling Suicide Hotline.  Subjective:   Elizabeth [name] is a 23 y.o. female patient.  HPI:  Patient states "I relapsed on everything.  I am trying to figure out where I need to be.  I want to get some help for my drug problem.  I am in AA and NA program now but I need more."  Patient denies suicidal/homicidal ideation, psychosis, and paranoia.  HPI Elements:   Location:  Polysubstance abuse. Quality:  daily use of illicit drugs. Severity:  daily use of illicit drugs. Timing:  several weeks.  Past Psychiatric History: Past Medical History  Diagnosis Date  . Depression   . Bipolar 1 disorder     reports that she has been smoking.  She has never used smokeless tobacco. She reports that she drinks about 2.4 ounces of alcohol per week. She reports that she uses illicit drugs (Benzodiazepines, Oxycodone, Cocaine, and Marijuana). History reviewed. No pertinent family history. Family History Substance Abuse: Yes, Describe: Family Supports: Yes, List: Living Arrangements: Parent Can pt return to current living arrangement?: Yes   Allergies:   Allergies  Allergen Reactions  . Depakote [Divalproex Sodium] Anaphylaxis and Other (See Comments)     "Almost died"  . Penicillins Anaphylaxis    ACT Assessment Complete:  Yes:    Educational Status    Risk to Self: Risk to self Suicidal Ideation: No-Not Currently/Within Last 6 Months Suicidal Intent: No-Not Currently/Within Last 6 Months Is patient at risk for suicide?: No Suicidal Plan?: No-Not Currently/Within Last 6 Months Access to Means: No What has been your use of drugs/alcohol within the last 12 months?: THC, cocaine, percocets (Per patient report) Previous Attempts/Gestures: No Intentional Self Injurious Behavior: None Family Suicide History: Unknown Recent stressful life event(s): Conflict (Comment);Loss (Comment);Financial Problems;Other (Comment) Depression: Yes Depression Symptoms: Tearfulness;Guilt;Loss of interest in usual pleasures;Feeling angry/irritable Substance abuse history and/or treatment for substance abuse?: Yes  Risk to Others: Risk to Others Homicidal Ideation: No-Not Currently/Within Last 6 Months Thoughts of Harm to Others: No-Not Currently Present/Within Last 6 Months Access to Homicidal Means: No History of harm to others?: No Assessment of Violence: None Noted Does patient have access to weapons?: No Criminal Charges Pending?: No Does patient have a court date: No  Abuse:    Prior Inpatient Therapy: Prior Inpatient Therapy Prior Inpatient Therapy: Yes Prior Therapy Facilty[facility]Provider[facility]): [facility]  Prior Outpatient Therapy: Prior Outpatient Therapy Prior Outpatient Therapy: Yes Prior Therapy Facilty/Provider(s): [facility]  Additional Information: Additional Information 1:1 In Past 12 Months?: No CIRT Risk: No Elopement Risk: No Does patient have medical clearance?: Yes                  Objective: Blood pressure 112/77, pulse 83, temperature 98.3 F (36.8 C), temperature  source Oral, resp. rate 20, last menstrual period 11/03/2013, SpO2 100.00%.There is no weight on file to calculate BMI. Results for orders placed during the  hospital encounter of 12/14/13 (from the past 72 hour(s))  ACETAMINOPHEN LEVEL     Status: None   Collection Time    12/14/13  5:07 PM      Result Value Ref Range   Acetaminophen (Tylenol), Serum <15.0  10 - 30 ug/mL   Comment:            THERAPEUTIC CONCENTRATIONS VARY     SIGNIFICANTLY. A RANGE OF 10-30     ug/mL MAY BE AN EFFECTIVE     CONCENTRATION FOR MANY PATIENTS.     HOWEVER, SOME ARE BEST TREATED     AT CONCENTRATIONS OUTSIDE THIS     RANGE.     ACETAMINOPHEN CONCENTRATIONS     >150 ug/mL AT 4 HOURS AFTER     INGESTION AND >50 ug/mL AT 12     HOURS AFTER INGESTION ARE     OFTEN ASSOCIATED WITH TOXIC     REACTIONS.  CBC     Status: None   Collection Time    12/14/13  5:07 PM      Result Value Ref Range   WBC 6.1  4.0 - 10.5 K/uL   RBC 4.43  3.87 - 5.11 MIL/uL   Hemoglobin 13.8  12.0 - 15.0 g/dL   HCT 38.9  36.0 - 46.0 %   MCV 87.8  78.0 - 100.0 fL   MCH 31.2  26.0 - 34.0 pg   MCHC 35.5  30.0 - 36.0 g/dL   RDW 12.3  11.5 - 15.5 %   Platelets 246  150 - 400 K/uL  COMPREHENSIVE METABOLIC PANEL     Status: Abnormal   Collection Time    12/14/13  5:07 PM      Result Value Ref Range   Sodium 138  137 - 147 mEq/L   Potassium 3.6 (*) 3.7 - 5.3 mEq/L   Chloride 104  96 - 112 mEq/L   CO2 19  19 - 32 mEq/L   Glucose, Bld 105 (*) 70 - 99 mg/dL   BUN 6  6 - 23 mg/dL   Creatinine, Ser 0.79  0.50 - 1.10 mg/dL   Calcium 9.8  8.4 - 10.5 mg/dL   Total Protein 8.3  6.0 - 8.3 g/dL   Albumin 4.2  3.5 - 5.2 g/dL   AST 17  0 - 37 U/L   ALT 9  0 - 35 U/L   Alkaline Phosphatase 67  39 - 117 U/L   Total Bilirubin 0.3  0.3 - 1.2 mg/dL   GFR calc non Af Amer >90  >90 mL/min   GFR calc Af Amer >90  >90 mL/min   Comment: (NOTE)     The eGFR has been calculated using the CKD EPI equation.     This calculation has not been validated in all clinical situations.     eGFR's persistently <90 mL/min signify possible Chronic Kidney     Disease.   Anion gap 15  5 - 15  ETHANOL     Status:  None   Collection Time    12/14/13  5:07 PM      Result Value Ref Range   Alcohol, Ethyl (B) <11  0 - 11 mg/dL   Comment:            LOWEST DETECTABLE LIMIT FOR     SERUM ALCOHOL  IS 11 mg/dL     FOR MEDICAL PURPOSES ONLY  SALICYLATE LEVEL     Status: Abnormal   Collection Time    12/14/13  5:07 PM      Result Value Ref Range   Salicylate Lvl <2.0 (*) 2.8 - 20.0 mg/dL  URINE RAPID DRUG SCREEN (HOSP PERFORMED)     Status: Abnormal   Collection Time    12/14/13  7:47 PM      Result Value Ref Range   Opiates NONE DETECTED  NONE DETECTED   Cocaine NONE DETECTED  NONE DETECTED   Benzodiazepines NONE DETECTED  NONE DETECTED   Amphetamines NONE DETECTED  NONE DETECTED   Tetrahydrocannabinol POSITIVE (*) NONE DETECTED   Barbiturates NONE DETECTED  NONE DETECTED   Comment:            DRUG SCREEN FOR MEDICAL PURPOSES     ONLY.  IF CONFIRMATION IS NEEDED     FOR ANY PURPOSE, NOTIFY LAB     WITHIN 5 DAYS.                LOWEST DETECTABLE LIMITS     FOR URINE DRUG SCREEN     Drug Class       Cutoff (ng/mL)     Amphetamine      1000     Barbiturate      200     Benzodiazepine   100     Tricyclics       712     Opiates          300     Cocaine          300     THC              50  PREGNANCY, URINE     Status: None   Collection Time    12/14/13  7:47 PM      Result Value Ref Range   Preg Test, Ur NEGATIVE  NEGATIVE   Comment:            THE SENSITIVITY OF THIS     METHODOLOGY IS >20 mIU/mL.   Labs are reviewed see above values.  Medication reviewed and no changes made  Current Facility-Administered Medications  Medication Dose Route Frequency Provider Last Rate Last Dose  . acetaminophen (TYLENOL) tablet 650 mg  650 mg Oral Q4H PRN Margarita Mail, PA-C      . albuterol (PROVENTIL HFA;VENTOLIN HFA) 108 (90 BASE) MCG/ACT inhaler 2 puff  2 puff Inhalation Q6H PRN Margarita Mail, PA-C      . alum & mag hydroxide-simeth (MAALOX/MYLANTA) 200-200-20 MG/5ML suspension 30 mL  30 mL Oral  PRN Margarita Mail, PA-C      . doxycycline (VIBRA-TABS) tablet 100 mg  100 mg Oral BID Orlie Dakin, MD   100 mg at 12/15/13 0936  . ibuprofen (ADVIL,MOTRIN) tablet 600 mg  600 mg Oral Q8H PRN Margarita Mail, PA-C      . Lurasidone HCl TABS 20 mg  20 mg Oral QHS Margarita Mail, PA-C   20 mg at 12/14/13 2156  . nicotine (NICODERM CQ - dosed in mg/24 hours) patch 21 mg  21 mg Transdermal Daily Margarita Mail, PA-C   21 mg at 12/15/13 0940  . OLANZapine zydis (ZYPREXA) disintegrating tablet 5 mg  5 mg Oral Q8H PRN Lurena Nida, NP   5 mg at 12/15/13 1027   And  . ziprasidone (GEODON) injection 20 mg  20 mg  Intramuscular PRN Lurena Nida, NP      . ondansetron [facility]) tablet 4 mg  4 mg Oral Q8H PRN Margarita Mail, PA-C       Current Outpatient Prescriptions  Medication Sig Dispense Refill  . albuterol (PROVENTIL HFA;VENTOLIN HFA) 108 (90 BASE) MCG/ACT inhaler Inhale 2 puffs into the lungs every 6 (six) hours as needed for wheezing or shortness of breath.      . Cyanocobalamin (B-12 PO) Take 2 tablets by mouth every other day. Takes 2 B-12 Vitamin Gummies      . Lurasidone HCl (LATUDA) 20 MG TABS Take 20 mg by mouth daily.         Psychiatric Specialty Exam:     Blood pressure 112/77, pulse 83, temperature 98.3 F (36.8 C), temperature source Oral, resp. rate 20, last menstrual period 11/03/2013, SpO2 100.00%.There is no weight on file to calculate BMI.  General Appearance: Casual and Fairly Groomed  Eye Contact::  Good  Speech:  Clear and Coherent and Normal Rate  Volume:  Normal  Mood:  "I just need help with my drug problem"  Affect:  Congruent  Thought Process:  Circumstantial and Goal Directed  Orientation:  Full (Time, Place, and Person)  Thought Content:  WDL  Suicidal Thoughts:  No  Homicidal Thoughts:  No  Memory:  Immediate;   Good Recent;   Good Remote;   Good  Judgement:  Fair  Insight:  Present  Psychomotor Activity:  Normal  Concentration:  Fair  Recall:  Good   Fund of Knowledge:Good  Language: Good  Akathisia:  No  Handed:  Right  AIMS (if indicated):     Assets:  Communication Skills Desire for Improvement Housing Social Support  Sleep:      Musculoskeletal: Strength & Muscle Tone: within normal limits Gait & Station: normal Patient leans: N/A  Treatment Plan Summary: Discharge home with resources for substance abuse and long term rehab  Earleen Newport, FNP-BC 12/15/2013 2:24 PM  I have personally seen the patient and agreed with the findings and involved in the treatment plan. Berniece Andreas, MD

## 2013-12-18 DIAGNOSIS — F309 Manic episode, unspecified: Secondary | ICD-10-CM | POA: Insufficient documentation

## 2013-12-18 HISTORY — DX: Manic episode, unspecified: F30.9

## 2013-12-20 ENCOUNTER — Emergency Department (HOSPITAL_COMMUNITY)
Admission: EM | Admit: 2013-12-20 | Discharge: 2013-12-21 | Disposition: A | Discharge status: Other Acute Inpatient Hospital | Payer: BC Managed Care – PPO | Attending: Emergency Medicine | Admitting: Emergency Medicine

## 2013-12-20 ENCOUNTER — Encounter (HOSPITAL_COMMUNITY): Payer: Self-pay | Admitting: Emergency Medicine

## 2013-12-20 DIAGNOSIS — H5316 Psychophysical visual disturbances: Secondary | ICD-10-CM | POA: Diagnosis not present

## 2013-12-20 DIAGNOSIS — R4182 Altered mental status, unspecified: Secondary | ICD-10-CM | POA: Diagnosis present

## 2013-12-20 DIAGNOSIS — F309 Manic episode, unspecified: Secondary | ICD-10-CM | POA: Diagnosis not present

## 2013-12-20 DIAGNOSIS — F1210 Cannabis abuse, uncomplicated: Secondary | ICD-10-CM | POA: Diagnosis not present

## 2013-12-20 DIAGNOSIS — F1410 Cocaine abuse, uncomplicated: Secondary | ICD-10-CM | POA: Diagnosis not present

## 2013-12-20 DIAGNOSIS — F3110 Bipolar disorder, current episode manic without psychotic features, unspecified: Secondary | ICD-10-CM

## 2013-12-20 DIAGNOSIS — Z3202 Encounter for pregnancy test, result negative: Secondary | ICD-10-CM | POA: Insufficient documentation

## 2013-12-20 DIAGNOSIS — Z880 Allergy status to penicillin: Secondary | ICD-10-CM | POA: Diagnosis not present

## 2013-12-20 DIAGNOSIS — F911 Conduct disorder, childhood-onset type: Secondary | ICD-10-CM | POA: Insufficient documentation

## 2013-12-20 DIAGNOSIS — Z79899 Other long term (current) drug therapy: Secondary | ICD-10-CM | POA: Diagnosis not present

## 2013-12-20 DIAGNOSIS — IMO0002 Reserved for concepts with insufficient information to code with codable children: Secondary | ICD-10-CM | POA: Insufficient documentation

## 2013-12-20 DIAGNOSIS — F17200 Nicotine dependence, unspecified, uncomplicated: Secondary | ICD-10-CM | POA: Diagnosis not present

## 2013-12-20 DIAGNOSIS — F1414 Cocaine abuse with cocaine-induced mood disorder: Secondary | ICD-10-CM | POA: Diagnosis present

## 2013-12-20 LAB — RAPID URINE DRUG SCREEN, HOSP PERFORMED
Amphetamines: NOT DETECTED
BARBITURATES: NOT DETECTED
Benzodiazepines: NOT DETECTED
COCAINE: POSITIVE — AB
Opiates: NOT DETECTED
TETRAHYDROCANNABINOL: POSITIVE — AB

## 2013-12-20 LAB — CBC WITH DIFFERENTIAL/PLATELET
BASOS ABS: 0 10*3/uL (ref 0.0–0.1)
Basophils Relative: 0 % (ref 0–1)
EOS PCT: 2 % (ref 0–5)
Eosinophils Absolute: 0.1 10*3/uL (ref 0.0–0.7)
HCT: 37.9 % (ref 36.0–46.0)
Hemoglobin: 13.3 g/dL (ref 12.0–15.0)
Lymphocytes Relative: 43 % (ref 12–46)
Lymphs Abs: 3.3 10*3/uL (ref 0.7–4.0)
MCH: 30.9 pg (ref 26.0–34.0)
MCHC: 35.1 g/dL (ref 30.0–36.0)
MCV: 87.9 fL (ref 78.0–100.0)
Monocytes Absolute: 0.7 10*3/uL (ref 0.1–1.0)
Monocytes Relative: 9 % (ref 3–12)
Neutro Abs: 3.6 10*3/uL (ref 1.7–7.7)
Neutrophils Relative %: 46 % (ref 43–77)
Platelets: 250 10*3/uL (ref 150–400)
RBC: 4.31 MIL/uL (ref 3.87–5.11)
RDW: 12.1 % (ref 11.5–15.5)
WBC: 7.7 10*3/uL (ref 4.0–10.5)

## 2013-12-20 LAB — SALICYLATE LEVEL: Salicylate Lvl: <2 mg/dL — ABNORMAL LOW (ref 2.8–20.0)

## 2013-12-20 LAB — URINALYSIS, ROUTINE W REFLEX MICROSCOPIC
BILIRUBIN URINE: NEGATIVE
Glucose, UA: NEGATIVE mg/dL
Hgb urine dipstick: NEGATIVE
KETONES UR: 15 mg/dL — AB
Leukocytes, UA: NEGATIVE
NITRITE: NEGATIVE
Protein, ur: NEGATIVE mg/dL
Specific Gravity, Urine: 1.009 (ref 1.005–1.030)
UROBILINOGEN UA: 1 mg/dL (ref 0.0–1.0)
pH: 5.5 (ref 5.0–8.0)

## 2013-12-20 LAB — BASIC METABOLIC PANEL
ANION GAP: 15 (ref 5–15)
BUN: 6 mg/dL (ref 6–23)
CALCIUM: 10.1 mg/dL (ref 8.4–10.5)
CO2: 20 mEq/L (ref 19–32)
CREATININE: 0.69 mg/dL (ref 0.50–1.10)
Chloride: 102 mEq/L (ref 96–112)
GFR calc Af Amer: >90 mL/min (ref >90)
GFR calc non Af Amer: >90 mL/min (ref >90)
GLUCOSE: 90 mg/dL (ref 70–99)
Potassium: 3.7 mEq/L (ref 3.7–5.3)
Sodium: 137 mEq/L (ref 137–147)

## 2013-12-20 LAB — PREGNANCY, URINE: Preg Test, Ur: NEGATIVE

## 2013-12-20 LAB — ETHANOL: Alcohol, Ethyl (B): <11 mg/dL (ref 0–11)

## 2013-12-20 LAB — ACETAMINOPHEN LEVEL: Acetaminophen (Tylenol), Serum: <15 ug/mL (ref 10–30)

## 2013-12-20 MED ORDER — LORAZEPAM 1 MG PO TABS
1.0000 mg | ORAL_TABLET | Freq: Three times a day (TID) | ORAL | Status: DC | PRN
  Administered 2013-12-20 – 2013-12-21 (×3): 1 mg via ORAL
  Filled 2013-12-20 (×3): qty 1

## 2013-12-20 MED ORDER — IBUPROFEN 200 MG PO TABS
600.0000 mg | ORAL_TABLET | Freq: Four times a day (QID) | ORAL | Status: DC | PRN
  Administered 2013-12-20: 600 mg via ORAL
  Filled 2013-12-20: qty 3

## 2013-12-20 MED ORDER — OLANZAPINE 5 MG PO TBDP
5.0000 mg | ORAL_TABLET | Freq: Three times a day (TID) | ORAL | Status: DC
  Administered 2013-12-20 – 2013-12-21 (×4): 5 mg via ORAL
  Filled 2013-12-20 (×4): qty 1

## 2013-12-20 NOTE — ED Notes (Signed)
Sitting quielty in activity rooom

## 2013-12-20 NOTE — ED Notes (Signed)
Up tot he bathroom to shower and change scrubs 

## 2013-12-20 NOTE — BH Assessment (Signed)
[facility]. Pt has been accepted to their wait list.  Elizabeth [name] Patsy BaltimoreWarrick Jr, [facility], [facility] Triage Specialist [phone]

## 2013-12-20 NOTE — ED Notes (Signed)
Dr Tenny Crawoss and Julieanne CottonJosephine into see

## 2013-12-20 NOTE — ED Notes (Signed)
sleeping

## 2013-12-20 NOTE — ED Notes (Signed)
Eating breakfast, pt is aware that she needsd to collect a urine sample, PO fluids encouraged

## 2013-12-20 NOTE — ED Notes (Signed)
Up to the bathroom, smillng, pleasent

## 2013-12-20 NOTE — ED Notes (Signed)
eating lunch, smiling, pleasent

## 2013-12-20 NOTE — ED Notes (Signed)
Up to the bathroom, pt trying to get a urine sample.  Pt had been yelling in her room for the nurse.  Pt instructed not to yell and responded "was I yelling.?"

## 2013-12-20 NOTE — ED Notes (Signed)
Pt walked back to her room, encouraged to verbalize fears/concerns. Still crying at times "I don;t understand what's wrong with me.[name].[name]."

## 2013-12-20 NOTE — ED Notes (Signed)
Sitting on the bed, calmer

## 2013-12-20 NOTE — ED Notes (Signed)
Patients dad into see

## 2013-12-20 NOTE — ED Provider Notes (Signed)
CSN: [id number]     Arrival date & time 12/20/13  0719 History   First MD Initiated Contact with Patient 12/20/13 [phone]     Chief Complaint  Patient presents with  . Altered Mental Status     (Consider location/radiation/quality/duration/timing/severity/associated sxs/prior Treatment) HPI Comments: 23 year-old female with history of polysubstance abuse, bipolar disorder, marijuana abuse, mood disorder presents with police after mother filter IVC paperwork for patient to 2 hallucinations, worsening behavior and aggression towards family. Per report recently patient has been more aggressive towards family members and is seeing "ghosts" and other hallucinations. Unknown if patient has been taking her medications. Patient knows the city that she's never does not a specific location or details of why she's here. 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 Difficulty obtaining details to 2 current mental state. Per records patient said similar in the past. Patient admits to drug use and says use this morning however unsure which drugs. Patient denies other symptoms such as fever, chills, headaches.  Patient is a 23 y.o. female presenting with altered mental status. The history is provided by the patient, a relative and the police.  Altered Mental Status Associated symptoms: hallucinations   Associated symptoms: no abdominal pain, no fever, no headaches, no light-headedness, no rash and no vomiting     Past Medical History  Diagnosis Date  . Depression   . Bipolar 1 disorder    Past Surgical History  Procedure Laterality Date  . Right foot surgery    . Abscess drainage      left groin  . Tonsillectomy and adenoidectomy    . Heel spur surgery     History reviewed. No pertinent family history. History  Substance Use Topics  . Smoking status: Current Every Day Smoker -- 0.25 packs/day  . Smokeless tobacco: Never Used  . Alcohol Use: 2.4  oz/week    4 Glasses of wine per week     Comment: occasional   OB History   Grav Para Term Preterm Abortions TAB SAB Ect Mult Living   1    1 1          Review of Systems  Constitutional: Negative for fever and chills.  HENT: Negative for congestion.   Eyes: Negative for visual disturbance.  Respiratory: Negative for shortness of breath.   Cardiovascular: Negative for chest pain.  Gastrointestinal: Negative for vomiting and abdominal pain.  Genitourinary: Negative for dysuria and flank pain.  Musculoskeletal: Negative for back pain, neck pain and neck stiffness.  Skin: Negative for rash.  Neurological: Negative for light-headedness and headaches.  Psychiatric/Behavioral: Positive for hallucinations. Negative for suicidal ideas.      Allergies  Depakote and Penicillins  Home Medications   Prior to Admission medications   Medication Sig Start Date End Date Taking? Authorizing Provider  albuterol (PROVENTIL HFA;VENTOLIN HFA) 108 (90 BASE) MCG/ACT inhaler Inhale 2 puffs into the lungs every 6 (six) hours as needed for wheezing or shortness of breath.    Historical Provider, MD  Cyanocobalamin (B-12 PO) Take 2 tablets by mouth every other day. Takes 2 B-12 Vitamin Gummies    Historical Provider, MD  Lurasidone HCl (LATUDA) 20 MG TABS Take 20 mg by mouth daily.     Historical Provider, MD   BP 124/77  Pulse 83  Temp(Src) 97.3 F (36.3 C) (Oral)  Resp 17  SpO2 99%  LMP 11/03/2013 Physical Exam  Nursing note and vitals reviewed. Constitutional: She  appears well-developed and well-nourished.  HENT:  Head: Normocephalic and atraumatic.  Eyes: Conjunctivae are normal. Right eye exhibits no discharge. Left eye exhibits no discharge.  Neck: Normal range of motion. Neck supple. No tracheal deviation present.  Cardiovascular: Normal rate and regular rhythm.   Pulmonary/Chest: Effort normal and breath sounds normal.  Abdominal: Soft. She exhibits no distension. There is no  tenderness. There is no guarding.  Musculoskeletal: She exhibits no edema.  Neurological: She is alert.  Skin: Skin is warm. No rash noted.  Psychiatric: Her speech is rapid and/or pressured (Mild). She is agitated and actively hallucinating. She expresses no homicidal and no suicidal ideation. She expresses no suicidal plans and no homicidal plans.  Patient has difficulty communicating as she frequently gets dazed and unable to follow conversation. She goes for mild agitation to crying and upset.    ED Course  Procedures (including critical care time) Labs Review Labs Reviewed  SALICYLATE LEVEL - Abnormal; Notable for the following:    Salicylate Lvl <2.0 (*)    All other components within normal limits  BASIC METABOLIC PANEL  CBC WITH DIFFERENTIAL  ACETAMINOPHEN LEVEL  ETHANOL  URINALYSIS, ROUTINE W REFLEX MICROSCOPIC  PREGNANCY, URINE  URINE RAPID DRUG SCREEN (HOSP PERFORMED)    Imaging Review No results found.   EKG Interpretation None      MDM   Final diagnoses:  None   Patient with history of similar presentation and bipolar history and her records. I attempted to call the mother however no response and left a message. IVC paperwork was filled out prior to arrival and please in ER with patient. After reviewing records this is consistent with her previous psychiatric admissions and I'm unsure if she has been taking her medications.  Spoke with the mother on the phone explains patient has been back abusing substances for the past week and his been in a manic state with aggressive behavior.  Plan for screening labs and psychiatric assessment.  Filed Vitals:   12/20/13 0725  BP: 124/77  Pulse: 83  Temp: 97.3 F (36.3 C)  TempSrc: Oral  Resp: 17  SpO2: 99%   Psychiatric assessment pending, likely plan for inpatient treatment.  Acute psychosis, aggression, polysubstance abuse, bipolar mood disorder  Enid Skeens, MD 12/21/13 1158

## 2013-12-20 NOTE — Progress Notes (Signed)
[facility] d/t SA and aggressive behavior by Dr. Earlene Plateravis.   Tomi BambergerMariya Carter Kassel Disposition MHT

## 2013-12-20 NOTE — ED Notes (Signed)
Pt up to the desk, crying, wanting to go home.

## 2013-12-20 NOTE — ED Notes (Signed)
Pt up to the desk crying reporting "I'm having an anxiety attack...why else would I be crying."  On arriving to  Room to medicate pt, pt was sitting in the floor and stated "I'm depressed".  Pt redirected back into room, medicated, snack given.   Tearful off/on questioning what was going on and how long she has to stay, support given,  Pt was able to calm down.

## 2013-12-20 NOTE — ED Notes (Signed)
Pt. Unable to urinate at this time. Will collect. Nurse was notified.

## 2013-12-20 NOTE — BH Assessment (Signed)
Followed up with [facility]. Per Darreld Mcleanrina, Pt has been declined due to aggression.  Harlin RainFord Ellis Ria CommentWarrick Jr, LPC, [facility] Triage Specialist [phone]

## 2013-12-20 NOTE — ED Notes (Signed)
MD at bedside. 

## 2013-12-20 NOTE — ED Notes (Signed)
Pt brought by [employer] for IVC from Mom.  Pt has hx of substance abuse.  Pt brought in from home.  Per IVC, pt is hearing voices, seeing Ghosts and having increased aggressive behavior.

## 2013-12-20 NOTE — Progress Notes (Signed)
The following facilities have been contacted regarding bed availability:  REFERRAL FAXED [facility]- per Britta MccreedyBarbara can fax [facility]- [facility] [place] [facility] but expected d/c's 7.19 and can fax for wait list [facility] Pl[facility]- per [facility]- per Mariam at capacity but can fax for in case of unexpected d/c's  [facility]- per Alfonso RamusMargaret [facility]- per Shelton SilvasKatelyn Gaston- per Lenward Chancellorlivia [facility]- [facility]- per Rhae Hammockasha [facility]- [facility]- [facility]  [facility]   Unika Nazareno Disposition MHT

## 2013-12-20 NOTE — ED Notes (Signed)
Patients mom into see 

## 2013-12-20 NOTE — ED Notes (Addendum)
Patients mother Karle Starch(Myra Macklin [phone]) called and reports that she has never seen the patient behave like she has been recently. Mom  reports that she is fine on minute and angry, talking crazy and aggressive the next.  Mom reports that when the patient was dc'd her last visit she was dressed in scrubs w/ no shoes and went to an attorney's office asking about starting a business.  Mom reports that they did not know where the patient was after her dc and that she (the pt) had rented a car and driven to [place] .  Mom reported that a friend of the patient called her and  told her that he was concerned about her behavior and some one needed to come get her.  Mom reports that she has not been eating or sleeping  and also stated that the pt told her she has been hearing voices,and that she woke up from a nightmare and thought she had killed her boyfriend.  Mom reports that the patient returned this morning.  Mom also reported that the patient called her this morning and was cursing at her and asking about a broken arm that she had as a child-mom reports that the patient has never had a broken arm.  Have informed  DR/NP.

## 2013-12-20 NOTE — Consult Note (Signed)
[facility] Consult   Reason for Consult:  Manic behavior Referring Physician:  EDP Elizabeth [name] is an 23 y.o. female. Total Time spent with patient: 30 minutes  Assessment: AXIS I:  Bipolar, Manic AXIS II:  Deferred AXIS III:   Past Medical History  Diagnosis Date  . Depression   . Bipolar 1 disorder    AXIS IV:  other psychosocial or environmental problems AXIS V:  21-30 behavior considerably influenced by delusions or hallucinations OR serious impairment in judgment, communication OR inability to function in almost all areas  Plan:  Recommend psychiatric Inpatient admission when medically cleared.  Subjective:   Elizabeth [name] is a 23 y.o. female patient admitted with psychotic and erratic behavior/  HPI: Patient is a 23 year old black female who lives with her mother and stepfather she is currently unemployed. The patient was brought in IVC found by her mother. The patient had just been in the emergency room approximately 4 days ago asking for help for substance abuse. She was discharged with resources.  Apparently since then she has been acting very erratically. She went to a lawyer's office stating she wanted to start a business. She then went to a car rental and rented a car drove to [place] and drove back. She's not been sleeping. She claims to be hearing voices and seeing ghosts. Her thoughts are extremely disorganized and she describes her mood as up and down. She admits that she used cocaine and marijuana her drug screen is positive for cocaine and barbiturates. It's difficult for her to sit still and she can get quite agitated. She denies suicidal or homicidal ideation HPI Elements:   Location:  generalized. Quality:  severe. Severity:  severe. Timing:  several days. Duration:  several days. Context:  substance use.  Past Psychiatric History: Past Medical History  Diagnosis Date  . Depression   . Bipolar 1 disorder     reports that she has  been smoking.  She has never used smokeless tobacco. She reports that she drinks about 2.4 ounces of alcohol per week. She reports that she uses illicit drugs (Benzodiazepines, Oxycodone, Cocaine, and Marijuana). History reviewed. No pertinent family history.         Allergies:   Allergies  Allergen Reactions  . Depakote [Divalproex Sodium] Anaphylaxis and Other (See Comments)    "Almost died"  . Penicillins Anaphylaxis     Objective: Blood pressure 122/76, pulse 69, temperature 98.5 F (36.9 C), temperature source Oral, resp. rate 20, last menstrual period 11/03/2013, SpO2 100.00%.There is no weight on file to calculate BMI. Results for orders placed during the hospital encounter of 12/20/13 (from the past 72 hour(s))  BASIC METABOLIC PANEL     Status: None   Collection Time    12/20/13  7:54 AM      Result Value Ref Range   Sodium 137  137 - 147 mEq/L   Potassium 3.7  3.7 - 5.3 mEq/L   Chloride 102  96 - 112 mEq/L   CO2 20  19 - 32 mEq/L   Glucose, Bld 90  70 - 99 mg/dL   BUN 6  6 - 23 mg/dL   Creatinine, Ser 0.69  0.50 - 1.10 mg/dL   Calcium 10.1  8.4 - 10.5 mg/dL   GFR calc non Af Amer >90  >90 mL/min   GFR calc Af Amer >90  >90 mL/min   Comment: (NOTE)     The eGFR has been calculated using the CKD EPI  equation.     This calculation has not been validated in all clinical situations.     eGFR's persistently <90 mL/min signify possible Chronic Kidney     Disease.   Anion gap 15  5 - 15  CBC WITH DIFFERENTIAL     Status: None   Collection Time    12/20/13  7:54 AM      Result Value Ref Range   WBC 7.7  4.0 - 10.5 K/uL   RBC 4.31  3.87 - 5.11 MIL/uL   Hemoglobin 13.3  12.0 - 15.0 g/dL   HCT 37.9  36.0 - 46.0 %   MCV 87.9  78.0 - 100.0 fL   MCH 30.9  26.0 - 34.0 pg   MCHC 35.1  30.0 - 36.0 g/dL   RDW 12.1  11.5 - 15.5 %   Platelets 250  150 - 400 K/uL   Neutrophils Relative % 46  43 - 77 %   Neutro Abs 3.6  1.7 - 7.7 K/uL   Lymphocytes Relative 43  12 - 46 %    Lymphs Abs 3.3  0.7 - 4.0 K/uL   Monocytes Relative 9  3 - 12 %   Monocytes Absolute 0.7  0.1 - 1.0 K/uL   Eosinophils Relative 2  0 - 5 %   Eosinophils Absolute 0.1  0.0 - 0.7 K/uL   Basophils Relative 0  0 - 1 %   Basophils Absolute 0.0  0.0 - 0.1 K/uL  ACETAMINOPHEN LEVEL     Status: None   Collection Time    12/20/13  7:54 AM      Result Value Ref Range   Acetaminophen (Tylenol), Serum <15.0  10 - 30 ug/mL   Comment:            THERAPEUTIC CONCENTRATIONS VARY     SIGNIFICANTLY. A RANGE OF 10-30     ug/mL MAY BE AN EFFECTIVE     CONCENTRATION FOR MANY PATIENTS.     HOWEVER, SOME ARE BEST TREATED     AT CONCENTRATIONS OUTSIDE THIS     RANGE.     ACETAMINOPHEN CONCENTRATIONS     >150 ug/mL AT 4 HOURS AFTER     INGESTION AND >50 ug/mL AT 12     HOURS AFTER INGESTION ARE     OFTEN ASSOCIATED WITH TOXIC     REACTIONS.  ETHANOL     Status: None   Collection Time    12/20/13  7:54 AM      Result Value Ref Range   Alcohol, Ethyl (B) <11  0 - 11 mg/dL   Comment:            LOWEST DETECTABLE LIMIT FOR     SERUM ALCOHOL IS 11 mg/dL     FOR MEDICAL PURPOSES ONLY  SALICYLATE LEVEL     Status: Abnormal   Collection Time    12/20/13  7:54 AM      Result Value Ref Range   Salicylate Lvl <2.7 (*) 2.8 - 20.0 mg/dL  URINALYSIS, ROUTINE W REFLEX MICROSCOPIC     Status: Abnormal   Collection Time    12/20/13  9:26 AM      Result Value Ref Range   Color, Urine YELLOW  YELLOW   APPearance CLOUDY (*) CLEAR   Specific Gravity, Urine 1.009  1.005 - 1.030   pH 5.5  5.0 - 8.0   Glucose, UA NEGATIVE  NEGATIVE mg/dL   Hgb urine dipstick NEGATIVE  NEGATIVE  Bilirubin Urine NEGATIVE  NEGATIVE   Ketones, ur 15 (*) NEGATIVE mg/dL   Protein, ur NEGATIVE  NEGATIVE mg/dL   Urobilinogen, UA 1.0  0.0 - 1.0 mg/dL   Nitrite NEGATIVE  NEGATIVE   Leukocytes, UA NEGATIVE  NEGATIVE   Comment: MICROSCOPIC NOT DONE ON URINES WITH NEGATIVE PROTEIN, BLOOD, LEUKOCYTES, NITRITE, OR GLUCOSE <1000 mg/dL.   PREGNANCY, URINE     Status: None   Collection Time    12/20/13  9:26 AM      Result Value Ref Range   Preg Test, Ur NEGATIVE  NEGATIVE   Comment:            THE SENSITIVITY OF THIS     METHODOLOGY IS >20 mIU/mL.  URINE RAPID DRUG SCREEN (HOSP PERFORMED)     Status: Abnormal   Collection Time    12/20/13  9:26 AM      Result Value Ref Range   Opiates NONE DETECTED  NONE DETECTED   Cocaine POSITIVE (*) NONE DETECTED   Benzodiazepines NONE DETECTED  NONE DETECTED   Amphetamines NONE DETECTED  NONE DETECTED   Tetrahydrocannabinol POSITIVE (*) NONE DETECTED   Barbiturates NONE DETECTED  NONE DETECTED   Comment:            DRUG SCREEN FOR MEDICAL PURPOSES     ONLY.  IF CONFIRMATION IS NEEDED     FOR ANY PURPOSE, NOTIFY LAB     WITHIN 5 DAYS.                LOWEST DETECTABLE LIMITS     FOR URINE DRUG SCREEN     Drug Class       Cutoff (ng/mL)     Amphetamine      1000     Barbiturate      200     Benzodiazepine   026     Tricyclics       378     Opiates          300     Cocaine          300     THC              50   Labs are reviewed and are pertinent for   Current Facility-Administered Medications  Medication Dose Route Frequency Provider Last Rate Last Dose  . ibuprofen (ADVIL,MOTRIN) tablet 600 mg  600 mg Oral Q6H PRN Dot Lanes, MD   600 mg at 12/20/13 1000  . LORazepam (ATIVAN) tablet 1 mg  1 mg Oral Q8H PRN Mariea Clonts, MD   1 mg at 12/20/13 1251   Current Outpatient Prescriptions  Medication Sig Dispense Refill  . albuterol (PROVENTIL HFA;VENTOLIN HFA) 108 (90 BASE) MCG/ACT inhaler Inhale 2 puffs into the lungs every 6 (six) hours as needed for wheezing or shortness of breath.      . Lurasidone HCl (LATUDA) 20 MG TABS Take 20 mg by mouth daily.      . vitamin B-12 (CYANOCOBALAMIN) 1000 MCG tablet Take 2,000 mcg by mouth daily.        Psychiatric Specialty Exam: Physical Exam  Constitutional: She appears well-developed and well-nourished.  HENT:  Head:  Normocephalic.  Neck: Normal range of motion.  Cardiovascular: Normal rate.   Respiratory: Effort normal.  Musculoskeletal: Normal range of motion.  Neurological: She is alert.  Skin: Skin is warm and dry.    Review of Systems  Constitutional: Negative.   HENT: Negative.  Eyes: Negative.   Cardiovascular: Negative.   Gastrointestinal: Negative.   Genitourinary: Negative.   Musculoskeletal: Negative.   Skin: Negative.   Neurological: Negative.   Endo/Heme/Allergies: Negative.   Psychiatric/Behavioral: Positive for substance abuse. The patient is nervous/anxious.     Blood pressure 122/76, pulse 69, temperature 98.5 F (36.9 C), temperature source Oral, resp. rate 20, last menstrual period 11/03/2013, SpO2 100.00%.There is no weight on file to calculate BMI.  General Appearance: Disheveled  Eye Contact::  Poor  Speech:  Pressured  Volume:  Increased  Mood:  Angry, Anxious and Irritable  Affect:  Inappropriate and Labile  Thought Process:  Circumstantial, Disorganized and Loose  Orientation:  Full (Time, Place, and Person)  Thought Content:  Hallucinations: Auditory  Suicidal Thoughts:  No  Homicidal Thoughts:  No  Memory:  Immediate;   Poor Recent;   Poor Remote;   Poor  Judgement:  Impaired  Insight:  Lacking  Psychomotor Activity:  Increased  Concentration:  Poor  Recall:  Poor  Fund of Knowledge:Fair  Language: Fair  Akathisia:  No  Handed:  Left  AIMS (if indicated):     Assets:  Housing Social Support  Sleep:      Musculoskeletal: Strength & Muscle Tone: within normal limits Gait & Station: normal Patient leans: N/A  Treatment Plan Summary: Daily contact with patient to assess and evaluate symptoms and progress in treatment Medication management Will start Zyprexa. Patient needs inpatient admission Aeneas Longsworth, [facility] 12/20/2013 1:56 PM

## 2013-12-21 ENCOUNTER — Inpatient Hospital Stay (HOSPITAL_COMMUNITY)
Admission: AD | Admit: 2013-12-21 | Discharge: 2013-12-29 | DRG: 885 | Disposition: A | Discharge status: Rehabilitation Facility | Payer: 59 | Source: Intra-hospital | Attending: Psychiatry | Admitting: Psychiatry

## 2013-12-21 ENCOUNTER — Encounter (HOSPITAL_COMMUNITY): Payer: Self-pay | Admitting: *Deleted

## 2013-12-21 ENCOUNTER — Encounter (HOSPITAL_COMMUNITY): Payer: Self-pay | Admitting: Psychiatry

## 2013-12-21 DIAGNOSIS — F411 Generalized anxiety disorder: Secondary | ICD-10-CM | POA: Diagnosis present

## 2013-12-21 DIAGNOSIS — F329 Major depressive disorder, single episode, unspecified: Secondary | ICD-10-CM | POA: Diagnosis present

## 2013-12-21 DIAGNOSIS — Z9119 Patient's noncompliance with other medical treatment and regimen: Secondary | ICD-10-CM

## 2013-12-21 DIAGNOSIS — F1410 Cocaine abuse, uncomplicated: Secondary | ICD-10-CM | POA: Diagnosis present

## 2013-12-21 DIAGNOSIS — F1994 Other psychoactive substance use, unspecified with psychoactive substance-induced mood disorder: Secondary | ICD-10-CM | POA: Diagnosis present

## 2013-12-21 DIAGNOSIS — F17200 Nicotine dependence, unspecified, uncomplicated: Secondary | ICD-10-CM | POA: Diagnosis present

## 2013-12-21 DIAGNOSIS — G4700 Insomnia, unspecified: Secondary | ICD-10-CM | POA: Diagnosis present

## 2013-12-21 DIAGNOSIS — F1414 Cocaine abuse with cocaine-induced mood disorder: Secondary | ICD-10-CM | POA: Diagnosis present

## 2013-12-21 DIAGNOSIS — F1210 Cannabis abuse, uncomplicated: Secondary | ICD-10-CM | POA: Diagnosis present

## 2013-12-21 DIAGNOSIS — F3110 Bipolar disorder, current episode manic without psychotic features, unspecified: Secondary | ICD-10-CM | POA: Diagnosis present

## 2013-12-21 DIAGNOSIS — R45850 Homicidal ideations: Secondary | ICD-10-CM | POA: Diagnosis not present

## 2013-12-21 DIAGNOSIS — Z91199 Patient's noncompliance with other medical treatment and regimen due to unspecified reason: Secondary | ICD-10-CM | POA: Diagnosis not present

## 2013-12-21 DIAGNOSIS — F1220 Cannabis dependence, uncomplicated: Secondary | ICD-10-CM | POA: Diagnosis present

## 2013-12-21 DIAGNOSIS — F309 Manic episode, unspecified: Secondary | ICD-10-CM | POA: Diagnosis not present

## 2013-12-21 MED ORDER — WHITE PETROLATUM GEL
Status: DC | PRN
  Administered 2013-12-21: 10:00:00 via TOPICAL
  Filled 2013-12-21 (×2): qty 5

## 2013-12-21 MED ORDER — NICOTINE POLACRILEX 2 MG MT GUM
2.0000 mg | CHEWING_GUM | OROMUCOSAL | Status: DC | PRN
  Administered 2013-12-21: 2 mg via ORAL
  Filled 2013-12-21: qty 1

## 2013-12-21 MED ORDER — NICOTINE 21 MG/24HR TD PT24
21.0000 mg | MEDICATED_PATCH | Freq: Every day | TRANSDERMAL | Status: DC
  Administered 2013-12-21 – 2013-12-29 (×6): 21 mg via TRANSDERMAL
  Filled 2013-12-21 (×10): qty 1

## 2013-12-21 MED ORDER — BACITRACIN-NEOMYCIN-POLYMYXIN OINTMENT TUBE
TOPICAL_OINTMENT | Freq: Two times a day (BID) | CUTANEOUS | Status: DC
  Administered 2013-12-21: 10:00:00 via TOPICAL
  Filled 2013-12-21: qty 15

## 2013-12-21 NOTE — ED Notes (Signed)
Up to the bathroom 

## 2013-12-21 NOTE — ED Notes (Signed)
Up to the desk on the phone 

## 2013-12-21 NOTE — BH Assessment (Signed)
Per Binnie RailJoann [name], AC at [facility], [address] is currently at capacity. Contacted the following facilities for placement:   ACCEPTED TO WAIT LIST: [facility]  INFORMATION HAS BEEN FAXED, AWAITING RESPONSE:  [facility]  [facility]  [facility]   AT CAPACITY:  [facility], [facility]  [facility], per [facility]  [facility], per [facility]  [facility], per [facility]  [facility], per [facility]  [facility], per [facility]  [facility], per [employer]  [facility], per [facility] Nixonebra  [facility], per [facility]  [facility], per [place]  [facility], Per [facility]  [facility], per [place]   PT DECLINED:  [facility] [employer] [facility]   [address] Patsy BaltimoreWarrick Jr, [facility], [facility] Triage Specialist [phone]

## 2013-12-21 NOTE — ED Notes (Signed)
Dr ross and jamison into see 

## 2013-12-21 NOTE — ED Notes (Signed)
In activity room talking w/ other patient

## 2013-12-21 NOTE — ED Notes (Signed)
Talking w/ mom in activity room

## 2013-12-21 NOTE — ED Notes (Signed)
In activity room talking/drawing

## 2013-12-21 NOTE — Progress Notes (Signed)
[facility] Gunn[facility]Paula [facility] ben added to their wait list   Tomi BambergerMariya Eli Adami Disposition MHT

## 2013-12-21 NOTE — ED Notes (Signed)
Pt's dad into see, pt sitting quielty drawing. Pt and father are aware [facility] and will [facility] tonight.

## 2013-12-21 NOTE — ED Notes (Signed)
Pt's mother into see 

## 2013-12-21 NOTE — ED Notes (Signed)
More relaxed, smiling, up to the desk to get magazines

## 2013-12-21 NOTE — ED Notes (Signed)
Up to the desk requesting something to help her relax

## 2013-12-21 NOTE — Consult Note (Signed)
[facility] Consult   Reason for Consult:  Manic behavior Referring Physician:  EDP Elizabeth [name] is an 23 y.o. female. Total Time spent with patient: 30 minutes  Assessment: AXIS I:  Bipolar, Manic AXIS II:  Deferred AXIS III:   Past Medical History  Diagnosis Date  . Depression   . Bipolar 1 disorder    AXIS IV:  other psychosocial or environmental problems AXIS V:  21-30 behavior considerably influenced by delusions or hallucinations OR serious impairment in judgment, communication OR inability to function in almost all areas  Plan:  Recommend psychiatric Inpatient admission when medically cleared.  Dr. Harrington Challenger assessed the patient and concurs with the plan.  Subjective:   Elizabeth [name] is a 23 y.o. female patient admitted with psychotic and erratic behavior/  HPI: Patient has improved, less manic with some sleep but still remains irritable with pressured speech and mania.  Yelling at people on the phone, denies hallucinations.  Uses cocaine about every 2 months, marijuana use more frequently.    Past Psychiatric History: Past Medical History  Diagnosis Date  . Depression   . Bipolar 1 disorder     reports that she has been smoking.  She has never used smokeless tobacco. She reports that she drinks about 2.4 ounces of alcohol per week. She reports that she uses illicit drugs (Benzodiazepines, Oxycodone, Cocaine, and Marijuana). History reviewed. No pertinent family history.         Allergies:   Allergies  Allergen Reactions  . Depakote [Divalproex Sodium] Anaphylaxis and Other (See Comments)    "Almost died"  . Penicillins Anaphylaxis     Objective: Blood pressure 104/98, pulse 69, temperature 97.8 F (36.6 C), temperature source Oral, resp. rate 18, last menstrual period 11/03/2013, SpO2 100.00%.There is no weight on file to calculate BMI. Results for orders placed during the hospital encounter of 12/20/13 (from the past 72 hour(s))  BASIC  METABOLIC PANEL     Status: None   Collection Time    12/20/13  7:54 AM      Result Value Ref Range   Sodium 137  137 - 147 mEq/L   Potassium 3.7  3.7 - 5.3 mEq/L   Chloride 102  96 - 112 mEq/L   CO2 20  19 - 32 mEq/L   Glucose, Bld 90  70 - 99 mg/dL   BUN 6  6 - 23 mg/dL   Creatinine, Ser 0.69  0.50 - 1.10 mg/dL   Calcium 10.1  8.4 - 10.5 mg/dL   GFR calc non Af Amer >90  >90 mL/min   GFR calc Af Amer >90  >90 mL/min   Comment: (NOTE)     The eGFR has been calculated using the CKD EPI equation.     This calculation has not been validated in all clinical situations.     eGFR's persistently <90 mL/min signify possible Chronic Kidney     Disease.   Anion gap 15  5 - 15  CBC WITH DIFFERENTIAL     Status: None   Collection Time    12/20/13  7:54 AM      Result Value Ref Range   WBC 7.7  4.0 - 10.5 K/uL   RBC 4.31  3.87 - 5.11 MIL/uL   Hemoglobin 13.3  12.0 - 15.0 g/dL   HCT 37.9  36.0 - 46.0 %   MCV 87.9  78.0 - 100.0 fL   MCH 30.9  26.0 - 34.0 pg   MCHC 35.1  30.0 - 36.0 g/dL   RDW 12.1  11.5 - 15.5 %   Platelets 250  150 - 400 K/uL   Neutrophils Relative % 46  43 - 77 %   Neutro Abs 3.6  1.7 - 7.7 K/uL   Lymphocytes Relative 43  12 - 46 %   Lymphs Abs 3.3  0.7 - 4.0 K/uL   Monocytes Relative 9  3 - 12 %   Monocytes Absolute 0.7  0.1 - 1.0 K/uL   Eosinophils Relative 2  0 - 5 %   Eosinophils Absolute 0.1  0.0 - 0.7 K/uL   Basophils Relative 0  0 - 1 %   Basophils Absolute 0.0  0.0 - 0.1 K/uL  ACETAMINOPHEN LEVEL     Status: None   Collection Time    12/20/13  7:54 AM      Result Value Ref Range   Acetaminophen (Tylenol), Serum <15.0  10 - 30 ug/mL   Comment:            THERAPEUTIC CONCENTRATIONS VARY     SIGNIFICANTLY. A RANGE OF 10-30     ug/mL MAY BE AN EFFECTIVE     CONCENTRATION FOR MANY PATIENTS.     HOWEVER, SOME ARE BEST TREATED     AT CONCENTRATIONS OUTSIDE THIS     RANGE.     ACETAMINOPHEN CONCENTRATIONS     >150 ug/mL AT 4 HOURS AFTER     INGESTION AND  >50 ug/mL AT 12     HOURS AFTER INGESTION ARE     OFTEN ASSOCIATED WITH TOXIC     REACTIONS.  ETHANOL     Status: None   Collection Time    12/20/13  7:54 AM      Result Value Ref Range   Alcohol, Ethyl (B) <11  0 - 11 mg/dL   Comment:            LOWEST DETECTABLE LIMIT FOR     SERUM ALCOHOL IS 11 mg/dL     FOR MEDICAL PURPOSES ONLY  SALICYLATE LEVEL     Status: Abnormal   Collection Time    12/20/13  7:54 AM      Result Value Ref Range   Salicylate Lvl <6.0 (*) 2.8 - 20.0 mg/dL  URINALYSIS, ROUTINE W REFLEX MICROSCOPIC     Status: Abnormal   Collection Time    12/20/13  9:26 AM      Result Value Ref Range   Color, Urine YELLOW  YELLOW   APPearance CLOUDY (*) CLEAR   Specific Gravity, Urine 1.009  1.005 - 1.030   pH 5.5  5.0 - 8.0   Glucose, UA NEGATIVE  NEGATIVE mg/dL   Hgb urine dipstick NEGATIVE  NEGATIVE   Bilirubin Urine NEGATIVE  NEGATIVE   Ketones, ur 15 (*) NEGATIVE mg/dL   Protein, ur NEGATIVE  NEGATIVE mg/dL   Urobilinogen, UA 1.0  0.0 - 1.0 mg/dL   Nitrite NEGATIVE  NEGATIVE   Leukocytes, UA NEGATIVE  NEGATIVE   Comment: MICROSCOPIC NOT DONE ON URINES WITH NEGATIVE PROTEIN, BLOOD, LEUKOCYTES, NITRITE, OR GLUCOSE <1000 mg/dL.  PREGNANCY, URINE     Status: None   Collection Time    12/20/13  9:26 AM      Result Value Ref Range   Preg Test, Ur NEGATIVE  NEGATIVE   Comment:            THE SENSITIVITY OF THIS     METHODOLOGY IS >20 mIU/mL.  URINE RAPID DRUG SCREEN (  HOSP PERFORMED)     Status: Abnormal   Collection Time    12/20/13  9:26 AM      Result Value Ref Range   Opiates NONE DETECTED  NONE DETECTED   Cocaine POSITIVE (*) NONE DETECTED   Benzodiazepines NONE DETECTED  NONE DETECTED   Amphetamines NONE DETECTED  NONE DETECTED   Tetrahydrocannabinol POSITIVE (*) NONE DETECTED   Barbiturates NONE DETECTED  NONE DETECTED   Comment:            DRUG SCREEN FOR MEDICAL PURPOSES     ONLY.  IF CONFIRMATION IS NEEDED     FOR ANY PURPOSE, NOTIFY LAB      WITHIN 5 DAYS.                LOWEST DETECTABLE LIMITS     FOR URINE DRUG SCREEN     Drug Class       Cutoff (ng/mL)     Amphetamine      1000     Barbiturate      200     Benzodiazepine   153     Tricyclics       794     Opiates          300     Cocaine          300     THC              50   Labs are reviewed and are pertinent for   Current Facility-Administered Medications  Medication Dose Route Frequency Provider Last Rate Last Dose  . ibuprofen (ADVIL,MOTRIN) tablet 600 mg  600 mg Oral Q6H PRN Dot Lanes, MD   600 mg at 12/20/13 1000  . LORazepam (ATIVAN) tablet 1 mg  1 mg Oral Q8H PRN Mariea Clonts, MD   1 mg at 12/20/13 2112  . neomycin-bacitracin-polymyxin (NEOSPORIN) ointment   Topical BID Leota Jacobsen, MD      . nicotine polacrilex (NICORETTE) gum 2 mg  2 mg Oral PRN Waylan Boga, NP   2 mg at 12/21/13 1111  . OLANZapine zydis (ZYPREXA) disintegrating tablet 5 mg  5 mg Oral TID Levonne Spiller, MD   5 mg at 12/21/13 1006  . white petrolatum (VASELINE) gel   Topical PRN Leota Jacobsen, MD       Current Outpatient Prescriptions  Medication Sig Dispense Refill  . albuterol (PROVENTIL HFA;VENTOLIN HFA) 108 (90 BASE) MCG/ACT inhaler Inhale 2 puffs into the lungs every 6 (six) hours as needed for wheezing or shortness of breath.      . Lurasidone HCl (LATUDA) 20 MG TABS Take 20 mg by mouth daily.      . vitamin B-12 (CYANOCOBALAMIN) 1000 MCG tablet Take 2,000 mcg by mouth daily.        Psychiatric Specialty Exam: Physical Exam  Constitutional: She appears well-developed and well-nourished.  HENT:  Head: Normocephalic.  Neck: Normal range of motion.  Cardiovascular: Normal rate.   Respiratory: Effort normal.  Musculoskeletal: Normal range of motion.  Neurological: She is alert.  Skin: Skin is warm and dry.    Review of Systems  Constitutional: Negative.   HENT: Negative.   Eyes: Negative.   Cardiovascular: Negative.   Gastrointestinal: Negative.    Genitourinary: Negative.   Musculoskeletal: Negative.   Skin: Negative.   Neurological: Negative.   Endo/Heme/Allergies: Negative.   Psychiatric/Behavioral: Positive for substance abuse. The patient is nervous/anxious.     Blood pressure  104/98, pulse 69, temperature 97.8 F (36.6 C), temperature source Oral, resp. rate 18, last menstrual period 11/03/2013, SpO2 100.00%.There is no weight on file to calculate BMI.  General Appearance: Disheveled  Eye Contact::  Poor  Speech:  Pressured  Volume:  Increased  Mood:  Angry, Anxious and Irritable  Affect:  Inappropriate and Labile  Thought Process:  Circumstantial, Disorganized and Loose  Orientation:  Full (Time, Place, and Person)  Thought Content:  Hallucinations: Auditory  Suicidal Thoughts:  No  Homicidal Thoughts:  No  Memory:  Immediate;   Poor Recent;   Poor Remote;   Poor  Judgement:  Impaired  Insight:  Lacking  Psychomotor Activity:  Increased  Concentration:  Poor  Recall:  Poor  Fund of Knowledge:Fair  Language: Fair  Akathisia:  No  Handed:  Left  AIMS (if indicated):     Assets:  Housing Social Support  Sleep:      Musculoskeletal: Strength & Muscle Tone: within normal limits Gait & Station: normal Patient leans: N/A  Treatment Plan Summary: Daily contact with patient to assess and evaluate symptoms and progress in treatment Medication management; admit to inpatient for stabilization  Waylan Boga, PMH-NP 12/21/2013 11:43 AM Patient seen and I agree with assessment and plan Levonne Spiller MD

## 2013-12-21 NOTE — ED Provider Notes (Signed)
Medical screening examination/treatment/procedure(s) were conducted as a shared visit with non-physician practitioner(s) and myself.  I personally evaluated the patient during the encounter.   EKG Interpretation None       Doug SouSam Takita Riecke, MD 12/21/13 2043

## 2013-12-22 ENCOUNTER — Encounter (HOSPITAL_COMMUNITY): Payer: Self-pay | Admitting: Psychiatry

## 2013-12-22 DIAGNOSIS — F1410 Cocaine abuse, uncomplicated: Secondary | ICD-10-CM

## 2013-12-22 DIAGNOSIS — F3110 Bipolar disorder, current episode manic without psychotic features, unspecified: Principal | ICD-10-CM

## 2013-12-22 DIAGNOSIS — F1210 Cannabis abuse, uncomplicated: Secondary | ICD-10-CM

## 2013-12-22 MED ORDER — HYDROXYZINE HCL 50 MG PO TABS
50.0000 mg | ORAL_TABLET | Freq: Once | ORAL | Status: DC
  Filled 2013-12-22 (×2): qty 1

## 2013-12-22 MED ORDER — ENSURE COMPLETE PO LIQD
237.0000 mL | Freq: Two times a day (BID) | ORAL | Status: DC
  Administered 2013-12-22 – 2013-12-29 (×13): 237 mL via ORAL

## 2013-12-22 MED ORDER — CARBAMAZEPINE ER 200 MG PO TB12
200.0000 mg | ORAL_TABLET | Freq: Two times a day (BID) | ORAL | Status: DC
  Administered 2013-12-22 – 2013-12-29 (×15): 200 mg via ORAL
  Filled 2013-12-22 (×18): qty 1

## 2013-12-22 MED ORDER — OLANZAPINE 10 MG PO TBDP
10.0000 mg | ORAL_TABLET | Freq: Three times a day (TID) | ORAL | Status: DC | PRN
  Administered 2013-12-23 – 2013-12-28 (×7): 10 mg via ORAL
  Filled 2013-12-22 (×9): qty 1

## 2013-12-22 MED ORDER — LURASIDONE HCL 40 MG PO TABS
40.0000 mg | ORAL_TABLET | Freq: Every day | ORAL | Status: DC
  Administered 2013-12-22 – 2013-12-23 (×2): 40 mg via ORAL
  Filled 2013-12-22 (×3): qty 1

## 2013-12-22 MED ORDER — ACETAMINOPHEN 325 MG PO TABS
650.0000 mg | ORAL_TABLET | Freq: Four times a day (QID) | ORAL | Status: DC | PRN
  Administered 2013-12-23 – 2013-12-26 (×3): 650 mg via ORAL
  Filled 2013-12-22 (×3): qty 2

## 2013-12-22 MED ORDER — MAGNESIUM HYDROXIDE 400 MG/5ML PO SUSP: 30.0000 mL | Freq: Every day | ORAL | Status: DC | PRN

## 2013-12-22 MED ORDER — HYDROXYZINE HCL 25 MG PO TABS
25.0000 mg | ORAL_TABLET | Freq: Three times a day (TID) | ORAL | Status: DC | PRN
  Administered 2013-12-22 – 2013-12-29 (×10): 25 mg via ORAL
  Filled 2013-12-22 (×10): qty 1

## 2013-12-22 MED ORDER — TRAZODONE HCL 100 MG PO TABS
100.0000 mg | ORAL_TABLET | Freq: Every day | ORAL | Status: DC
  Filled 2013-12-22 (×3): qty 1

## 2013-12-22 MED ORDER — ALUM & MAG HYDROXIDE-SIMETH 200-200-20 MG/5ML PO SUSP
30.0000 mL | ORAL | Status: DC | PRN
Start: 2013-12-22 — End: 2013-12-29

## 2013-12-22 NOTE — Tx Team (Signed)
  Interdisciplinary Treatment Plan Update   Date Reviewed:  12/22/2013  Time Reviewed:  8:38 AM  Progress in Treatment:   Attending groups: Yes Participating in groups: Yes Taking medication as prescribed: Yes  Tolerating medication: Yes Family/Significant other contact made: No Patient understands diagnosis: Yes AEB asking to get back on meds; challenges with cocaine use Discussing patient identified problems/goals with staff: Yes  See initial care plan Medical problems stabilized or resolved: Yes Denies suicidal/homicidal ideation: Yes In tx team Patient has not harmed self or others: Yes  For review of initial/current patient goals, please see plan of care.  Estimated Length of Stay:  4-5 days  Reason for Continuation of Hospitalization: Medication stabilization Other; describe Mood lability, disorganization  New Problems/Goals identified:  N/A  Discharge Plan or Barriers:   return home, follow up outpt  Additional Comments: Elizabeth [name] is an 23 y.o. female. Patient was brought into the ED by her parents b/o increased anxeity, drug use, and frequent mood swings. Patient currently denies SI/HI, hallucinations, and other self-injurious behaviros. Patient reports symptoms such as increased anxiety, poor concentration, racing thoughts, rapid mood swings, decreased sleeping patterns, and decreased appetite. Patient reports smoking marijuana daily about an ounce, snorting percocets an unknown amount, and cocaine and unknown. Patient reports current withdrawal symptoms include increased anxiety.  CSW spoke with the patient's parents as they were in the room during the assessment per the patient's request. Mother reports that the patient was clean until 3 weeks ago and since that time she quickly decline in mental status. Mother reports that the patient is very irritable, laughing inappropriately, crying, and starting multiple conversations but never completing a thought. Patient is  being monitored by Dr [facility] RosenthalJackson [facility] and the next appointment is a week from today.     Attendees:  Signature: Thedore MinsMojeed Akintayo, MD 12/22/2013 8:38 AM   Signature: Richelle Itood Jo Booze, LCSW 12/22/2013 8:38 AM  Signature: Fransisca KaufmannLaura Davis, NP 12/22/2013 8:38 AM  Signature: Joslyn Devonaroline Beaudry, RN 12/22/2013 8:38 AM  Signature: Liborio NixonPatrice White, RN 12/22/2013 8:38 AM  Signature:  12/22/2013 8:38 AM  Signature:   12/22/2013 8:38 AM  Signature:    Signature:    Signature:    Signature:    Signature:    Signature:      Scribe for Treatment Team:   Richelle Itood Reg Bircher, LCSW  12/22/2013 8:38 AM
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
## 2013-12-22 NOTE — [facility] Group Notes (Signed)
[facility] LCSW Group Therapy  12/22/2013 1:15 pm  Type of Therapy: Process Group Therapy  Participation Level:  Active  Participation Quality:  Appropriate  Affect:  Flat  Cognitive:  Oriented  Insight:  Improving  Engagement in Group:  Limited  Engagement in Therapy:  Limited  Modes of Intervention:  Activity, Clarification, Education, Problem-solving and Support  Summary of Progress/Problems: Today's group addressed the issue of overcoming obstacles.  Patients were asked to identify their biggest obstacle post d/c that stands in the way of their on-going success, and then problem solve as to how to manage this.  Spent some time being angry about feeling that she is stuck and not making progress due to SA, and and dropping the "f" bomb until I shut her down.  Was able to pull it together and talk about how she needs more structure like another patient.  I suggested IOP, and she liked the answer.    Ida Rogueorth, Elizbeth Posa B 12/22/2013   3:38 PM

## 2013-12-22 NOTE — Progress Notes (Signed)
Patient has been calm and cooperative. Mother was in to visit and patient interacted appropriately with mother. Patient denies SI and HI. Affect bright. Mood pleasant. Compliant with medication. Attended group. No problems or complaints. Billy CoastGoodman, Eliu Batch K, RN

## 2013-12-22 NOTE — Progress Notes (Signed)
NUTRITION ASSESSMENT  Pt identified as at risk on the Malnutrition Screen Tool  INTERVENTION: 1. Educated patient on the importance of nutrition and encouraged intake of food and beverages. 2. Discussed weight goals. 3. Supplements: Ensure Complete po BID, each supplement provides 350 kcal and 13 grams of protein   NUTRITION DIAGNOSIS: Unintentional weight loss related to sub-optimal intake as evidenced by pt report.   Goal: Pt to meet >/= 90% of their estimated nutrition needs.  Monitor:  PO intake  Assessment:  Patient with hx of bipolar disorder and polysubstance abuse who was admitted due to escalating manic symptoms.  Uses THC and cocaine and patient reported Methamphetamines.  States that she has had a poor appetite since beginning drugs in 2009.  Reports UBW of 192 lbs in January with weight loss (14%) since January.  States she is now eating fair but requests Ensure.  23 y.o. female  Height: Ht Readings from Last 1 Encounters:  12/21/13 5\' 5"  (1.651 m)    Weight: Wt Readings from Last 1 Encounters:  12/21/13 165 lb (74.844 kg)    Weight Hx: Wt Readings from Last 10 Encounters:  12/21/13 165 lb (74.844 kg)  12/09/13 166 lb (75.297 kg)  11/22/13 175 lb 3.2 oz (79.47 kg)  08/07/12 148 lb 8 oz (67.359 kg)    BMI:  Body mass index is 27.46 kg/(m^2). Pt meets criteria for overweight based on current BMI.  Estimated Nutritional Needs: Kcal: 25-30 kcal/kg Protein: > 1 gram protein/kg Fluid: 1 ml/kcal  Diet Order: General Pt is also offered choice of unit snacks mid-morning and mid-afternoon.  Pt is eating as desired.   Lab results and medications reviewed.   Oran ReinLaura Jobe, RD, LDN Clinical Inpatient Dietitian Pager:  [phone] Weekend and after hours pager:  [phone]

## 2013-12-22 NOTE — Progress Notes (Signed)
Nursing Shift Assessment. 7a-7p.   D: Affect is bright and mood is labile with pt cycling from being happy to anxious and angry. She envisions herself as being an Pharmacist, hospitalentrepreneur and wants to start her own business. 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  Identifies voices or thoughts, of needing to become productive with her life. Requested one 25 mg Vistaril for anxiety and said that she is going to only have one today because she wants to cut back. Admits to having a problem with marijuana and cocaine.   A: Spent 1:1 time with pt. Support and encouragement offered.  Medicated for anxiety as per orders.  R:  Contracts for safety and continues to follow the treatment plan, working on learning new coping skills.
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
## 2013-12-22 NOTE — Progress Notes (Signed)
The focus of this group is to help patients review their daily goal of treatment and discuss progress on daily workbooks. Pt attended the evening group session and responded to all discussion prompts from the Writer. Pt reported having had an okay day, the highlight of which was being able to take her weave out. "My hair feels so much better right now." Pt's only additional request from Nursing Staff this evening was for towels, which were given to her following group. Pt appeared distracted during group, but appeared in good spirits when directly addressed and demonstrated a sense of humor in her interactions.

## 2013-12-23 MED ORDER — HALOPERIDOL LACTATE 5 MG/ML IJ SOLN
5.0000 mg | Freq: Once | INTRAMUSCULAR | Status: AC
  Administered 2013-12-23: 5 mg via INTRAMUSCULAR
  Filled 2013-12-23 (×2): qty 1

## 2013-12-23 MED ORDER — DIPHENHYDRAMINE HCL 50 MG/ML IJ SOLN
50.0000 mg | Freq: Once | INTRAMUSCULAR | Status: AC
  Administered 2013-12-23: 50 mg via INTRAMUSCULAR
  Filled 2013-12-23 (×2): qty 1

## 2013-12-23 MED ORDER — LORAZEPAM 2 MG/ML IJ SOLN
2.0000 mg | Freq: Once | INTRAMUSCULAR | Status: AC
  Administered 2013-12-23: 2 mg via INTRAMUSCULAR
  Filled 2013-12-23: qty 1

## 2013-12-23 NOTE — [facility] Group Notes (Signed)
[facility] LCSW Group Therapy  12/23/2013 , 12:46 PM   Type of Therapy:  Group Therapy  Participation Level:  Active  Participation Quality:  Attentive  Affect:  Appropriate  Cognitive:  Alert  Insight:  Improving  Engagement in Therapy:  Engaged  Modes of Intervention:  Discussion, Exploration and Socialization  Summary of Progress/Problems: Today's group focused on the term Diagnosis.  Participants were asked to define the term, and then pronounce whether it is a negative, positive or neutral term.  Sander stayed for the entire group, and was engaged.  She enjoys engaging with other patients in debate, and is reasonable good at it.  She also makes some statements that make no sense whatsoever.  For instance, "I didn't know you could drop out of school at 14.  If I knew that, I would have.  I was too smart for school."  While her intelligence is high, her rationale makes no sense.    Daryel Geraldorth, Barba Solt B 12/23/2013 , 12:46 PM
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
## 2013-12-23 NOTE — Progress Notes (Signed)
Adult Psychoeducational Group Note  Date:  12/23/2013 Time:  9:32 PM  Group Topic/Focus:  Wrap-Up Group:   The focus of this group is to help patients review their daily goal of treatment and discuss progress on daily workbooks.  Participation Level:  Minimal  Participation Quality:  Minimal  Affect:  Flat  Cognitive:  Appropriate  Insight: Limited  Engagement in Group:  Limited  Modes of Intervention:  Socialization and Support  Additional Comments:  Patient attend but had limited participation in group tonight. She reports that she did not feel like sharing what happen in her day today. She advised that he day was a mess.  Lita MainsFrancis, Esther Bradstreet [facility] 12/23/2013, 9:32 PM

## 2013-12-23 NOTE — Progress Notes (Signed)
D: Patient denies SI/HI. The patient is having auditory and visual hallucinations and reports seeing and hearing ghosts. The patient has an anxious mood and affect. The patient reports being concerned about her follow-up appointments and about her discharge date. The patient is attending groups and interacting appropriately within the milieu. 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 Patient encouraged to come to staff with concerns and/or questions. Patient's medication routine continued. Patient's orders and plan of care reviewed. MD notified about patient's discharge concerns. Patient referred to case management for follow-up care questions.  R: Patient remains cooperative. Will continue to monitor patient q15 minutes for safety.

## 2013-12-23 NOTE — Progress Notes (Signed)
Pt becoming increasingly agitated and demanding to leave. Pt using profanity and stating that she will call her bishop or pastor to get her up out of here. Clinical research associatewriter informed pt that only the MD will be allowed to d/c pt. Pt verbally requesting to get an injection to calm her down or she was going to find a way out. PA notified. Verbal Orders given.

## 2013-12-24 MED ORDER — LURASIDONE HCL 80 MG PO TABS
80.0000 mg | ORAL_TABLET | Freq: Every day | ORAL | Status: DC
  Administered 2013-12-24 – 2013-12-25 (×2): 80 mg via ORAL
  Filled 2013-12-24 (×3): qty 1

## 2013-12-24 MED ORDER — TRAZODONE HCL 150 MG PO TABS
150.0000 mg | ORAL_TABLET | Freq: Every day | ORAL | Status: DC
  Filled 2013-12-24 (×6): qty 1

## 2013-12-24 NOTE — Progress Notes (Signed)
Adult Psychoeducational Group Note  Date:  12/24/2013 Time:  2:50 PM  Group Topic/Focus:  Personal Choices and Values:   The focus of this group is to help patients assess and explore the importance of values in their lives, how their values affect their decisions, how they express their values and what opposes their expression.  Participation Level:  Did Not Attend  Additional Comments:  Patient discussed personal values and tried to come up with new ways to incorporate positive values into their lives. Patient stated she valued her mother and her step-father, her hair and religion. Patient stated she would like to work towards having more emotional stability, being creative, staying clean and having more positive friends.  Merleen MillinerCataldo, Treavor Blomquist Y 12/24/2013, 2:50 PM

## 2013-12-24 NOTE — Progress Notes (Signed)
D: Patient denies SI/HI and auditory and visual hallucinations. The patient has an anxious mood and affect. The patient remains emotionally labile and states "It's hard to contain all of my emotions." The patient reports racing thoughts and irritability as well.  A: Patient given emotional support from RN. Patient encouraged to come to staff with concerns and/or questions. Patient's medication routine continued. Patient's orders and plan of care reviewed. Patient given PRN zyprexa for agitation.  R: Patient remains cooperative. Will continue to monitor patient q15 minutes for safety.

## 2013-12-24 NOTE — Progress Notes (Signed)
Patient ID: Elizabeth [name], female   DOB: June 08, 1990, 23 y.o.   MRN: [id number]  [facility] MD Progress Note  12/24/2013 10:36 AM Elizabeth [name]  MRN:  [id number]  Subjective: Patient states "I am still getting angry whenever I talk to my mother, I don't want to get upset she is the only person that cares about me.''  Objective: Patient seen and chart reviewed. She reports ongoing mood lability, decreased need for sleep, agitation, racing thoughts and irritability. However, she states that she is no longer having auditory/visual hallucinations or feeling suicidal. Patient also denies cravings for illicit drugs , she is asking to be referred to a drug rehab program. Patient has been compliant with the unit milieu and her medications. She has not verbalized any adverse reactions.  Diagnosis:   AXIS I: Bipolar I disorder, most recent episode (or current) manic  Cocaine use disorder  Marijuana use disorder  AXIS II: Cluster B Traits  AXIS III:  Past Medical History   Diagnosis  Date   .  Bipolar 1 disorder     AXIS IV: other psychosocial or environmental problems and problems related to social environment  AXIS V: 21-30 behavior considerably influenced by delusions or hallucinations OR serious impairment in judgment, communication OR inability to function in almost all areas  ADL's:  Intact  Sleep: fair  Appetite:  Fair  Suicidal Ideation: denies Plan:  denies Intent:  denies Means:  denies Homicidal Ideation: denies Plan:  denies Intent:  denies Means:  denies AEB (as evidenced by):  Psychiatric Specialty Exam: Review of Systems  Constitutional: Negative.   HENT: Negative.   Eyes: Negative.   Respiratory: Negative.   Cardiovascular: Negative.   Gastrointestinal: Negative.   Genitourinary: Negative.   Musculoskeletal: Negative.   Skin: Negative.   Neurological: Negative.   Psychiatric/Behavioral: Positive for substance abuse. The patient is nervous/anxious and has insomnia.      Blood pressure 120/74, pulse 97, temperature 98 F (36.7 C), temperature source Oral, resp. rate 18, height 5\' 5"  (1.651 m), weight 74.844 kg (165 lb), last menstrual period 11/03/2013, SpO2 100.00%.Body mass index is 27.46 kg/(m^2).   General Appearance: Fairly Groomed   Patent attorney:: Good   Speech: Pressured   Volume: Increased   Mood: Irritable   Affect: Labile and Full Range   Thought Process: Circumstantial   Orientation: Full (Time, Place, and Person)   Thought Content: Hallucinations: Auditory  Visual and Paranoid Ideation   Suicidal Thoughts: No   Homicidal Thoughts: No   Memory: Immediate; Fair  Recent; Fair  Remote; Fair   Judgement: Impaired   Insight: Lacking   Psychomotor Activity: Increased   Concentration: Fair   Recall: [employer] of Knowledge:Fair   Language: Good   Akathisia: No   Handed: Right   AIMS (if indicated):   Assets: Communication Skills  Desire for Improvement  Physical Health   Sleep: Number of Hours: 4     Musculoskeletal:  Strength & Muscle Tone: within normal limits  Gait & Station: normal  Patient leans: N/A   Current Medications: Current Facility-Administered Medications  Medication Dose Route Frequency Provider Last Rate Last Dose  . acetaminophen (TYLENOL) tablet 650 mg  650 mg Oral Q6H PRN Beau Fanny, FNP   650 mg at 12/23/13 1019  . alum & mag hydroxide-simeth (MAALOX/MYLANTA) 200-200-20 MG/5ML suspension 30 mL  30 mL Oral Q4H PRN Beau Fanny, FNP      . carbamazepine (TEGRETOL XR) 12 hr tablet  200 mg  200 mg Oral BID Sona Nations   200 mg at 12/24/13 0729  . feeding supplement (ENSURE COMPLETE) (ENSURE COMPLETE) liquid 237 mL  237 mL Oral BID BM Jeoffrey MassedLaura Lee Jobe, RD   237 mL at 12/23/13 0630  . hydrOXYzine (ATARAX/VISTARIL) tablet 25 mg  25 mg Oral TID PRN Latajah Thuman   25 mg at 12/23/13 1628  . lurasidone (LATUDA) tablet 80 mg  80 mg Oral Q supper Kayleigh Broadwell      . magnesium hydroxide (MILK OF MAGNESIA)  suspension 30 mL  30 mL Oral Daily PRN Beau FannyJohn C Withrow, FNP      . nicotine (NICODERM CQ - dosed in mg/24 hours) patch 21 mg  21 mg Transdermal Q0600 Beau FannyJohn C Withrow, FNP   21 mg at 12/24/13 0600  . OLANZapine zydis (ZYPREXA) disintegrating tablet 10 mg  10 mg Oral Q8H PRN Nikeia Henkes   10 mg at 12/24/13 1000  . traZODone (DESYREL) tablet 150 mg  150 mg Oral QHS Kinta Martis        Lab Results:  No results found for this or any previous visit (from the past 48 hour(s)).  Physical Findings: AIMS: Facial and Oral Movements Muscles of Facial Expression: None, normal Lips and Perioral Area: None, normal Jaw: None, normal Tongue: None, normal,Extremity Movements Upper (arms, wrists, hands, fingers): None, normal Lower (legs, knees, ankles, toes): None, normal, Trunk Movements Neck, shoulders, hips: None, normal, Overall Severity Severity of abnormal movements (highest score from questions above): None, normal Incapacitation due to abnormal movements: None, normal Patient's awareness of abnormal movements (rate only patient's report): No Awareness, Dental Status Current problems with teeth and/or dentures?: No Does patient usually wear dentures?: No  CIWA:    COWS:     Treatment Plan Summary: Daily contact with patient to assess and evaluate symptoms and progress in treatment Medication management  Plan: Review of chart, vital signs, medications, and notes.  1-Individual and group therapy  2-Medication management for Bipolar disorder: -Continue Tegretol XR 200mg  bid for mood stabilization. -Increase Latuda to 80mg  daily after dinner for mood -Increase Trazodone to 150mg  po Qhs for insomnia. 3-Coping skills for depression, anxiety  4-Continue crisis stabilization and management  5-Address health issues--monitoring vital signs, stable  6-Treatment plan in progress to prevent relapse of depression and anxiety   Medical Decision Making Problem Points:  Established problem,  improving (1), Review of last therapy session (1) and Review of psycho-social stressors (1) Data Points:  Order Aims Assessment (2) Review of medication regiment & side effects (2) Review of new medications or change in dosage (2)  I certify that inpatient services furnished can reasonably be expected to improve the patient's condition.   Thedore MinsAkintayo, Elgin Carn, MD 12/24/2013, 10:36 AM

## 2013-12-24 NOTE — Progress Notes (Signed)
Adult Psychoeducational Group Note  Date:  12/24/2013 Time:  9:36 PM  Group Topic/Focus:  Wrap-Up Group:   The focus of this group is to help patients review their daily goal of treatment and discuss progress on daily workbooks.  Participation Level:  Minimal  Participation Quality:  Appropriate  Affect:  Lethargic  Cognitive:  Appropriate  Insight: Limited  Engagement in Group:  Limited  Modes of Intervention:  Socialization and Support  Additional Comments:  Patient attended and participated in group tonight. She reports having a good day. She wash her clothing, went for her groups, attended her meals and went outside for some fresh air. For her personal development she plans to take it one day at a time.  Elizabeth MainsFrancis, [facility] [name] [facility] 12/24/2013, 9:36 PM

## 2013-12-24 NOTE — Progress Notes (Signed)
D: Pt currently denying any SI/HI/AVH. Pt is reporting that she has been able to function on her Latuda without any excessive drowsiness. She appreciates this factor as she has the need to work. Pt has been actively participating within the milieu.  A:  Continued support and availability as needed was extended to this pt. Staff continue to monitor pt with q3115min checks.  R: No adverse drug reactions noted or reported. Pt receptive to treatment. Pt remains safe at this time.

## 2013-12-24 NOTE — Progress Notes (Signed)
D: pt denies si/hi/avh and pain. Pt became increasingly agitated as the evening progressed. Pt upset/irritable because she was feeling claustrophobic and locked in. Pt demanding to get fresh air and leave. Pt stated she doesn't know why she is here and believes that her mother is just trying to pisses her off by forcing her to be here.  A: IM given to pt after PA notified of pt behavior. Pt refused trazodone this evening stating it gives her nightmares. q 15 min safety checks. Support and encouragement given along with 1:1 time R: pt less agitated after IM given. Pt stated she felt better after IM were given. Pt remains safe on unit. No further signs of stress noted.

## 2013-12-24 NOTE — Progress Notes (Signed)
Patient ID: Elizabeth BeckBrittney L Arseneault, female   DOB: Dec 01, 1990, 23 y.o.   MRN: [id number] In bed, eyes closed, appears to be sleeping. Respirations even and unlabored. 15 min check in place, safety maintained

## 2013-12-25 DIAGNOSIS — F1910 Other psychoactive substance abuse, uncomplicated: Secondary | ICD-10-CM

## 2013-12-25 MED ORDER — OLANZAPINE 10 MG PO TABS
10.0000 mg | ORAL_TABLET | Freq: Once | ORAL | Status: AC
  Administered 2013-12-25: 10 mg via ORAL
  Filled 2013-12-25: qty 1

## 2013-12-25 NOTE — Progress Notes (Addendum)
Patient stated she is ready for discharge.  She wants to go outside and get off [address], cannot stand redundancy.  That is why she has quit her many jobs.  Feels she only took cocaine and THC and was not thinking right, that is why she came in here.  Feels she is thinking right way now and wants to be discharged.    Patient given zyprexa 10 mg one time order for agitation/anxiety per MD verbal order.  Plans for patient to discharge Monday to rehabilitation.  Osvaldo HumanSaundra [facility] [place] phone [phone] ext [phone], reference [id number] called and patient's medication information was relayed to her.  NP aware of phone call.  D:  Patient denied SI and HI, contracts for safety.  Denied A/V hallucinations.  Slept good last night, requested sleep medication.  Fair appetite, low energy level, poor concentration.  Rated depression 4, denied hopeless, rated anxiety 10.  Goal today is to get better.  Plans to discharge to Rehabilitation.  Has callus on bottom of left large toe.  Needs financial assistance with medications after discharge.  A:  Medications administered per MD orders.  Emotional support and encouragement given patient. R:  Safety continues with 15 minute checks.   Patient to program on [address].

## 2013-12-25 NOTE — Progress Notes (Signed)
D: Pt presents with labile affect and mood.  Pt initially calm, cooperative.  Later in shift, pt stated "I hate men.  I don't like dealing with them.  I'm not gay or anything."  Pt reported she was having visual hallucinations of seeing the devil and auditory hallucinations of God talking to her. Pt asked "what do I do to make those hallucinations go away?" Pt denies SI/HI.  Pt approached Clinical research associatewriter and said "I need something because I'm usually not like this and I'm irritable.  I mean, I just said I hated men and I don't know why I said that."   A: Pt received PRN for agitation.  Encouragement and support provided.  Encouraged pt to report hallucinations to MD.  Redirected pt as needed.  Safety maintained. R: Pt receptive to redirection.  PRN medication for agitation was effective.  Pt said she would notify staff if thinking of harming self or others.  Reported she would notify MD of hallucinations.

## 2013-12-25 NOTE — Progress Notes (Signed)
Patient ID: Elizabeth [name], female   DOB: 24-Jul-1990, 23 y.o.   MRN: [id number]  [facility] MD Progress Note  12/25/2013 11:06 AM Elizabeth [name]  MRN:  [id number]  Subjective: Patient states  "It's hard to contain all of my emotions. I am feeling irritable and angry.''   Objective: Patient seen and her chart was reviewed. Patient  Reports that she has been having emotional roller coaster characterized by  mood lability, emotional outburst, crying episodes, anxiety, worries, racing thoughts and decreased need for sleep. Nursing staffs reports that patient emotion is so bad in the last 24 hours she has been required multiple doses of Zyprexa Zydis for mood stabilization. Patient denies auditory/visual hallucinations or  Suicidal thoughts. She also denies cravings for Cocaine and Marijuana, she has been referred to a drug rehab program in [place] but a bed will not be available until next week. Patient has been compliant with the unit milieu and her medications. She has not verbalized any adverse reactions.  Diagnosis:   AXIS I: Bipolar I disorder, most recent episode (or current) manic           Cocaine use disorder           Marijuana use disorder           History of Polysubstance abuse. AXIS II: Cluster B Traits  AXIS III:  Past Medical History   Diagnosis  Date   .  Bipolar 1 disorder     AXIS IV: other psychosocial or environmental problems and problems related to social environment  AXIS V: 21-30 behavior considerably influenced by delusions or hallucinations OR serious impairment in judgment, communication OR inability to function in almost all areas  ADL's:  Intact  Sleep: fair  Appetite:  Fair  Suicidal Ideation: denies Plan:  denies Intent:  denies Means:  denies Homicidal Ideation: denies Plan:  denies Intent:  denies Means:  denies AEB (as evidenced by):  Psychiatric Specialty Exam: Review of Systems  Constitutional: Negative.   HENT: Negative.   Eyes: Negative.    Respiratory: Negative.   Cardiovascular: Negative.   Gastrointestinal: Negative.   Genitourinary: Negative.   Musculoskeletal: Negative.   Skin: Negative.   Neurological: Negative.   Psychiatric/Behavioral: Positive for substance abuse. The patient is nervous/anxious and has insomnia.     Blood pressure 114/54, pulse 86, temperature 98.1 F (36.7 C), temperature source Oral, resp. rate 18, height 5\' 5"  (1.651 m), weight 74.844 kg (165 lb), last menstrual period 11/03/2013, SpO2 100.00%.Body mass index is 27.46 kg/(m^2).   General Appearance: Fairly Groomed   Patent attorney:: Good   Speech: Pressured   Volume: Increased   Mood: Irritable   Affect: Labile and Full Range   Thought Process: Circumstantial   Orientation: Full (Time, Place, and Person)   Thought Content: Hallucinations: Auditory  Visual and Paranoid Ideation   Suicidal Thoughts: No   Homicidal Thoughts: No   Memory: Immediate; Fair  Recent; Fair  Remote; Fair   Judgement: Impaired   Insight: Lacking   Psychomotor Activity: Increased   Concentration: Fair   Recall: [employer] of Knowledge:Fair   Language: Good   Akathisia: No   Handed: Right   AIMS (if indicated):   Assets: Communication Skills  Desire for Improvement  Physical Health   Sleep: Number of Hours: 4     Musculoskeletal:  Strength & Muscle Tone: within normal limits  Gait & Station: normal  Patient leans: N/A   Current Medications: Current Facility-Administered Medications  Medication Dose Route Frequency Provider Last Rate Last Dose  . acetaminophen (TYLENOL) tablet 650 mg  650 mg Oral Q6H PRN Elizabeth FannyJohn C Withrow, FNP   650 mg at 12/23/13 1019  . alum & mag hydroxide-simeth (MAALOX/MYLANTA) 200-200-20 MG/5ML suspension 30 mL  30 mL Oral Q4H PRN Elizabeth FannyJohn C Withrow, FNP      . carbamazepine (TEGRETOL XR) 12 hr tablet 200 mg  200 mg Oral BID Krissy Orebaugh   200 mg at 12/25/13 0754  . feeding supplement (ENSURE COMPLETE) (ENSURE COMPLETE) liquid 237  mL  237 mL Oral BID BM Jeoffrey MassedLaura Lee Jobe, RD   237 mL at 12/25/13 0758  . hydrOXYzine (ATARAX/VISTARIL) tablet 25 mg  25 mg Oral TID PRN Gertrue Willette   25 mg at 12/25/13 0757  . lurasidone (LATUDA) tablet 80 mg  80 mg Oral Q supper Tansy Lorek   80 mg at 12/24/13 1652  . magnesium hydroxide (MILK OF MAGNESIA) suspension 30 mL  30 mL Oral Daily PRN Elizabeth FannyJohn C Withrow, FNP      . nicotine (NICODERM CQ - dosed in mg/24 hours) patch 21 mg  21 mg Transdermal Q0600 Elizabeth FannyJohn C Withrow, FNP   21 mg at 12/25/13 [phone]  . OLANZapine zydis (ZYPREXA) disintegrating tablet 10 mg  10 mg Oral Q8H PRN Quinlyn Tep   10 mg at 12/25/13 0756  . traZODone (DESYREL) tablet 150 mg  150 mg Oral QHS Dua Mehler        Lab Results:  No results found for this or any previous visit (from the past 48 hour(s)).  Physical Findings: AIMS: Facial and Oral Movements Muscles of Facial Expression: None, normal Lips and Perioral Area: None, normal Jaw: None, normal Tongue: None, normal,Extremity Movements Upper (arms, wrists, hands, fingers): None, normal Lower (legs, knees, ankles, toes): None, normal, Trunk Movements Neck, shoulders, hips: None, normal, Overall Severity Severity of abnormal movements (highest score from questions above): None, normal Incapacitation due to abnormal movements: None, normal Patient's awareness of abnormal movements (rate only patient's report): No Awareness, Dental Status Current problems with teeth and/or dentures?: No Does patient usually wear dentures?: No  CIWA:    COWS:     Treatment Plan Summary: Daily contact with patient to assess and evaluate symptoms and progress in treatment Medication management  Plan: Review of chart, vital signs, medications, and notes.  1-Individual and group therapy  2-Medication management for Bipolar disorder: -Continue Tegretol XR 200mg  bid for mood stabilization. - Continue Latuda  80mg  daily after dinner for mood -Continue Trazodone  150mg  po  Qhs for insomnia. -Continue Zyprexa Zydis 10mg  q8h as needed for agitation. 3-Coping skills for depression, anxiety  4-Continue crisis stabilization and management  5-Address health issues--monitoring vital signs, stable  6-Treatment plan in progress to prevent relapse of depression and anxiety   Medical Decision Making Problem Points:  Established problem, improving (1), Review of last therapy session (1) and Review of psycho-social stressors (1) Data Points:  Order Aims Assessment (2) Review of medication regiment & side effects (2) Review of new medications or change in dosage (2)  I certify that inpatient services furnished can reasonably be expected to improve the patient's condition.   Thedore MinsAkintayo, Chelsei Mcchesney, MD 12/25/2013, 11:05 AM

## 2013-12-25 NOTE — Progress Notes (Signed)
The focus of this group is to educate the patient on the purpose and policies of crisis stabilization and provide a format to answer questions about their admission.  The group details unit policies and expectations of patients while admitted.  Patient came to nurse education orientation group this morning.  Patient was alert.  Patient was argumentative during group.

## 2013-12-25 NOTE — Tx Team (Signed)
  Interdisciplinary Treatment Plan Update   Date Reviewed:  12/25/2013  Time Reviewed:  11:04 AM  Progress in Treatment:   Attending groups: Yes Participating in groups: Yes Taking medication as prescribed: Yes  Tolerating medication: Yes Family/Significant other contact made: Yes  Patient understands diagnosis: Yes  Discussing patient identified problems/goals with staff: Yes Medical problems stabilized or resolved: Yes Denies suicidal/homicidal ideation: Yes Patient has not harmed self or others: Yes  For review of initial/current patient goals, please see plan of care.  Estimated Length of Stay:  3-4 days  Reason for Continuation of Hospitalization: Medication stabilization Other; describe lability, mood instability  New Problems/Goals identified:  N/A  Discharge Plan or Barriers:   [facility]  Additional Comments:  "It's hard to contain all of my emotions. I am feeling irritable and angry.''   Patient Reports that she has been having emotional roller coaster characterized by mood lability, emotional outburst, crying episodes, anxiety, worries, racing thoughts and decreased need for sleep. Nursing staffs reports that patient emotion is so bad in the last 24 hours she has been required multiple doses of Zyprexa Zydis for mood stabilization.   Attendees:  Signature: Thedore MinsMojeed Akintayo, MD 12/25/2013 11:04 AM   Signature: Richelle Itood Yates Weisgerber, LCSW 12/25/2013 11:04 AM  Signature: Fransisca KaufmannLaura Davis, NP 12/25/2013 11:04 AM  Signature: Joslyn Devonaroline Beaudry, RN 12/25/2013 11:04 AM  Signature: Liborio NixonPatrice White, RN 12/25/2013 11:04 AM  Signature:  12/25/2013 11:04 AM  Signature:   12/25/2013 11:04 AM  Signature:    Signature:    Signature:    Signature:    Signature:    Signature:      Scribe for Treatment Team:   Richelle Itood Therron Sells, LCSW  12/25/2013 11:04 AM

## 2013-12-25 NOTE — [facility] Group Notes (Signed)
[facility] Group Notes:  (Nursing/MHT/Case Management/Adjunct)  Date:  12/25/2013  Time:  0900 am  Type of Therapy:  Psychoeducational Skills  Participation Level:  Active  Participation Quality:  Appropriate  Affect:  Irritable  Cognitive:  Alert  Insight:  Lacking  Engagement in Group:  Distracting  Modes of Intervention:  Clarification  Summary of Progress/Problems:  Cranford MonBeaudry, Lanyla Costello Evans 12/25/2013, 2:04 PM

## 2013-12-26 MED ORDER — DIPHENHYDRAMINE HCL 50 MG/ML IJ SOLN
INTRAMUSCULAR | Status: AC
Start: 2013-12-26 — End: 2013-12-26
  Administered 2013-12-26: 50 mg via INTRAMUSCULAR
  Filled 2013-12-26: qty 1

## 2013-12-26 MED ORDER — LORAZEPAM 2 MG/ML IJ SOLN
2.0000 mg | Freq: Once | INTRAMUSCULAR | Status: AC
  Administered 2013-12-26: 2 mg via INTRAMUSCULAR

## 2013-12-26 MED ORDER — BENZTROPINE MESYLATE 1 MG PO TABS
1.0000 mg | ORAL_TABLET | Freq: Every day | ORAL | Status: DC
  Administered 2013-12-26 – 2013-12-28 (×3): 1 mg via ORAL
  Filled 2013-12-26 (×4): qty 1

## 2013-12-26 MED ORDER — DIPHENHYDRAMINE HCL 50 MG/ML IJ SOLN
50.0000 mg | Freq: Once | INTRAMUSCULAR | Status: AC
  Administered 2013-12-26: 50 mg via INTRAMUSCULAR

## 2013-12-26 MED ORDER — HALOPERIDOL LACTATE 5 MG/ML IJ SOLN
INTRAMUSCULAR | Status: AC
  Filled 2013-12-26: qty 1

## 2013-12-26 MED ORDER — HALOPERIDOL LACTATE 5 MG/ML IJ SOLN
5.0000 mg | Freq: Once | INTRAMUSCULAR | Status: AC
  Administered 2013-12-26: 5 mg via INTRAMUSCULAR

## 2013-12-26 MED ORDER — LURASIDONE HCL 80 MG PO TABS
120.0000 mg | ORAL_TABLET | Freq: Every day | ORAL | Status: DC
  Administered 2013-12-26 – 2013-12-28 (×3): 120 mg via ORAL
  Filled 2013-12-26 (×6): qty 1

## 2013-12-26 MED ORDER — LORAZEPAM 2 MG/ML IJ SOLN
INTRAMUSCULAR | Status: AC
  Administered 2013-12-26: 2 mg via INTRAMUSCULAR
  Filled 2013-12-26: qty 1

## 2013-12-26 NOTE — Progress Notes (Signed)
Patient ID: Elizabeth [name], female   DOB: 23-Jul-1990, 23 y.o.   MRN: [id number]  [facility] MD Progress Note  12/26/2013 10:32 AM DERYL PORTS  MRN:  [id number]  Subjective: Patient states  "I am getting really angry and upset staying here, I want you to discharge me.''   Objective: Patient seen and her chart was reviewed. Patient reports mood lability, getting overwhelmed, emotional outburst, crying episodes, anxiety and  racing thoughts. However, patient has been sleeping much better but she continues to require extra doses of Zyprexa Zydis for mood stabilization. Patient denies auditory/visual hallucinations or Suicidal thoughts. But today she reports cravings for Cocaine and Marijuana. She  has been referred to a drug rehab program in [place] but a bed will not be available until next week possibly Monday. Patient has been compliant with the unit milieu and her medications. She has not verbalized any adverse reactions.  Diagnosis:   AXIS I: Bipolar I disorder, most recent episode (or current) manic           Cocaine use disorder           Marijuana use disorder           History of Polysubstance abuse. AXIS II: Cluster B Traits  AXIS III:  Past Medical History   Diagnosis  Date   .  Bipolar 1 disorder     AXIS IV: other psychosocial or environmental problems and problems related to social environment  AXIS V: 40-50   ADL's:  Intact  Sleep: fair  Appetite:  Fair  Suicidal Ideation: denies Plan:  denies Intent:  denies Means:  denies Homicidal Ideation: denies Plan:  denies Intent:  denies Means:  denies AEB (as evidenced by):  Psychiatric Specialty Exam: Review of Systems  Constitutional: Negative.   HENT: Negative.   Eyes: Negative.   Respiratory: Negative.   Cardiovascular: Negative.   Gastrointestinal: Negative.   Genitourinary: Negative.   Musculoskeletal: Negative.   Skin: Negative.   Neurological: Negative.   Psychiatric/Behavioral: Positive for substance  abuse. The patient is nervous/anxious.        Labile mood, agitation    Blood pressure 115/65, pulse 72, temperature 97.9 F (36.6 C), temperature source Oral, resp. rate 18, height 5\' 5"  (1.651 m), weight 74.844 kg (165 lb), last menstrual period 11/03/2013, SpO2 100.00%.Body mass index is 27.46 kg/(m^2).   General Appearance: Fairly Groomed   Patent attorney:: Good   Speech: Pressured   Volume: Increased   Mood: Irritable   Affect: Labile and Full Range   Thought Process: Circumstantial   Orientation: Full (Time, Place, and Person)   Thought Content: Hallucinations: Auditory  Visual and Paranoid Ideation   Suicidal Thoughts: No   Homicidal Thoughts: No   Memory: Immediate; Fair  Recent; Fair  Remote; Fair   Judgement: Impaired   Insight: Lacking   Psychomotor Activity: Increased   Concentration: Fair   Recall: [employer] of Knowledge:Fair   Language: Good   Akathisia: No   Handed: Right   AIMS (if indicated):   Assets: Communication Skills  Desire for Improvement  Physical Health   Sleep: Number of Hours: 4     Musculoskeletal:  Strength & Muscle Tone: within normal limits  Gait & Station: normal  Patient leans: N/A   Current Medications: Current Facility-Administered Medications  Medication Dose Route Frequency Provider Last Rate Last Dose  . acetaminophen (TYLENOL) tablet 650 mg  650 mg Oral Q6H PRN Beau Fanny, FNP   [phone]  mg at 12/25/13 1708  . alum & mag hydroxide-simeth (MAALOX/MYLANTA) 200-200-20 MG/5ML suspension 30 mL  30 mL Oral Q4H PRN Beau FannyJohn C Withrow, FNP      . carbamazepine (TEGRETOL XR) 12 hr tablet 200 mg  200 mg Oral BID Alyssabeth Bruster   200 mg at 12/26/13 0757  . diphenhydrAMINE (BENADRYL) injection 50 mg  50 mg Intramuscular Once Kennet Mccort      . feeding supplement (ENSURE COMPLETE) (ENSURE COMPLETE) liquid 237 mL  237 mL Oral BID BM Jeoffrey MassedLaura Lee Jobe, RD   237 mL at 12/26/13 0758  . haloperidol lactate (HALDOL) injection 5 mg  5 mg  Intramuscular Once Elius Etheredge      . hydrOXYzine (ATARAX/VISTARIL) tablet 25 mg  25 mg Oral TID PRN Bevan Vu   25 mg at 12/25/13 0757  . LORazepam (ATIVAN) injection 2 mg  2 mg Intramuscular Once Nichlas Pitera      . lurasidone (LATUDA) tablet 80 mg  80 mg Oral Q supper Zaidin Blyden   80 mg at 12/25/13 1705  . magnesium hydroxide (MILK OF MAGNESIA) suspension 30 mL  30 mL Oral Daily PRN Beau FannyJohn C Withrow, FNP      . nicotine (NICODERM CQ - dosed in mg/24 hours) patch 21 mg  21 mg Transdermal Q0600 Beau FannyJohn C Withrow, FNP   21 mg at 12/25/13 [phone]  . OLANZapine zydis (ZYPREXA) disintegrating tablet 10 mg  10 mg Oral Q8H PRN Viha Kriegel   10 mg at 12/25/13 2001  . traZODone (DESYREL) tablet 150 mg  150 mg Oral QHS Bonny Egger        Lab Results:  No results found for this or any previous visit (from the past 48 hour(s)).  Physical Findings: AIMS: Facial and Oral Movements Muscles of Facial Expression: None, normal Lips and Perioral Area: None, normal Jaw: None, normal Tongue: None, normal,Extremity Movements Upper (arms, wrists, hands, fingers): None, normal Lower (legs, knees, ankles, toes): None, normal, Trunk Movements Neck, shoulders, hips: None, normal, Overall Severity Severity of abnormal movements (highest score from questions above): None, normal Incapacitation due to abnormal movements: None, normal Patient's awareness of abnormal movements (rate only patient's report): No Awareness, Dental Status Current problems with teeth and/or dentures?: No Does patient usually wear dentures?: No  CIWA:  CIWA-Ar Total: 1 COWS:  COWS Total Score: 2  Treatment Plan Summary: Daily contact with patient to assess and evaluate symptoms and progress in treatment Medication management  Plan: Review of chart, vital signs, medications, and notes.  1-Individual and group therapy  2-Medication management for Bipolar disorder: -Continue Tegretol XR 200mg  bid for mood  stabilization. - Increase Latuda  120mg  daily after dinner for mood -Continue Trazodone  150mg  po Qhs for insomnia. -Continue Zyprexa Zydis 10mg  q8h as needed for agitation. - Tegretol level on 12/29/13. 3-Coping skills for depression, anxiety  4-Continue crisis stabilization and management  5-Address health issues--monitoring vital signs, stable  6-Treatment plan in progress to prevent relapse of depression and anxiety   Medical Decision Making Problem Points:  Established problem, improving (1), Review of last therapy session (1) and Review of psycho-social stressors (1) Data Points:  Order Aims Assessment (2) Review of medication regiment & side effects (2) Review of new medications or change in dosage (2)  I certify that inpatient services furnished can reasonably be expected to improve the patient's condition.   Thedore MinsAkintayo, Reshaun Briseno, MD 12/26/2013, 10:32 AM

## 2013-12-26 NOTE — Plan of Care (Signed)
Problem: Alteration in mood Goal: STG-Patient does not harm self or others Outcome: Progressing Pt did not harm self or others this shift.   Goal: STG-Patient is able to sleep at least 6 hours per night Outcome: Progressing Pt has slept for 6 hours this shift.   Goal: STG-Increased ability to follow directions Outcome: Progressing Pt was able to follow directions this shift.

## 2013-12-26 NOTE — Progress Notes (Signed)
Patient ID: Elizabeth [name], female   DOB: 1990-10-20, [age] y.o.   MRN: [id number] D. Patient presents with irritable mood, affect labile. Elizabeth [name] very focused on discharge, becoming irritable this morning when redirected from MD that patient is not pending for discharge today. She later states '' I'm agitated, I'm angry I need some help, I just talked to my ex boyfriend and I can't deal with him. I need some nerve medicines in a shot. I'm going to be celibate. '' Patient very labile and easily agitated, and with poor insight. She did accept injectable medications this morning without issue. Pt refused to complete self inventory today. A. Discussed above information with Dr. Jannifer FranklinAkintayo. Medications given as ordered, including prn injectable medications for agitation (see eMAR) . Support and encouragement provided. R. Patient is currently in her room, in no acute distress at this time. Will continue to monitor q 15 minutes for safety.

## 2013-12-26 NOTE — [facility] Group Notes (Signed)
[facility] LCSW Group Therapy  12/26/2013 2:20 PM  Type of Therapy:  Group Therapy  Participation Level:  Active  Participation Quality:  Attentive  Affect:  Excited  Cognitive:  Oriented  Insight:  Improving  Engagement in Therapy:  Improving  Modes of Intervention:  Confrontation, Discussion, Education, Exploration, Limit-setting, Problem-solving, Rapport Building, Socialization and Support  Summary of Progress/Problems: Feelings around Relapse. Group members discussed the meaning of relapse and shared personal stories of relapse, how it affected them and others, and how they perceived themselves during this time. Group members were encouraged to identify triggers, warning signs and coping skills used when facing the possibility of relapse. Social supports were discussed and explored in detail. Post Acute Withdrawal Syndrome (handout provided) was introduced and examined. Pt's were encouraged to ask questions, talk about key points associated with PAWS, and process this information in terms of relapse prevention. Lacreasha was attentive and engaged throughout today's therapy group. She shared that she was happy to learn about PAWS and feels that "2 years is not a long time if I am serious about getting better." Miku shows progress in the group setting and improving insight AEB her ability to actively participate in group discussion and identify self care ideas that she plans to work on during recovery: "I need to get up every morning, brush my teeth, go out side for a walk, and go to meetings. Meeting people in recovery and building my support network is really important to me."    Smart, Jorgina Binning LCSWA  12/26/2013, 2:20 PM

## 2013-12-26 NOTE — Progress Notes (Signed)
Adult Psychoeducational Group Note  Date:  12/26/2013 Time:  9:52 PM  Group Topic/Focus:  Wrap-Up Group:   The focus of this group is to help patients review their daily goal of treatment and discuss progress on daily workbooks.  Participation Level:  Did Not Attend  Participation Quality:  Drowsy  Affect:  Flat  Cognitive:  Disorganized  Insight: Improving  Engagement in Group:  None  Modes of Intervention:  none  Additional Comments:  Pt did not come to group pt decide to lay down until snack time... At snack time Pt wanted to talk about what was said during group   Baird Polinski R 12/26/2013, 9:52 PM

## 2013-12-26 NOTE — Progress Notes (Signed)
Patient ID: Elizabeth [name], female   DOB: [birth year], 23 y.o.   MRN: [id number] D: Patient denies A/VH and suicidal thoughts. Pt mood and affect is fidgety but pleasant. Pt reports feeling tired from injection given earlier today. Pt reports she likes the injection because it calm her down fast.  Cooperative with assessment. No acute distressed noted at this time.   A: Met with pt 1:1. Medications administered as prescribed. Writer encouraged pt to discuss feelings and continue taking meds. Pt encouraged to come to staff with any questions or concerns.   R: Patient is safe on the unit. Continue current POC.    p

## 2013-12-27 NOTE — [facility] Group Notes (Signed)
[facility] Group Notes:  (Nursing/MHT/Case Management/Adjunct)  Date:  12/27/2013  Time:  3:31 PM  Type of Therapy:  Psychoeducational Skills  Participation Level:  Active  Participation Quality:  Appropriate  Affect:  Appropriate  Cognitive:  Appropriate  Insight:  Appropriate  Engagement in Group:  Engaged  Modes of Intervention:  Discussion  Summary of Progress/Problems: Pt did attend healthy coping skills, pt reported that she was negative SI/HI, no AH/VH noted. Pt rated her depression as a 4, and her helplessness/hopelessness as a 1.     Pt reported no issues or concerns.   Jacquelyne BalintForrest, Jamichael Knotts Shanta 12/27/2013, 3:31 PM

## 2013-12-27 NOTE — [facility] Group Notes (Signed)
[facility] Group Notes:  (Clinical Social Work)  12/27/2013   11:15am-12:00pm  Summary of Progress/Problems:  The main focus of today's process group was to listen to a variety of genres of music and to identify that different types of music provoke different responses.  The patient then was able to identify personally what was soothing for them, as well as energizing.    The patient expressed understanding of how music can be used at home as a wellness/recovery tool.  She chose country music.  She talked during most of group.  Type of Therapy:  Music Therapy   Participation Level:  Active  Participation Quality:  Attentive and Sharing  Affect:  Blunted  Cognitive:  Oriented  Insight:  Engaged  Engagement in Therapy:  Engaged  Modes of Intervention:   Activity, Exploration  Ambrose MantleMareida Grossman-Orr, LCSW 12/27/2013, 11:47 AM

## 2013-12-27 NOTE — Progress Notes (Signed)
Patient ID: Elizabeth [name], female   DOB: 1990-06-28, 23 y.o.   MRN: [id number]   [facility] MD Progress Note  12/27/2013 11:01 AM Elizabeth [name]  MRN:  [id number]  Subjective: Patient states  "I have an attitute dont want to answer too many questions"  Objective: Patient seen and her chart was reviewed. Patient reports mood lability, getting overwhelmed, emotional outburst, crying episodes, anxiety and  racing thoughts.Patient gave understanding that drugs may contribute to her attitude and is showing some better judjement. Still remains labile but not isolating . No hallucinations or hopelessness.  She has not verbalized any adverse reactions.  Diagnosis:   AXIS I: Bipolar I disorder, most recent episode (or current) manic           Cocaine use disorder           Marijuana use disorder           History of Polysubstance abuse. AXIS II: Cluster B Traits  AXIS III:  Past Medical History   Diagnosis  Date   .  Bipolar 1 disorder     AXIS IV: other psychosocial or environmental problems and problems related to social environment  AXIS V: 40-50   ADL's:  Intact  Sleep: fair  Appetite:  Fair  Suicidal Ideation: denies Plan:  denies Intent:  denies Means:  denies Homicidal Ideation: denies Plan:  denies Intent:  denies Means:  denies AEB (as evidenced by):  Psychiatric Specialty Exam: Review of Systems  Constitutional: Negative.   HENT: Negative.   Eyes: Negative.   Respiratory: Negative.   Cardiovascular: Negative.   Gastrointestinal: Negative.   Genitourinary: Negative.   Musculoskeletal: Negative.   Skin: Negative.   Neurological: Negative.   Psychiatric/Behavioral: Positive for depression and substance abuse. The patient is nervous/anxious.        Labile mood, agitation    Blood pressure 127/73, pulse 99, temperature 98 F (36.7 C), temperature source Oral, resp. rate 16, height 5\' 5"  (1.651 m), weight 165 lb (74.844 kg), last menstrual period 11/03/2013, SpO2  100.00%.Body mass index is 27.46 kg/(m^2).   General Appearance: Fairly Groomed   Patent attorney:: Good   Speech: Pressured   Volume: Increased   Mood: Irritable   Affect: Labile and Full Range   Thought Process: Circumstantial   Orientation: Full (Time, Place, and Person)   Thought Content: Hallucinations: Auditory  Visual and Paranoid Ideation   Suicidal Thoughts: No   Homicidal Thoughts: No   Memory: Immediate; Fair  Recent; Fair  Remote; Fair   Judgement: Impaired   Insight: Lacking   Psychomotor Activity: Increased   Concentration: Fair   Recall: [employer] of Knowledge:Fair   Language: Good   Akathisia: No   Handed: Right   AIMS (if indicated):   Assets: Communication Skills  Desire for Improvement  Physical Health   Sleep: Number of Hours: 4     Musculoskeletal:  Strength & Muscle Tone: within normal limits  Gait & Station: normal  Patient leans: N/A   Current Medications: Current Facility-Administered Medications  Medication Dose Route Frequency Provider Last Rate Last Dose  . acetaminophen (TYLENOL) tablet 650 mg  650 mg Oral Q6H PRN Beau Fanny, FNP   650 mg at 12/26/13 1146  . alum & mag hydroxide-simeth (MAALOX/MYLANTA) 200-200-20 MG/5ML suspension 30 mL  30 mL Oral Q4H PRN Beau Fanny, FNP      . benztropine (COGENTIN) tablet 1 mg  1 mg Oral Q supper Mojeed Akintayo  1 mg at 12/26/13 1648  . carbamazepine (TEGRETOL XR) 12 hr tablet 200 mg  200 mg Oral BID Mojeed Akintayo   200 mg at 12/27/13 0744  . feeding supplement (ENSURE COMPLETE) (ENSURE COMPLETE) liquid 237 mL  237 mL Oral BID BM Jeoffrey MassedLaura Lee Jobe, RD   237 mL at 12/27/13 0741  . hydrOXYzine (ATARAX/VISTARIL) tablet 25 mg  25 mg Oral TID PRN Mojeed Akintayo   25 mg at 12/26/13 2109  . lurasidone (LATUDA) tablet 120 mg  120 mg Oral Q supper Mojeed Akintayo   120 mg at 12/26/13 1645  . magnesium hydroxide (MILK OF MAGNESIA) suspension 30 mL  30 mL Oral Daily PRN Beau FannyJohn C Withrow, FNP      . nicotine  (NICODERM CQ - dosed in mg/24 hours) patch 21 mg  21 mg Transdermal Q0600 Beau FannyJohn C Withrow, FNP   21 mg at 12/25/13 [phone]  . OLANZapine zydis (ZYPREXA) disintegrating tablet 10 mg  10 mg Oral Q8H PRN Mojeed Akintayo   10 mg at 12/27/13 0741  . traZODone (DESYREL) tablet 150 mg  150 mg Oral QHS Mojeed Akintayo        Lab Results:  No results found for this or any previous visit (from the past 48 hour(s)).  Physical Findings: AIMS: Facial and Oral Movements Muscles of Facial Expression: None, normal Lips and Perioral Area: None, normal Jaw: None, normal Tongue: None, normal,Extremity Movements Upper (arms, wrists, hands, fingers): None, normal Lower (legs, knees, ankles, toes): None, normal, Trunk Movements Neck, shoulders, hips: None, normal, Overall Severity Severity of abnormal movements (highest score from questions above): None, normal Incapacitation due to abnormal movements: None, normal Patient's awareness of abnormal movements (rate only patient's report): No Awareness, Dental Status Current problems with teeth and/or dentures?: No Does patient usually wear dentures?: No  CIWA:  CIWA-Ar Total: 1 COWS:  COWS Total Score: 2  Treatment Plan Summary: Daily contact with patient to assess and evaluate symptoms and progress in treatment Medication management  Plan: Review of chart, vital signs, medications, and notes.  1-Individual and group therapy  2-Medication management for Bipolar disorder: -Continue Tegretol XR 200mg  bid for mood stabilization.  Latuda  120mg  daily after dinner for mood -Continue Trazodone  150mg  po Qhs for insomnia. -Continue Zyprexa Zydis 10mg  q8h as needed for agitation. - Tegretol level on 12/29/13. 3-Coping skills for depression, anxiety  4-Continue crisis stabilization and management  5-Address health issues--monitoring vital signs, stable  6-Treatment plan in progress to prevent relapse of depression and anxiety   Medical Decision Making Problem  Points:  Established problem, improving (1), Review of last therapy session (1) and Review of psycho-social stressors (1) Data Points:  Order Aims Assessment (2) Review of medication regiment & side effects (2) Review of new medications or change in dosage (2)  I certify that inpatient services furnished can reasonably be expected to improve the patient's condition.   Thresa RossAKHTAR, Melinna Linarez, MD 12/27/2013, 11:01 AM

## 2013-12-27 NOTE — [facility] Group Notes (Signed)
[facility] Group Notes:  (Nursing/MHT/Case Management/Adjunct)  Date:  12/27/2013  Time:  11:11 AM  Type of Therapy:  Psychoeducational Skills  Participation Level:  Active  Participation Quality:  Appropriate  Affect:  Appropriate  Cognitive:  Appropriate  Insight:  Appropriate  Engagement in Group:  Engaged  Modes of Intervention:  Discussion  Summary of Progress/Problems: Pt did attend self inventory group.     Pt reported no issues or concerns.   Jacquelyne BalintForrest, Josecarlos Harriott Shanta 12/27/2013, 11:11 AM

## 2013-12-27 NOTE — Progress Notes (Signed)
The focus of this group is to help patients review their daily goal of treatment and discuss progress on daily workbooks. Pt attended the evening group session and responded to all discussion prompts from the Writer. Pt shared that today was a good day on the unit, the highlight of which were visits from her parents during meal time. Pt's only additional request from Nursing Staff this evening was for towels, which were given to her following group. Pt did become visibly agitated at an intrusive peer during group who continually interrupted her, but Elizabeth [name] maintained her composure and exhibited self-control. Pt's affect was otherwise appropriate.

## 2013-12-27 NOTE — Progress Notes (Addendum)
Patient ID: Elizabeth [name], female   DOB: 07-12-1990, 23 y.o.   MRN: [id number] D. Patient presents with irritable mood, affect labile again today. [name] reports that she is doing well, and states '' I am fine. I slept good. I like that medication I had yesterday, it made me feel relaxed. But when I leave here, I'm going [facility] so I need to learn all my medications. '' Patient continues to be easily agitated, as when programming started pt cursing stating '' what the fuck people act like they don't know. I told them I'm supposed to go to the other hall! '' She has been able to be verbally redirected and is cooperative at this time. A. Discussed above information with Dr. Gilmore LarocheAkhtar. Medications given as ordered, including prn medications for agitation (see eMAR) . Support and encouragement provided. R. Patient is currently in her room, in no acute distress at this time. Will continue to monitor q 15 minutes for safety.

## 2013-12-27 NOTE — [facility] Group Notes (Addendum)
[facility] Group Notes:  (Clinical Social Work) 300 hall group  12/27/2013     10-11AM  Summary of Progress/Problems:   The main focus of today's process group was for the patient to identify ways in which they have in the past sabotaged their own recovery. Motivational Interviewing and a worksheet were utilized to help patients explore in depth the perceived benefits and costs of their substance use, as well as the potential benefits and costs of stopping.  The Stages of Change were explained using a handout, with an emphasis on making plans to deal with sabotaging behaviors proactively.  The patient stated at the beginning of group that her name is "Elizabeth [name], Elizabeth [name]."  She quickly became irritable during group, stating that the topic did not impact her.  She finally got up and seemed irritable as she left the room.  Type of Therapy:  Group Therapy - Process   Participation Level:  Minimal  Participation Quality:  Intrusive and Resistant  Affect:  Blunted  Cognitive:  Disorganized  Insight:  Lacking  Engagement in Therapy:  Limited  Modes of Intervention:  Education, Support and Processing, Motivational Interviewing  Ambrose MantleMareida Grossman-Orr, LCSW 12/27/2013, 11:42 AM

## 2013-12-28 NOTE — Progress Notes (Signed)
Patient is up and visible in the milieu this evening. While she is pleasant, her interactions are at times brief and superficial. When she learned that trazadone was the only scheduled med, she said, "no I don't want that. It causes me nightmares." She was agreeable to taking vistaril prn which was given without difficulty. Pt offered support and reassurance. On reassess, she was asleep. She denied AVH though appears to be experiencing internal stimuli. Denies SI/HI. No pain or physical problems reported. Lawrence MarseillesFriedman, Nioka Thorington Eakes

## 2013-12-28 NOTE — [facility] Group Notes (Signed)
[facility] Group Notes:  (Nursing/MHT/Case Management/Adjunct)  Date:  12/28/2013  Time:  9:00AM  Type of Therapy:  Self Inventory  Participation Level:  Active  Participation Quality:  Appropriate  Affect:  Appropriate  Cognitive:  Alert  Insight:  Improving  Engagement in Group:  Engaged  Modes of Intervention:  Discussion  Summary of Progress/Problems:  Kellie Moorerry, Cloie Wooden M 12/28/2013, 12:25 PM

## 2013-12-28 NOTE — Progress Notes (Signed)
Patient ID: Elizabeth [name], female   DOB: 1991-04-09, 23 y.o.   MRN: [id number]   [facility] MD Progress Note  12/28/2013 11:22 AM VINCENZINA JAGODA  MRN:  [id number]  Subjective: Patient states  "I have an attitute dont want to answer too many questions" Says i get anxious when i talk to you all. At the end wanted to have vistaril.   Objective: Patient seen and her chart was reviewed. Patient reports mood lability, getting overwhelmed, emotional outburst, crying episodes, anxiety and  racing thoughts.Patient gave understanding that drugs may contribute to her attitude and is showing some better judjement. Still remains labile but not isolating . No hallucinations or hopelessness.  She has not verbalized any adverse reactions. Says she needs to keep away from places or stress to avoid using drugs. Shows insight about avoiding drugs but does get guarded during interview.   Diagnosis:   AXIS I: Bipolar I disorder, most recent episode (or current) manic           Cocaine use disorder           Marijuana use disorder           History of Polysubstance abuse. AXIS II: Cluster B Traits  AXIS III:  Past Medical History   Diagnosis  Date   .  Bipolar 1 disorder     AXIS IV: other psychosocial or environmental problems and problems related to social environment  AXIS V: 40-50   ADL's:  Intact  Sleep: fair  Appetite:  Fair  Suicidal Ideation: denies Plan:  denies Intent:  denies Means:  denies Homicidal Ideation: denies Plan:  denies Intent:  denies Means:  denies AEB (as evidenced by):  Psychiatric Specialty Exam: Review of Systems  Constitutional: Negative.   HENT: Negative.   Eyes: Negative.   Respiratory: Negative.   Cardiovascular: Negative.   Gastrointestinal: Negative.   Genitourinary: Negative.   Musculoskeletal: Negative.   Skin: Negative.   Neurological: Negative.   Psychiatric/Behavioral: Positive for depression and substance abuse. The patient is nervous/anxious.        Labile mood, agitation    Blood pressure 113/71, pulse 100, temperature 98.6 F (37 C), temperature source Oral, resp. rate 16, height 5\' 5"  (1.651 m), weight 165 lb (74.844 kg), last menstrual period 11/03/2013, SpO2 100.00%.Body mass index is 27.46 kg/(m^2).   General Appearance: Fairly Groomed   Patent attorney:: Good   Speech: Pressured   Volume: Increased   Mood: Irritable   Affect: Labile and Full Range   Thought Process: Circumstantial   Orientation: Full (Time, Place, and Person)   Thought Content: Hallucinations: Auditory  Visual and Paranoid Ideation   Suicidal Thoughts: No   Homicidal Thoughts: No   Memory: Immediate; Fair  Recent; Fair  Remote; Fair   Judgement: Impaired   Insight: Lacking   Psychomotor Activity: Increased   Concentration: Fair   Recall: [employer] of Knowledge:Fair   Language: Good   Akathisia: No   Handed: Right   AIMS (if indicated):   Assets: Communication Skills  Desire for Improvement  Physical Health   Sleep: Number of Hours: 4     Musculoskeletal:  Strength & Muscle Tone: within normal limits  Gait & Station: normal  Patient leans: N/A   Current Medications: Current Facility-Administered Medications  Medication Dose Route Frequency Provider Last Rate Last Dose  . acetaminophen (TYLENOL) tablet 650 mg  650 mg Oral Q6H PRN Beau Fanny, FNP   650 mg at 12/26/13  1146  . alum & mag hydroxide-simeth (MAALOX/MYLANTA) 200-200-20 MG/5ML suspension 30 mL  30 mL Oral Q4H PRN Beau FannyJohn C Withrow, FNP      . benztropine (COGENTIN) tablet 1 mg  1 mg Oral Q supper Mojeed Akintayo   1 mg at 12/27/13 1701  . carbamazepine (TEGRETOL XR) 12 hr tablet 200 mg  200 mg Oral BID Mojeed Akintayo   200 mg at 12/28/13 [id number]  . feeding supplement (ENSURE COMPLETE) (ENSURE COMPLETE) liquid 237 mL  237 mL Oral BID BM Jeoffrey MassedLaura Lee Jobe, RD   237 mL at 12/28/13 0807  . hydrOXYzine (ATARAX/VISTARIL) tablet 25 mg  25 mg Oral TID PRN Mojeed Akintayo   25 mg at 12/28/13  1037  . lurasidone (LATUDA) tablet 120 mg  120 mg Oral Q supper Mojeed Akintayo   120 mg at 12/27/13 1701  . magnesium hydroxide (MILK OF MAGNESIA) suspension 30 mL  30 mL Oral Daily PRN Beau FannyJohn C Withrow, FNP      . nicotine (NICODERM CQ - dosed in mg/24 hours) patch 21 mg  21 mg Transdermal Q0600 Beau FannyJohn C Withrow, FNP   21 mg at 12/25/13 [phone]  . OLANZapine zydis (ZYPREXA) disintegrating tablet 10 mg  10 mg Oral Q8H PRN Mojeed Akintayo   10 mg at 12/28/13 [id number]  . traZODone (DESYREL) tablet 150 mg  150 mg Oral QHS Mojeed Akintayo        Lab Results:  No results found for this or any previous visit (from the past 48 hour(s)).  Physical Findings: AIMS: Facial and Oral Movements Muscles of Facial Expression: None, normal Lips and Perioral Area: None, normal Jaw: None, normal Tongue: None, normal,Extremity Movements Upper (arms, wrists, hands, fingers): None, normal Lower (legs, knees, ankles, toes): None, normal, Trunk Movements Neck, shoulders, hips: None, normal, Overall Severity Severity of abnormal movements (highest score from questions above): None, normal Incapacitation due to abnormal movements: None, normal Patient's awareness of abnormal movements (rate only patient's report): No Awareness, Dental Status Current problems with teeth and/or dentures?: No Does patient usually wear dentures?: No  CIWA:  CIWA-Ar Total: 1 COWS:  COWS Total Score: 2  Treatment Plan Summary: Daily contact with patient to assess and evaluate symptoms and progress in treatment Medication management  Plan: Review of chart, vital signs, medications, and notes.  1-Individual and group therapy  2-Medication management for Bipolar disorder: -Continue Tegretol XR 200mg  bid for mood stabilization.  Latuda  120mg  daily after dinner for mood -Continue Trazodone  150mg  po Qhs for insomnia. -Continue Zyprexa Zydis 10mg  q8h as needed for agitation. - Tegretol level on 12/29/13. 3-Coping skills for depression,  anxiety  4-Continue crisis stabilization and management  5-Address health issues--monitoring vital signs, stable  6-Treatment plan in progress to prevent relapse of depression and anxiety Slow improvement in mood and insight.   Medical Decision Making Problem Points:  Established problem, improving (1), Review of last therapy session (1) and Review of psycho-social stressors (1) Data Points:  Order Aims Assessment (2) Review of medication regiment & side effects (2) Review of new medications or change in dosage (2)  I certify that inpatient services furnished can reasonably be expected to improve the patient's condition.   Thresa RossAKHTAR, Baby Stairs, MD 12/28/2013, 11:22 AM

## 2013-12-28 NOTE — [facility] Group Notes (Signed)
[facility] LCSW Group Therapy  12/28/2013 3:43 PM  Type of Therapy:  Group Therapy  Participation Level:  Active  Participation Quality:  Appropriate, Attentive and Sharing  Affect:  Appropriate  Cognitive:  Oriented  Insight:  Developing/Improving  Engagement in Therapy:  Engaged  Modes of Intervention:  Discussion, Education, Exploration and Socialization  Summary of Progress/Problems: Pt was able to partipate in discussion about self-sabotaging behaviors.  Pt was able to give an example of a behavior as well as identify an effective coping skill.  Pt was supportive of others sharing in group.    Seabron SpatesVaughn, Jessy Calixte Anne 12/28/2013, 3:43 PM

## 2013-12-28 NOTE — Plan of Care (Signed)
Problem: Ineffective individual coping Goal: STG: Patient will remain free from self harm Outcome: Progressing Patient denies SI and has refrained from any self harm while on the unit.  Problem: Alteration in mood Goal: STG-Patient is able to attend at least 2 groups per day Outcome: Not Progressing Patient attended all unit programming today and yesterday however is disruptive and distracting at times to peers and those facilitating the groups.

## 2013-12-28 NOTE — Progress Notes (Signed)
Patient ID: Elby BeckBrittney L Seaman, female   DOB: 02/14/1991, 23 y.o.   MRN: [id number] D. Adelyna presents with irritable mood, affect labile again today. Loreen reports that she is doing well, however she continues to endorse '' feeling moody and mood swings '' Patient remains easily agitated and labile at times. She started cursing in the dayroom while watching television stating ''that is my problem. That's why I'm bipolar, I watch too much reality tv. That's what made me bipolar '' She has limited insight. She endorses feeling easily agitated and states ''yeah I can go from 0 to 100 and go off on somebody quick. '' A. Discussed above information with Dr. Gilmore LarocheAkhtar. Medications given as ordered, including prn medications for agitation , per patient request. Support and encouragement provided. R. Patient is currently in her room, in no acute distress at this time. Will continue to monitor q 15 minutes for safety.

## 2013-12-28 NOTE — Progress Notes (Signed)
Adult Psychoeducational Group Note  Date:  12/28/2013 Time:  8:46 PM  Group Topic/Focus:  Wrap-Up Group:   The focus of this group is to help patients review their daily goal of treatment and discuss progress on daily workbooks.  Participation Level:  Active  Participation Quality:  Appropriate  Affect:  Appropriate  Cognitive:  Appropriate  Insight: Appropriate  Engagement in Group:  Engaged  Modes of Intervention:  Discussion  Additional Comments:The patient expressed that she feels she is ready to discharge.The patient also said that she is going to another facility tomorrow.  Octavio Mannshigpen, Adriahna Shearman Lee 12/28/2013, 8:46 PM

## 2013-12-28 NOTE — [facility] Group Notes (Signed)
[facility] Group Notes:  (Nursing/MHT/Case Management/Adjunct)  Date:  12/28/2013  Time:  9:10AM  Type of Therapy:  Psychoeducational Skills  Participation Level:  Active  Participation Quality:  Appropriate  Affect:  Appropriate  Cognitive:  Alert  Insight:  Good  Engagement in Group:  Engaged  Modes of Intervention:  Discussion  Summary of Progress/Problems: pt engaging in group. Staying on topic and appropriate conversation  Kellie Moorerry, Charday Capetillo M 12/28/2013, 12:26 PM

## 2013-12-29 LAB — CARBAMAZEPINE LEVEL, TOTAL: Carbamazepine Lvl: 8.2 ug/mL (ref 4.0–12.0)

## 2013-12-29 MED ORDER — LURASIDONE HCL 120 MG PO TABS: 120.0000 mg | ORAL_TABLET | Freq: Every day | ORAL | Status: DC

## 2013-12-29 MED ORDER — CARBAMAZEPINE ER 200 MG PO TB12: 200.0000 mg | ORAL_TABLET | Freq: Two times a day (BID) | ORAL | Status: DC

## 2013-12-29 MED ORDER — BENZTROPINE MESYLATE 1 MG PO TABS: 1.0000 mg | ORAL_TABLET | Freq: Every day | ORAL | Status: DC

## 2013-12-29 MED ORDER — TRAZODONE HCL 150 MG PO TABS: 150.0000 mg | ORAL_TABLET | Freq: Every day | ORAL | Status: DC

## 2013-12-29 MED ORDER — VITAMIN B-12 1000 MCG PO TABS: 2000.0000 ug | ORAL_TABLET | Freq: Every day | ORAL | Status: DC

## 2013-12-29 NOTE — [facility] Suicide Risk Assessment (Signed)
   Demographic Factors:  Low socioeconomic status, Unemployed and female  Total Time spent with patient: 20 minutes  Psychiatric Specialty Exam: Physical Exam  Psychiatric: She has a normal mood and affect. Her speech is normal and behavior is normal. Judgment and thought content normal. Cognition and memory are normal.    Review of Systems  Constitutional: Negative.   HENT: Negative.   Eyes: Negative.   Respiratory: Negative.   Cardiovascular: Negative.   Gastrointestinal: Negative.   Genitourinary: Negative.   Musculoskeletal: Negative.   Skin: Negative.   Neurological: Negative.   Endo/Heme/Allergies: Negative.   Psychiatric/Behavioral: Negative.     Blood pressure 113/65, pulse 94, temperature 98.8 F (37.1 C), temperature source Oral, resp. rate 16, height 5\' 5"  (1.651 m), weight 74.844 kg (165 lb), last menstrual period 11/03/2013, SpO2 100.00%.Body mass index is 27.46 kg/(m^2).  General Appearance: Fairly Groomed  Patent attorneyye Contact::  Good  Speech:  Clear and Coherent  Volume:  Normal  Mood:  Euthymic  Affect:  Appropriate  Thought Process:  Goal Directed  Orientation:  Full (Time, Place, and Person)  Thought Content:  Negative  Suicidal Thoughts:  No  Homicidal Thoughts:  No  Memory:  Immediate;   Fair Recent;   Fair Remote;   Fair  Judgement:  Fair  Insight:  Fair  Psychomotor Activity:  Normal  Concentration:  Fair  Recall:  FiservFair  Fund of Knowledge:Fair  Language: Fair  Akathisia:  No  Handed:  Right  AIMS (if indicated):     Assets:  Communication Skills Desire for Improvement Physical Health  Sleep:  Number of Hours: 5.25    Musculoskeletal: Strength & Muscle Tone: within normal limits Gait & Station: normal Patient leans: N/A   Mental Status Per Nursing Assessment::   On Admission:  NA  Current Mental Status by Physician: patient denies suicidal ideation, intent or plan  Loss Factors: NA  Historical Factors: poor impulse control  Risk  Reduction Factors:   Sense of responsibility to family and Positive social support  Continued Clinical Symptoms:  Alcohol/Substance Abuse/Dependencies  Cognitive Features That Contribute To Risk:  Closed-mindedness    Suicide Risk:  Minimal: No identifiable suicidal ideation.  Patients presenting with no risk factors but with morbid ruminations; may be classified as minimal risk based on the severity of the depressive symptoms  Discharge Diagnoses:   AXIS I:  Bipolar I disorder, most recent episode (or current) manic              Cocaine use disorder. Cannabis use disorder.              History of Poly substance abuse AXIS II:  Cluster B Traits AXIS III:   Past Medical History  Diagnosis Date  . None reported    AXIS IV:  other psychosocial or environmental problems and problems related to social environment AXIS V:  61-70 mild symptoms  Plan Of Care/Follow-up recommendations:  Activity:  as tolerated Diet:  healthy Tests:  Tegretol Level: 8.2 Other:  patient to keep her after care appointment  Is patient on multiple antipsychotic therapies at discharge:  No   Has Patient had three or more failed trials of antipsychotic monotherapy by history:  No  Recommended Plan for Multiple Antipsychotic Therapies: NA    Thedore MinsAkintayo, Dalyn Becker, MD 12/29/2013, 10:13 AM

## 2013-12-29 NOTE — Discharge Summary (Signed)
Physician Discharge Summary Note  Patient:  Elizabeth [name] is an 23 y.o., female MRN:  [id number] DOB:  28-May-1991 Patient phone:  [phone] (home)  Patient address:   [address] [place] [address],  Total Time spent with patient: 20 minutes  Date of Admission:  12/21/2013 Date of Discharge: 12/29/13  Reason for Admission:  Acute mania  Discharge Diagnoses: Principal Problem:   Bipolar I disorder, most recent episode (or current) manic Active Problems:   Cocaine abuse with cocaine-induced mood disorder   Marijuana abuse   MDD (major depressive disorder)   Psychiatric Specialty Exam: Physical Exam  Review of Systems  Constitutional: Negative.   HENT: Negative.   Eyes: Negative.   Respiratory: Negative.   Cardiovascular: Negative.   Gastrointestinal: Negative.   Genitourinary: Negative.   Musculoskeletal: Negative.   Skin: Negative.   Neurological: Negative.   Endo/Heme/Allergies: Negative.   Psychiatric/Behavioral: Negative.     Blood pressure 113/65, pulse 94, temperature 98.8 F (37.1 C), temperature source Oral, resp. rate 16, height 5\' 5"  (1.651 m), weight 74.844 kg (165 lb), last menstrual period 11/03/2013, SpO2 100.00%.Body mass index is 27.46 kg/(m^2).  See Physician SRA                                                  Past Psychiatric History: See H&P Diagnosis:  Hospitalizations:  Outpatient Care:  Substance Abuse Care:  Self-Mutilation:  Suicidal Attempts:  Violent Behaviors:   Musculoskeletal: Strength & Muscle Tone: within normal limits Gait & Station: normal Patient leans: N/A  DSM5:  AXIS I: Bipolar I disorder, most recent episode (or current) manic  Cocaine use disorder. Cannabis use disorder.  History of Poly substance abuse  AXIS II: Cluster B Traits  AXIS III:  Past Medical History   Diagnosis  Date   .  None reported    AXIS IV: other psychosocial or environmental problems and problems  related to social environment  AXIS V: 61-70 mild symptoms  Level of Care:  OP  Hospital Course:  Elizabeth [name] is a 23 year old single woman with history of Bipolar disorder and polysubstance abuse who was admitted due to escalating manic symptoms. Patient reports that she missed her medication for a couple of times and started smoking marijuana and doing powdered cocaine. As a result, she became manic, became disorganized, not sleeping for days, getting irritable, showing poor attitude, having racing thought and poor concentration. Patient also reports hearing voices and seeing ghosts. Patient has become grandiose- went to an attorney to enquire about starting a fortune [employer] company. Her urine toxicology is positive for cocaine and Marijuana.         Elizabeth [name] was admitted to the adult 400 unit where she was evaluated and her symptoms were identified. Medication management was discussed and implemented. Patient was started on Tegretol XR 200 mg BID for improved mood stability, Cogentin 1 mg daily for EPS prevention, and her Latuda was increased to 120 mg for manic symptoms.  She was encouraged to participate in unit programming. Medical problems were identified and treated appropriately. Home medication was restarted as needed.  She was evaluated each day by a clinical provider to ascertain the patient's response to treatment.  Improvement was noted by the patient's report of decreasing symptoms, improved sleep and appetite, affect, medication tolerance, behavior,  and participation in unit programming.  The patient was asked each day to complete a self inventory noting mood, mental status, pain, new symptoms, anxiety and concerns.         She responded well to medication and being in a therapeutic and supportive environment. Positive and appropriate behavior was noted and the patient was motivated for recovery. Patient requested help for her substance abuse issues. Her urine drug screen was positive  for cocaine and marijuana.  She worked closely with the treatment team and case manager to develop a discharge plan with appropriate goals. Coping skills, problem solving as well as relaxation therapies were also part of the unit programming.         By the day of discharge she was in much improved condition than upon admission.  Symptoms were reported as significantly decreased or resolved completely.  The patient denied SI/HI and voiced no AVH. She was motivated to continue taking medication with a goal of continued improvement in mental health. Elizabeth [name] was discharged home with a plan to follow up as noted below.  Consults:  psychiatry  Significant Diagnostic Studies:  Chemistry panel, CBC, Tegretol level, UA, UDS positive for cocaine and marijuana    Discharge Vitals:   Blood pressure 113/65, pulse 94, temperature 98.8 F (37.1 C), temperature source Oral, resp. rate 16, height 5\' 5"  (1.651 m), weight 74.844 kg (165 lb), last menstrual period 11/03/2013, SpO2 100.00%. Body mass index is 27.46 kg/(m^2). Lab Results:   Results for orders placed during the hospital encounter of 12/21/13 (from the past 72 hour(s))  CARBAMAZEPINE LEVEL, TOTAL     Status: None   Collection Time    12/29/13  6:07 AM      Result Value Ref Range   Carbamazepine Lvl 8.2  4.0 - 12.0 ug/mL   Comment: Performed at [facility]    Physical Findings: AIMS: Facial and Oral Movements Muscles of Facial Expression: None, normal Lips and Perioral Area: None, normal Jaw: None, normal Tongue: None, normal,Extremity Movements Upper (arms, wrists, hands, fingers): None, normal Lower (legs, knees, ankles, toes): None, normal, Trunk Movements Neck, shoulders, hips: None, normal, Overall Severity Severity of abnormal movements (highest score from questions above): None, normal Incapacitation due to abnormal movements: None, normal Patient's awareness of abnormal movements (rate only patient's report): No  Awareness, Dental Status Current problems with teeth and/or dentures?: No Does patient usually wear dentures?: No  CIWA:  CIWA-Ar Total: 1 COWS:  COWS Total Score: 2  Psychiatric Specialty Exam: See Psychiatric Specialty Exam and Suicide Risk Assessment completed by Attending Physician prior to discharge.  Discharge destination:  Home  Is patient on multiple antipsychotic therapies at discharge:  No   Has Patient had three or more failed trials of antipsychotic monotherapy by history:  No  Recommended Plan for Multiple Antipsychotic Therapies: NA     Medication List       Indication   albuterol 108 (90 BASE) MCG/ACT inhaler  Commonly known as:  PROVENTIL HFA;VENTOLIN HFA  Inhale 2 puffs into the lungs every 6 (six) hours as needed for wheezing or shortness of breath.      benztropine 1 MG tablet  Commonly known as:  COGENTIN  Take 1 tablet (1 mg total) by mouth daily with supper.   Indication:  Extrapyramidal Reaction caused by Medications     carbamazepine 200 MG 12 hr tablet  Commonly known as:  TEGRETOL XR  Take 1 tablet (200 mg total) by mouth 2 (  two) times daily.   Indication:  Manic-Depression     Lurasidone HCl 120 MG Tabs  Take 1 tablet (120 mg total) by mouth daily with supper.   Indication:  Depressive Phase of Manic-Depression     traZODone 150 MG tablet  Commonly known as:  DESYREL  Take 1 tablet (150 mg total) by mouth at bedtime.   Indication:  Trouble Sleeping     vitamin B-12 1000 MCG tablet  Commonly known as:  CYANOCOBALAMIN  Take 2 tablets (2,000 mcg total) by mouth daily.   Indication:  Inadequate Vitamin B12           Follow-up Information   Follow up with [facility] [place] On 12/29/2013. (They will pick you up at 11:00.)    Contact information:   [address]  [place]  [phone]      Follow-up recommendations:   Activity: as tolerated  Diet: healthy  Tests: Tegretol Level: 8.2  Other: patient to keep her after care  appointment   Comments:   Take all your medications as prescribed by your mental healthcare provider.  Report any adverse effects and or reactions from your medicines to your outpatient provider promptly.  Patient is instructed and cautioned to not engage in alcohol and or illegal drug use while on prescription medicines.  In the event of worsening symptoms, patient is instructed to call the crisis hotline, 911 and or go to the nearest ED for appropriate evaluation and treatment of symptoms.  Follow-up with your primary care provider for your other medical issues, concerns and or health care needs.   Total Discharge Time:  Greater than 30 minutes.  SignedFransisca Kaufmann: DAVIS, LAURA NP-C 12/29/2013, 9:30 AM  Patient seen, evaluated and I agree with notes by Nurse Practitioner. Thedore MinsMojeed Rhyder Bratz, MD

## 2013-12-29 NOTE — Tx Team (Signed)
  Interdisciplinary Treatment Plan Update   Date Reviewed:  12/29/2013  Time Reviewed:  10:01 AM  Progress in Treatment:   Attending groups: Yes Participating in groups: Yes Taking medication as prescribed: Yes  Tolerating medication: Yes Family/Significant other contact made: Yes  Patient understands diagnosis: Yes  Discussing patient identified problems/goals with staff: Yes Medical problems stabilized or resolved: Yes Denies suicidal/homicidal ideation: Yes Patient has not harmed self or others: Yes  For review of initial/current patient goals, please see plan of care.  Estimated Length of Stay:  D/C today  Reason for Continuation of Hospitalization:   New Problems/Goals identified:  N/A  Discharge Plan or Barriers:   [facility] [place]  Additional Comments:  Attendees:  Signature: Thedore MinsMojeed Akintayo, MD 12/29/2013 10:01 AM   Signature: Richelle Itood Darrill Vreeland, LCSW 12/29/2013 10:01 AM  Signature: Fransisca KaufmannLaura Davis, NP 12/29/2013 10:01 AM  Signature: Joslyn Devonaroline Beaudry, RN 12/29/2013 10:01 AM  Signature: Liborio NixonPatrice White, RN 12/29/2013 10:01 AM  Signature:  12/29/2013 10:01 AM  Signature:   12/29/2013 10:01 AM  Signature:    Signature:    Signature:    Signature:    Signature:    Signature:      Scribe for Treatment Team:   Richelle Itood Earsel Shouse, LCSW  12/29/2013 10:01 AM

## 2013-12-29 NOTE — Progress Notes (Signed)
Patient ID: Elizabeth BeckBrittney L [name], female   DOB: 1990-08-26, 23 y.o.   MRN: [id number] Patient discharged per physician order; patient denies SI/HI and A/V hallucinations; patient received prescriptions and copy of AVS after it was reviewed; patient has no other questions or concerns at this time; patient verbalized and signed that she received all belongings; patient left the unit ambulatory with the representative of [facility] [place]

## 2013-12-29 NOTE — Progress Notes (Signed)
Patient denying any issues this evening. States her plan is to discharge tomorrow for further substance abuse treatment. Cooperative and appropriate in the milieu. Requested vistaril for anxiety/sleep which was given without difficulty. Offered support, encouragement. Denies SI/HI/AVH and remains safe. Lawrence MarseillesFriedman, Journey Ratterman Eakes

## 2013-12-29 NOTE — [facility] Suicide Risk Assessment (Signed)
[facility] INPATIENT:  Family/Significant Other Suicide Prevention Education  Suicide Prevention Education:  Patient Discharged to Other Healthcare Facility:  Suicide Prevention Education Not Provided: [facility] [facility]  The patient is discharging to another healthcare facility for continuation of treatment.  The patient's medical information, including suicide ideations and risk factors, are a part of the medical information shared with the receiving healthcare facility.  Daryel Geraldorth, Moyinoluwa Dawe B 12/29/2013, 10:10 AM

## 2013-12-29 NOTE — Progress Notes (Signed)
[facility]  Will you be returning to the same living situation after discharge: No. At discharge, do you have transportation home?:Yes,  [facility] Do you have the ability to pay for your medications:Yes,  insurance  Release of information consent forms completed and in the chart;  Patient's signature needed at discharge.  Patient to Follow up at: Follow-up Information   Follow up with [facility] [place] On 12/29/2013. (They will pick you up at 11:00.)    Contact information:   [address]  [place]  [phone]      Patient denies SI/HI:   Yes,  yes    Safety Planning and Suicide Prevention discussed:  Yes,  yes  Daryel Geraldorth, Christpoher Sievers B 12/29/2013, 10:10 AM

## 2014-01-02 NOTE — Progress Notes (Signed)
Patient Discharge Instructions:  After Visit Summary (AVS):   Faxed to:  01/02/14 Discharge Summary Note:   Faxed to:  01/02/14 Psychiatric Admission Assessment Note:   Faxed to:  01/02/14 Suicide Risk Assessment - Discharge Assessment:   Faxed to:  01/02/14 Faxed/Sent to the Next Level Care provider:  01/02/14 [facility]axed to [facility] [place] @ [phone] Jerelene ReddenSheena E Orleans, 01/02/2014, 2:27 PM

## 2014-01-09 ENCOUNTER — Encounter (HOSPITAL_COMMUNITY): Payer: Self-pay | Admitting: Emergency Medicine

## 2014-01-09 ENCOUNTER — Emergency Department (HOSPITAL_COMMUNITY)
Admission: EM | Admit: 2014-01-09 | Discharge: 2014-01-09 | Disposition: A | Discharge status: Home / Self Care | Payer: BC Managed Care – PPO | Attending: Emergency Medicine | Admitting: Emergency Medicine

## 2014-01-09 DIAGNOSIS — F17200 Nicotine dependence, unspecified, uncomplicated: Secondary | ICD-10-CM | POA: Diagnosis not present

## 2014-01-09 DIAGNOSIS — Z880 Allergy status to penicillin: Secondary | ICD-10-CM | POA: Insufficient documentation

## 2014-01-09 DIAGNOSIS — Z79899 Other long term (current) drug therapy: Secondary | ICD-10-CM | POA: Diagnosis not present

## 2014-01-09 DIAGNOSIS — M545 Low back pain, unspecified: Secondary | ICD-10-CM | POA: Insufficient documentation

## 2014-01-09 DIAGNOSIS — Z792 Long term (current) use of antibiotics: Secondary | ICD-10-CM | POA: Insufficient documentation

## 2014-01-09 DIAGNOSIS — F319 Bipolar disorder, unspecified: Secondary | ICD-10-CM | POA: Insufficient documentation

## 2014-01-09 DIAGNOSIS — Z791 Long term (current) use of non-steroidal anti-inflammatories (NSAID): Secondary | ICD-10-CM | POA: Insufficient documentation

## 2014-01-09 MED ORDER — METHOCARBAMOL 500 MG PO TABS
500.0000 mg | ORAL_TABLET | Freq: Two times a day (BID) | ORAL | Status: DC
Start: 2014-01-09 — End: 2014-03-05

## 2014-01-09 MED ORDER — KETOROLAC TROMETHAMINE 30 MG/ML IJ SOLN
30.0000 mg | Freq: Once | INTRAMUSCULAR | Status: AC
  Administered 2014-01-09: 30 mg via INTRAMUSCULAR
  Filled 2014-01-09: qty 1

## 2014-01-09 MED ORDER — NAPROXEN 500 MG PO TABS: 500.0000 mg | ORAL_TABLET | Freq: Two times a day (BID) | ORAL | Status: DC

## 2014-01-09 NOTE — ED Notes (Signed)
Patient here with lower back pain which began Monday. States that she has been lifting heavy boxes at home, but didn't injure herself while doing so. States that she slept on a thing mattress and awoke with severe back pain.

## 2014-01-09 NOTE — ED Notes (Signed)
Pt c/o lower back pain x 2 days post moving heavy items. Announced loudly en route to her room that she needed a "narcotic" for her pain. Pt states that she has iced her back and last took 200mg  of ibuprofen 6 hrs ago with no relief.

## 2014-01-09 NOTE — ED Provider Notes (Signed)
CSN: [id number]     Arrival date & time 01/09/14  0411 History   First MD Initiated Contact with Patient 01/09/14 [phone]     Chief Complaint  Patient presents with  . Back Pain     (Consider location/radiation/quality/duration/timing/severity/associated sxs/prior Treatment) HPI Comments: Patient presents today with a chief complaint of left lower back pain.  She reports that the pain has been present for the past 3 days, but worse this morning.  She states that she has been doing some heavy lifting over the past 3-4 days.  Pain does not radiate.  Pain worse with movement.  She has taken Ibuprofen and Tylenol for the pain with little improvement.  She denies numbness, tingling, urinary symptoms, weakness, fever, chills, abdominal pain, or bowel/bladder incontinence.  Denies history of Cancer or IVDU.  The history is provided by the patient.    Past Medical History  Diagnosis Date  . Bipolar 1 disorder    Past Surgical History  Procedure Laterality Date  . Right foot surgery    . Abscess drainage      left groin  . Tonsillectomy and adenoidectomy    . Heel spur surgery     History reviewed. No pertinent family history. History  Substance Use Topics  . Smoking status: Current Every Day Smoker -- 1.00 packs/day    Types: Cigarettes  . Smokeless tobacco: Never Used  . Alcohol Use: 0.6 oz/week    1 Glasses of wine per week     Comment: occasional   OB History   Grav Para Term Preterm Abortions TAB SAB Ect Mult Living   1    1 1          Review of Systems  Constitutional: Negative for fever and chills.  Genitourinary: Negative for dysuria, urgency and frequency.       No bowel/bladder incontinence  Neurological: Negative for numbness.      Allergies  Depakote and Penicillins  Home Medications   Prior to Admission medications   Medication Sig Start Date End Date Taking? Authorizing Provider  carbamazepine (TEGRETOL XR) 200 MG 12 hr tablet Take 1 tablet (200 mg total) by  mouth 2 (two) times daily. 12/29/13  Yes Fransisca KaufmannLaura Davis, NP  cephALEXin (KEFLEX) 500 MG capsule Take 500 mg by mouth 2 (two) times daily. 01/02/14 01/11/14 Yes Historical Provider, MD  DM-Phenylephrine-Acetaminophen 10-5-325 MG CAPS Take 2 capsules by mouth 4 (four) times daily.   Yes Historical Provider, MD  hydrOXYzine (VISTARIL) 50 MG capsule Take 50 mg by mouth 2 (two) times daily.   Yes Historical Provider, MD  lurasidone 120 MG TABS Take 1 tablet (120 mg total) by mouth daily with supper. 12/29/13  Yes Fransisca KaufmannLaura Davis, NP  traZODone (DESYREL) 150 MG tablet Take 1 tablet (150 mg total) by mouth at bedtime. 12/29/13  Yes Fransisca KaufmannLaura Davis, NP  vitamin B-12 (CYANOCOBALAMIN) 1000 MCG tablet Take 2 tablets (2,000 mcg total) by mouth daily. 12/29/13  Yes Fransisca KaufmannLaura Davis, NP  albuterol (PROVENTIL HFA;VENTOLIN HFA) 108 (90 BASE) MCG/ACT inhaler Inhale 2 puffs into the lungs every 6 (six) hours as needed for wheezing or shortness of breath.    Historical Provider, MD  benztropine (COGENTIN) 1 MG tablet Take 1 tablet (1 mg total) by mouth daily with supper. 12/29/13   Fransisca KaufmannLaura Davis, NP   BP 114/63  Pulse 81  Temp(Src) 98 F (36.7 C) (Oral)  Resp 18  Ht 5\' 5"  (1.651 m)  SpO2 99%  LMP 11/03/2013 Physical Exam  Nursing note  and vitals reviewed. Constitutional: She appears well-developed and well-nourished.  HENT:  Head: Normocephalic and atraumatic.  Mouth/Throat: Oropharynx is clear and moist.  Neck: Normal range of motion. Neck supple.  Cardiovascular: Normal rate, regular rhythm and normal heart sounds.   Pulmonary/Chest: Effort normal and breath sounds normal.  Musculoskeletal:       Cervical back: She exhibits normal range of motion, no tenderness, no bony tenderness, no swelling, no edema and no deformity.       Thoracic back: She exhibits normal range of motion, no tenderness, no bony tenderness, no swelling, no edema and no deformity.       Lumbar back: She exhibits normal range of motion, no tenderness, no bony  tenderness, no swelling, no edema and no deformity.  Left lower paraspinal tenderness to palpation.  Increased pain with movement.  Neurological: She is alert. She has normal strength. No sensory deficit. Gait normal.  Reflex Scores:      Patellar reflexes are 2+ on the right side and 2+ on the left side. Distal sensation of both feet intact.  Skin: Skin is warm and dry.  Psychiatric: She has a normal mood and affect.    ED Course  Procedures (including critical care time) Labs Review Labs Reviewed - No data to display  Imaging Review No results found.   EKG Interpretation None     7:57 AM Reassessed patient.  She reports that her pain has resolved at this time. MDM   Final diagnoses:  None   Patient with back pain.  No neurological deficits and normal neuro exam.  Patient can walk but states is painful.  No loss of bowel or bladder control.  No concern for cauda equina.  No fever, night sweats, weight loss, h/o cancer, IVDU.  Patient discharged home with Robaxin and Naproxen.  Patient stable for discharge.  Return precautions given.     Santiago Glad, PA-C 01/09/14 0830

## 2014-01-09 NOTE — ED Provider Notes (Signed)
Medical screening examination/treatment/procedure(s) were performed by non-physician practitioner and as supervising physician I was immediately available for consultation/collaboration.   EKG Interpretation None        Rolan BuccoMelanie Eddye Broxterman, MD 01/09/14 [phone]

## 2014-01-12 ENCOUNTER — Other Ambulatory Visit (HOSPITAL_COMMUNITY): Payer: 59 | Attending: Psychiatry | Admitting: Psychology

## 2014-01-12 ENCOUNTER — Encounter (HOSPITAL_COMMUNITY): Payer: Self-pay | Admitting: Psychology

## 2014-01-12 DIAGNOSIS — IMO0002 Reserved for concepts with insufficient information to code with codable children: Secondary | ICD-10-CM | POA: Insufficient documentation

## 2014-01-12 DIAGNOSIS — F1220 Cannabis dependence, uncomplicated: Secondary | ICD-10-CM

## 2014-01-12 DIAGNOSIS — F1410 Cocaine abuse, uncomplicated: Secondary | ICD-10-CM | POA: Diagnosis not present

## 2014-01-12 DIAGNOSIS — F1210 Cannabis abuse, uncomplicated: Secondary | ICD-10-CM | POA: Insufficient documentation

## 2014-01-12 DIAGNOSIS — F3110 Bipolar disorder, current episode manic without psychotic features, unspecified: Secondary | ICD-10-CM | POA: Insufficient documentation

## 2014-01-13 ENCOUNTER — Encounter (HOSPITAL_COMMUNITY): Payer: Self-pay | Admitting: Psychology

## 2014-01-13 NOTE — Progress Notes (Signed)
    Daily Group Progress Note  Program: CD-IOP   Group Time: 1-2:30 pm  Participation Level: Active  Behavioral Response: Sharing, Grandiose, Disruptive and Agitated  Type of Therapy: Process Group  Topic: Process: the first part of group was spent in process. Members shared bout the past weekend. They disclosed about the meetings they had attended and other activities that have supported their recovery as well as any challenges or temptations that may have presented themselves. One member had relapsed and she was asked to share the events that had led to her drinking. She did not respond to my request - I reminded group members that the sheer act of disclosing and speaking one's experience is therapeutic and, ultimately, healing.  Also present were 3 new group members. Each of the new members was invited to share a little bit about themselves with their new group members.   Group Time: 2:45- 4pm  Participation Level: Active  Behavioral Response: Sharing, Disruptive and Blaming  Type of Therapy: Psycho-education Group  Topic: Cognitive Distortions: the second half of group was spent in a psycho-ed. Members were provided with a handout that identified different types of cognitive distortions. We read over the handout together and members commented on some of the distortions that they most frequently used. There was a good discussion and by sharing, members became more open and known among each other. During this group session, random drug tests were collected.   Summary: The patient was new to the group. She shared a little bit about herself and explained she had come [facility]. She hadn't found it very helpful and admitted she had come home and smoked a blunt the first day back. She had not known that because she was using [name] and other things she would have to stop using everything! The pateint explained that she had been using from 2009. She smoked weed everyday, but had  also been using a a lot of [name]. She drank a little bit and had also used cocaine. Despite having been to treatment, the patient displayed little insight and had questions about everything. She did not know what a 'dry drunk' was and another member explained it to her. She reported her mother was her biggest problem and she didn't know why she continued to live at home considering how much trouble her mother caused her. In the psycho-ed, the patient admitted she didn't trust anybody and didn't really have any friends. Other group members encouraged her to be open and make some friends in the Fellowship. The patient agreed she would attend some AA/NA meetings before our next group session. She did well and was quite talkative in session today. The patient has much to learn, but displayed a willingness and interest that will prove very helpful in remaining drug-free should she do as we ask. Her sobriety date is 8/7.    Family Program: Family present? No   Name of family member(s):   UDS collected: No Results:  AA/NA attended?: No , she is new to the program  Sponsor?: No   Jahleah Mariscal, LCAS

## 2014-01-13 NOTE — Progress Notes (Unsigned)
Elizabeth [name] CD-IOP: The patient completed her first group session today in the CD-IOP. She stayed after group and we completed some paperwork and then I walked her up to the parking lot where her mother was waiting for her. I introduced myself and explained a little about our program. The patient informed her mother that we would expect her to attend 12-step meetings. I suggested that perhaps in a couple of weeks both the patient and her mother would meet with me for a family session. The patient's mother agreed that she would do this, but could not meet until after 5:30. I agreed that we could work out a time that would be convenient to her. I wished them well and the patient reportd she would return on Wednesday for group.       Elizabeth [name], LCAS

## 2014-01-14 ENCOUNTER — Other Ambulatory Visit (HOSPITAL_COMMUNITY): Payer: 59 | Admitting: Psychology

## 2014-01-14 DIAGNOSIS — F3110 Bipolar disorder, current episode manic without psychotic features, unspecified: Secondary | ICD-10-CM | POA: Diagnosis not present

## 2014-01-14 DIAGNOSIS — F19921 Other psychoactive substance use, unspecified with intoxication with delirium: Secondary | ICD-10-CM

## 2014-01-14 DIAGNOSIS — F1220 Cannabis dependence, uncomplicated: Secondary | ICD-10-CM
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
## 2014-01-16 ENCOUNTER — Other Ambulatory Visit (HOSPITAL_COMMUNITY): Payer: 59

## 2014-01-16 ENCOUNTER — Encounter (HOSPITAL_COMMUNITY): Payer: Self-pay

## 2014-01-17 ENCOUNTER — Emergency Department (HOSPITAL_COMMUNITY)
Admission: EM | Admit: 2014-01-17 | Discharge: 2014-01-19 | Disposition: A | Discharge status: Home / Self Care | Payer: 59 | Attending: Emergency Medicine | Admitting: Emergency Medicine

## 2014-01-17 ENCOUNTER — Encounter (HOSPITAL_COMMUNITY): Payer: Self-pay | Admitting: Emergency Medicine

## 2014-01-17 DIAGNOSIS — Z880 Allergy status to penicillin: Secondary | ICD-10-CM | POA: Insufficient documentation

## 2014-01-17 DIAGNOSIS — R45850 Homicidal ideations: Secondary | ICD-10-CM | POA: Insufficient documentation

## 2014-01-17 DIAGNOSIS — F17200 Nicotine dependence, unspecified, uncomplicated: Secondary | ICD-10-CM | POA: Insufficient documentation

## 2014-01-17 DIAGNOSIS — F309 Manic episode, unspecified: Secondary | ICD-10-CM | POA: Insufficient documentation

## 2014-01-17 DIAGNOSIS — Z79899 Other long term (current) drug therapy: Secondary | ICD-10-CM | POA: Diagnosis not present

## 2014-01-17 DIAGNOSIS — Z791 Long term (current) use of non-steroidal anti-inflammatories (NSAID): Secondary | ICD-10-CM | POA: Diagnosis not present

## 2014-01-17 DIAGNOSIS — Z91199 Patient's noncompliance with other medical treatment and regimen due to unspecified reason: Secondary | ICD-10-CM | POA: Diagnosis not present

## 2014-01-17 DIAGNOSIS — Z046 Encounter for general psychiatric examination, requested by authority: Secondary | ICD-10-CM | POA: Diagnosis present

## 2014-01-17 DIAGNOSIS — Z9119 Patient's noncompliance with other medical treatment and regimen: Secondary | ICD-10-CM | POA: Insufficient documentation

## 2014-01-17 DIAGNOSIS — F3110 Bipolar disorder, current episode manic without psychotic features, unspecified: Secondary | ICD-10-CM

## 2014-01-17 LAB — COMPREHENSIVE METABOLIC PANEL
ALK PHOS: 73 U/L (ref 39–117)
ALT: 13 U/L (ref 0–35)
ANION GAP: 14 (ref 5–15)
AST: 18 U/L (ref 0–37)
Albumin: 4.3 g/dL (ref 3.5–5.2)
BUN: 9 mg/dL (ref 6–23)
CO2: 20 mEq/L (ref 19–32)
Calcium: 9.9 mg/dL (ref 8.4–10.5)
Chloride: 103 mEq/L (ref 96–112)
Creatinine, Ser: 0.59 mg/dL (ref 0.50–1.10)
GFR calc Af Amer: >90 mL/min (ref >90)
GFR calc non Af Amer: >90 mL/min (ref >90)
Glucose, Bld: 104 mg/dL — ABNORMAL HIGH (ref 70–99)
Potassium: 3.8 mEq/L (ref 3.7–5.3)
Sodium: 137 mEq/L (ref 137–147)
TOTAL PROTEIN: 8.2 g/dL (ref 6.0–8.3)
Total Bilirubin: 0.3 mg/dL (ref 0.3–1.2)

## 2014-01-17 LAB — POC URINE PREG, ED: Preg Test, Ur: NEGATIVE

## 2014-01-17 LAB — CBC
HEMATOCRIT: 38 % (ref 36.0–46.0)
HEMOGLOBIN: 13.3 g/dL (ref 12.0–15.0)
MCH: 30.4 pg (ref 26.0–34.0)
MCHC: 35 g/dL (ref 30.0–36.0)
MCV: 87 fL (ref 78.0–100.0)
Platelets: 290 10*3/uL (ref 150–400)
RBC: 4.37 MIL/uL (ref 3.87–5.11)
RDW: 12.3 % (ref 11.5–15.5)
WBC: 7.2 10*3/uL (ref 4.0–10.5)

## 2014-01-17 LAB — ACETAMINOPHEN LEVEL: Acetaminophen (Tylenol), Serum: <15 ug/mL (ref 10–30)

## 2014-01-17 LAB — RAPID URINE DRUG SCREEN, HOSP PERFORMED
AMPHETAMINES: NOT DETECTED
Barbiturates: NOT DETECTED
Benzodiazepines: NOT DETECTED
COCAINE: NOT DETECTED
Opiates: NOT DETECTED
TETRAHYDROCANNABINOL: POSITIVE — AB

## 2014-01-17 LAB — ETHANOL: Alcohol, Ethyl (B): <11 mg/dL (ref 0–11)

## 2014-01-17 LAB — SALICYLATE LEVEL: Salicylate Lvl: <2 mg/dL — ABNORMAL LOW (ref 2.8–20.0)

## 2014-01-17 NOTE — ED Notes (Signed)
Pt. and belongings wanded by security 

## 2014-01-17 NOTE — ED Notes (Signed)
Pt presents with c/o medical clearance. Per police, pt's mother said that she was going to take out papers because pt and mother have been arguing back and forth and communicating threats. Pt denies SI/HI and when asked pt if she has a hx of depression or anxiety she says "what's anxiety?" Pt does have a psychiatric hx, pt is upset about being here but came here voluntarily because her mother was going to take out papers. Pt is somewhat uncooperative with this RN and very abrasive when asked questions.

## 2014-01-17 NOTE — ED Notes (Signed)
Pt's family is currently taking out IVC papers on the patient. Spoke with pt's mother and mother reports that pt was making threats at home saying that "she had a gun and she was going to shoot her". Dr. Littie DeedsGentry made aware of this and pt is not to leave the ER as IVC papers are en route.

## 2014-01-17 NOTE — ED Provider Notes (Signed)
CSN: [id number]     Arrival date & time 01/17/14  2206 History   First MD Initiated Contact with Patient 01/17/14 2229     Chief Complaint  Patient presents with  . Medical Clearance     (Consider location/radiation/quality/duration/timing/severity/associated sxs/prior Treatment) HPI 23 y.o. female with history of bipolar disorder presents today with the police due to concern for homicidal ideation by her mother. Mother is actively taking out an IVC at time of my valuation.  Per police patient has reportedly been noncompliant with psychiatric medications, and her mother is concerned that the patient has stated that she was going to take a gun and shoot people. BCP pork is currently being filled by the magistrate. Per the patient, she began to have an argument with her mother, who she feels might be crazy, and she may the generic statement about violence in the world which she believes her mother took to mean that she was homicidal. She actively denies suicidal, homicidal ideation, or hallucinations at this time.  She does admit to being noncompliant with bipolar medication.  She denies associated fever, nausea, vomiting.  Nothing exacerbates or alleviates her symptoms, the timing is constant.[name]  location general.  Past Medical History  Diagnosis Date  . Bipolar 1 disorder    Past Surgical History  Procedure Laterality Date  . Right foot surgery    . Abscess drainage      left groin  . Tonsillectomy and adenoidectomy    . Heel spur surgery     Family History  Problem Relation Age of Onset  . Alcohol abuse Father   . Alcohol abuse Maternal Grandfather   . Alcohol abuse Paternal Grandfather    History  Substance Use Topics  . Smoking status: Current Every Day Smoker -- 1.00 packs/day    Types: Cigarettes  . Smokeless tobacco: Never Used  . Alcohol Use: 0.6 oz/week    1 Glasses of wine per week     Comment: occasional   OB History   Grav Para Term Preterm Abortions TAB SAB Ect Mult  Living   1    1 1          Review of Systems  Unable to perform ROS: Psychiatric disorder      Allergies  Depakote and Penicillins  Home Medications   Prior to Admission medications   Medication Sig Start Date End Date Taking? Authorizing Provider  albuterol (PROVENTIL HFA;VENTOLIN HFA) 108 (90 BASE) MCG/ACT inhaler Inhale 2 puffs into the lungs every 6 (six) hours as needed for wheezing or shortness of breath.    Historical Provider, MD  benztropine (COGENTIN) 1 MG tablet Take 1 tablet (1 mg total) by mouth daily with supper. 12/29/13   Fransisca Kaufmann, NP  carbamazepine (TEGRETOL XR) 200 MG 12 hr tablet Take 1 tablet (200 mg total) by mouth 2 (two) times daily. 12/29/13   Fransisca Kaufmann, NP  DM-Phenylephrine-Acetaminophen 10-5-325 MG CAPS Take 2 capsules by mouth 4 (four) times daily.    Historical Provider, MD  hydrOXYzine (VISTARIL) 50 MG capsule Take 50 mg by mouth 2 (two) times daily.    Historical Provider, MD  lurasidone 120 MG TABS Take 1 tablet (120 mg total) by mouth daily with supper. 12/29/13   Fransisca Kaufmann, NP  methocarbamol (ROBAXIN) 500 MG tablet Take 1 tablet (500 mg total) by mouth 2 (two) times daily. 01/09/14   Heather Laisure, PA-C  naproxen (NAPROSYN) 500 MG tablet Take 1 tablet (500 mg total) by mouth 2 (two) times  daily. 01/09/14   Santiago GladHeather Laisure, PA-C  traZODone (DESYREL) 150 MG tablet Take 1 tablet (150 mg total) by mouth at bedtime. 12/29/13   Fransisca KaufmannLaura Davis, NP  vitamin B-12 (CYANOCOBALAMIN) 1000 MCG tablet Take 2 tablets (2,000 mcg total) by mouth daily. 12/29/13   Fransisca KaufmannLaura Davis, NP   BP 116/85  Pulse 99  Temp(Src) 98.6 F (37 C) (Oral)  Resp 16  SpO2 99% Physical Exam  Vitals reviewed. Constitutional: She is oriented to person, place, and time. She appears well-developed and well-nourished.  HENT:  Head: Normocephalic and atraumatic.  Right Ear: External ear normal.  Left Ear: External ear normal.  Eyes: Conjunctivae and EOM are normal. Pupils are equal, round, and  reactive to light.  Neck: Normal range of motion. Neck supple.  Cardiovascular: Normal rate, regular rhythm, normal heart sounds and intact distal pulses.   Pulmonary/Chest: Effort normal and breath sounds normal.  Abdominal: Soft. Bowel sounds are normal. There is no tenderness.  Musculoskeletal: Normal range of motion.  Neurological: She is alert and oriented to person, place, and time.  Skin: Skin is warm and dry.    ED Course  Procedures (including critical care time) Labs Review Labs Reviewed - No data to display  Imaging Review No results found.   EKG Interpretation None      MDM   Final diagnoses:  None    23 y.o. female  with pertinent PMH of bipolar 1 presents with likely manic episode, with mother currently taking IVC paperwork for homicidal ideation. Patient arrived to the custody of police, with concern for homicidal ideation with plan to use a gun.  On arrival the patient has vital signs as above, appears manic on exam with rapid and pressured speech.  She also displays tangential thought.  At the time of my exam I did not feel the patient should be safe to be discharged on her own.  Lab work drawn for medical clearance. Psychiatry consulted.    Labs and imaging as above reviewed.   No diagnosis found.      Mirian MoMatthew Gentry, MD 01/17/14 [phone]

## 2014-01-18 DIAGNOSIS — F309 Manic episode, unspecified: Secondary | ICD-10-CM | POA: Diagnosis not present

## 2014-01-18 DIAGNOSIS — F332 Major depressive disorder, recurrent severe without psychotic features: Secondary | ICD-10-CM

## 2014-01-18 MED ORDER — ONDANSETRON HCL 4 MG PO TABS: 4.0000 mg | ORAL_TABLET | Freq: Three times a day (TID) | ORAL | Status: DC | PRN

## 2014-01-18 MED ORDER — ALUM & MAG HYDROXIDE-SIMETH 200-200-20 MG/5ML PO SUSP: 30.0000 mL | ORAL | Status: DC | PRN

## 2014-01-18 MED ORDER — NICOTINE 21 MG/24HR TD PT24: 21.0000 mg | MEDICATED_PATCH | Freq: Every day | TRANSDERMAL | Status: DC

## 2014-01-18 MED ORDER — ACETAMINOPHEN 325 MG PO TABS: 650.0000 mg | ORAL_TABLET | ORAL | Status: DC | PRN

## 2014-01-18 MED ORDER — LORAZEPAM 1 MG PO TABS
2.0000 mg | ORAL_TABLET | Freq: Once | ORAL | Status: AC
  Administered 2014-01-18: 2 mg via ORAL
  Filled 2014-01-18: qty 2

## 2014-01-18 MED ORDER — IBUPROFEN 200 MG PO TABS: 600.0000 mg | ORAL_TABLET | Freq: Three times a day (TID) | ORAL | Status: DC | PRN

## 2014-01-18 MED ORDER — LORAZEPAM 1 MG PO TABS
1.0000 mg | ORAL_TABLET | Freq: Once | ORAL | Status: AC
  Administered 2014-01-18: 1 mg via ORAL
  Filled 2014-01-18: qty 1

## 2014-01-18 MED ORDER — ZOLPIDEM TARTRATE 5 MG PO TABS
5.0000 mg | ORAL_TABLET | Freq: Every evening | ORAL | Status: DC | PRN
  Administered 2014-01-18: 5 mg via ORAL
  Filled 2014-01-18: qty 1

## 2014-01-18 NOTE — ED Notes (Addendum)
Social work team member at bedside.

## 2014-01-18 NOTE — BH Assessment (Signed)
[facility] Assessment Progress Note Pt accepted by [facility] per Joni ReiningNicole by Dr. Robet Leuhotakura after 7am. Call report to [phone].

## 2014-01-18 NOTE — BH Assessment (Signed)
[facility] Assessment Progress Note   The following facilities were contacted and pt referral faxed, as possible beds available:   [facility]  [facility]  [facility]  [facility]  [facility]  [facility]  [facility] - no answer, but referral faxed  [facility]  [facility]  [facility]  [facility] - left message and referral faxed  [facility]   No beds at the following facilities:  [facility]  [facility]  [facility]  [facility]  [facility]  [facility]  [facility]  [facility]  [facility]  [facility] (only takes voluntary patients)  [facility] - no answer  [facility] - no answer  [facility]   TTS will continue to seek placement for the pt.   Kristen Ryliee Figge, MS, LPC  Licensed Professional Counselor  Triage Specialist     

## 2014-01-18 NOTE — ED Notes (Signed)
Patient is labile this shift vacillating between being calm and appropriate and tearful with demands.  Frequent redirection necessary.  Focused on family and friend conflicts a with minimal insight.  Support offered.
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
## 2014-01-18 NOTE — BH Assessment (Signed)
[facility] Assessment Progress Note  Disposition: Patient meets criteria for inpatient admission at this time. TTS to seek placement at surrounding facilities due to [facility] being [facility].   Janann ColonelGregory Pickett Jr. MSW, LCSW Therapeutic Triage Services-Triage Specialist   Phone: [phone] Fax: [phone]

## 2014-01-18 NOTE — BH Assessment (Signed)
[facility]  -Dossie ArbourAnthony Kella Splinter, MA  Disposition M[facility]

## 2014-01-18 NOTE — ED Notes (Addendum)
Pt resting at present, denies SI at present, will continue to monitor for safety.  Pending [facility] after 7am tomorrow.

## 2014-01-18 NOTE — Progress Notes (Signed)
MHT initiated bed placement at the following psychiatric facilities with bed availability:  1)[facility]-faxed referral 2)[facility]-no beds 3)[facility]-no beds 4)[facility]-no beds 5)[facility]-faxed referral 6)[facility]-no beds, call in the AM 7)[facility]-no beds 8)[facility]-no beds 9)[facility]-faxed referral 10)[facility]-faxed referral  Blain PaisMichelle L Mileidy Atkin, MHT/NS

## 2014-01-18 NOTE — Consult Note (Signed)
[address] Psychiatry Consult   Reason for Consult:  Anger, hx Bipolar disorder Referring Physician:  EDP Elizabeth [name] is an 23 y.o. female. Total Time spent with patient: 1 hour  Assessment: AXIS I:  Bipolar, Manic and Major Depression, Recurrent severe AXIS II:  Deferred AXIS III:   Past Medical History  Diagnosis Date  . Bipolar 1 disorder    AXIS IV:  other psychosocial or environmental problems and problems related to social environment AXIS V:  41-50 serious symptoms  Plan:  Recommend psychiatric Inpatient admission when medically cleared.  Subjective:   Elizabeth [name] is a 22 y.o. female patient admitted with Anger, anxiety issues.  HPI:   Patient is well known to this ER and our [facility] for her frequent visits and hospitalization.  Patient was brought in under IVC by the [facility].  Her mother  Reported that patient was homicidal towards her.  This is an ongoing situation between patient and her mother.  Patient denied making any threat towards her mother.  Patient's mother reported that patient is non compliant with her Psychotropic medications.  Patient reported today that she is worried about her mother who is suffering from cancer.  Patient states she does not know why she does not get along well with her mother.  Patient reported that her mother is always admitting her to the hospital if she does not do what she asked her to do.  Patient denied SI/HI/AVH.  Patient reported poor sleep and appetite.   Patient stated that she  And her mother are suffering from PTSD  But could not explain the source of their PTSD.  [facility] is [facility] for [facility] and will be transferred as soon as transportation is available.   HPI Elements:   Location:  Bipolar manic, Major depressive disorder.. Quality:  Anger issues, anxiety, depressed mood, medication non compliant. Severity:  severe. Context:  Not taking medication, homicidal thoughts towards mom..  Past  Psychiatric History: Past Medical History  Diagnosis Date  . Bipolar 1 disorder     reports that she has been smoking Cigarettes.  She has been smoking about 1.00 pack per day. She has never used smokeless tobacco. She reports that she drinks about .6 ounces of alcohol per week. She reports that she uses illicit drugs (Marijuana and Cocaine). Family History  Problem Relation Age of Onset  . Alcohol abuse Father   . Alcohol abuse Maternal Grandfather   . Alcohol abuse Paternal Grandfather    Family History Substance Abuse: No Family Supports: Yes, List: (Step dad) Living Arrangements: Parent Can pt return to current living arrangement?: Yes Abuse/Neglect [place]) Physical Abuse: Denies Verbal Abuse: Denies Sexual Abuse: Denies Allergies:   Allergies  Allergen Reactions  . Depakote [Divalproex Sodium] Anaphylaxis and Other (See Comments)    "Almost died"  . Penicillins Anaphylaxis  . Vistaril [Hydroxyzine Hcl]     ACT Assessment Complete:  Yes:    Educational Status    Risk to Self: Risk to self with the past 6 months Suicidal Ideation: No-Not Currently/Within Last 6 Months Suicidal Intent: No-Not Currently/Within Last 6 Months Is patient at risk for suicide?: No Suicidal Plan?: No-Not Currently/Within Last 6 Months Access to Means: No What has been your use of drugs/alcohol within the last 12 months?: ETOH and THC Previous Attempts/Gestures: No How many times?:  (Patient denies) Triggers for Past Attempts: Unknown Intentional Self Injurious Behavior: None Family Suicide History: No Recent stressful life event(s): Conflict (Comment) (Conflict  with mother) Persecutory voices/beliefs?: No Depression: Yes Depression Symptoms: Feeling angry/irritable Substance abuse history and/or treatment for substance abuse?: Yes Suicide prevention information given to non-admitted patients: Not applicable  Risk to Others: Risk to Others within the past 6 months Homicidal Ideation: No-Not  Currently/Within Last 6 Months Thoughts of Harm to Others: No-Not Currently Present/Within Last 6 Months Current Homicidal Intent: No-Not Currently/Within Last 6 Months Current Homicidal Plan: No Access to Homicidal Means: No Identified Victim: Patient denies History of harm to others?: No Assessment of Violence: In distant past Violent Behavior Description: Pt is cooperative yet yelling Does patient have access to weapons?: No Criminal Charges Pending?: No Does patient have a court date: No  Abuse: Abuse/Neglect Assessment (Assessment to be complete while patient is alone) Physical Abuse: Denies Verbal Abuse: Denies Sexual Abuse: Denies Exploitation of patient/patient's resources: Denies Self-Neglect: Denies  Prior Inpatient Therapy: Prior Inpatient Therapy Prior Inpatient Therapy: Yes Prior Therapy Dates: 2015 Prior Therapy Facilty/Provider(s): [facility] Reason for Treatment: Psychosis  Prior Outpatient Therapy: Prior Outpatient Therapy Prior Outpatient Therapy: Yes Prior Therapy Dates: Current Prior Therapy Facilty/Provider(s): [facility] Reason for Treatment: SA Tx  Additional Information: Additional Information 1:1 In Past 12 Months?: No CIRT Risk: No Elopement Risk: No Does patient have medical clearance?: Yes     Objective: Blood pressure 116/75, pulse 66, temperature 98.7 F (37.1 C), temperature source Oral, resp. rate 16, SpO2 100.00%.There is no weight on file to calculate BMI. Results for orders placed during the hospital encounter of 01/17/14 (from the past 72 hour(s))  URINE RAPID DRUG SCREEN (HOSP PERFORMED)     Status: Abnormal   Collection Time    01/17/14 11:02 PM      Result Value Ref Range   Opiates NONE DETECTED  NONE DETECTED   Cocaine NONE DETECTED  NONE DETECTED   Benzodiazepines NONE DETECTED  NONE DETECTED   Amphetamines NONE DETECTED  NONE DETECTED   Tetrahydrocannabinol POSITIVE (*) NONE DETECTED   Barbiturates NONE DETECTED  NONE DETECTED   Comment:             DRUG SCREEN FOR MEDICAL PURPOSES     ONLY.  IF CONFIRMATION IS NEEDED     FOR ANY PURPOSE, NOTIFY LAB     WITHIN 5 DAYS.                LOWEST DETECTABLE LIMITS     FOR URINE DRUG SCREEN     Drug Class       Cutoff (ng/mL)     Amphetamine      1000     Barbiturate      200     Benzodiazepine   458     Tricyclics       099     Opiates          300     Cocaine          300     THC              50  POC URINE PREG, ED     Status: None   Collection Time    01/17/14 11:06 PM      Result Value Ref Range   Preg Test, Ur NEGATIVE  NEGATIVE   Comment:            THE SENSITIVITY OF THIS     METHODOLOGY IS >24 mIU/mL  ACETAMINOPHEN LEVEL     Status: None   Collection Time    01/17/14  11:08 PM      Result Value Ref Range   Acetaminophen (Tylenol), Serum <15.0  10 - 30 ug/mL   Comment:            THERAPEUTIC CONCENTRATIONS VARY     SIGNIFICANTLY. A RANGE OF 10-30     ug/mL MAY BE AN EFFECTIVE     CONCENTRATION FOR MANY PATIENTS.     HOWEVER, SOME ARE BEST TREATED     AT CONCENTRATIONS OUTSIDE THIS     RANGE.     ACETAMINOPHEN CONCENTRATIONS     >150 ug/mL AT 4 HOURS AFTER     INGESTION AND >50 ug/mL AT 12     HOURS AFTER INGESTION ARE     OFTEN ASSOCIATED WITH TOXIC     REACTIONS.  CBC     Status: None   Collection Time    01/17/14 11:08 PM      Result Value Ref Range   WBC 7.2  4.0 - 10.5 K/uL   RBC 4.37  3.87 - 5.11 MIL/uL   Hemoglobin 13.3  12.0 - 15.0 g/dL   HCT 38.0  36.0 - 46.0 %   MCV 87.0  78.0 - 100.0 fL   MCH 30.4  26.0 - 34.0 pg   MCHC 35.0  30.0 - 36.0 g/dL   RDW 12.3  11.5 - 15.5 %   Platelets 290  150 - 400 K/uL  COMPREHENSIVE METABOLIC PANEL     Status: Abnormal   Collection Time    01/17/14 11:08 PM      Result Value Ref Range   Sodium 137  137 - 147 mEq/L   Potassium 3.8  3.7 - 5.3 mEq/L   Chloride 103  96 - 112 mEq/L   CO2 20  19 - 32 mEq/L   Glucose, Bld 104 (*) 70 - 99 mg/dL   BUN 9  6 - 23 mg/dL   Creatinine, Ser 0.59  0.50 - 1.10  mg/dL   Calcium 9.9  8.4 - 10.5 mg/dL   Total Protein 8.2  6.0 - 8.3 g/dL   Albumin 4.3  3.5 - 5.2 g/dL   AST 18  0 - 37 U/L   ALT 13  0 - 35 U/L   Alkaline Phosphatase 73  39 - 117 U/L   Total Bilirubin 0.3  0.3 - 1.2 mg/dL   GFR calc non Af Amer >90  >90 mL/min   GFR calc Af Amer >90  >90 mL/min   Comment: (NOTE)     The eGFR has been calculated using the CKD EPI equation.     This calculation has not been validated in all clinical situations.     eGFR's persistently <90 mL/min signify possible Chronic Kidney     Disease.   Anion gap 14  5 - 15  ETHANOL     Status: None   Collection Time    01/17/14 11:08 PM      Result Value Ref Range   Alcohol, Ethyl (B) <11  0 - 11 mg/dL   Comment:            LOWEST DETECTABLE LIMIT FOR     SERUM ALCOHOL IS 11 mg/dL     FOR MEDICAL PURPOSES ONLY  SALICYLATE LEVEL     Status: Abnormal   Collection Time    01/17/14 11:08 PM      Result Value Ref Range   Salicylate Lvl <7.7 (*) 2.8 - 20.0 mg/dL   Labs are reviewed and are  pertinent for Unremarkable, uds is positive for Marijuana  Current Facility-Administered Medications  Medication Dose Route Frequency Provider Last Rate Last Dose  . acetaminophen (TYLENOL) tablet 650 mg  650 mg Oral Q4H PRN Carlisle Cater, PA-C      . alum & mag hydroxide-simeth (MAALOX/MYLANTA) 200-200-20 MG/5ML suspension 30 mL  30 mL Oral PRN Carlisle Cater, PA-C      . ibuprofen (ADVIL,MOTRIN) tablet 600 mg  600 mg Oral Q8H PRN Carlisle Cater, PA-C      . nicotine (NICODERM CQ - dosed in mg/24 hours) patch 21 mg  21 mg Transdermal Daily Carlisle Cater, PA-C      . ondansetron (ZOFRAN) tablet 4 mg  4 mg Oral Q8H PRN Carlisle Cater, PA-C      . zolpidem (AMBIEN) tablet 5 mg  5 mg Oral QHS PRN Kalman Drape, MD   5 mg at 01/18/14 [id number]   Current Outpatient Prescriptions  Medication Sig Dispense Refill  . albuterol (PROVENTIL HFA;VENTOLIN HFA) 108 (90 BASE) MCG/ACT inhaler Inhale 2 puffs into the lungs every 6 (six) hours as  needed for wheezing or shortness of breath.      . benztropine (COGENTIN) 1 MG tablet Take 1 tablet (1 mg total) by mouth daily with supper.  30 tablet  0  . carbamazepine (TEGRETOL XR) 200 MG 12 hr tablet Take 1 tablet (200 mg total) by mouth 2 (two) times daily.  60 tablet  0  . hydrOXYzine (VISTARIL) 50 MG capsule Take 50 mg by mouth 2 (two) times daily.      [name] lurasidone 120 MG TABS Take 1 tablet (120 mg total) by mouth daily with supper.  30 tablet  0  . methocarbamol (ROBAXIN) 500 MG tablet Take 1 tablet (500 mg total) by mouth 2 (two) times daily.  20 tablet  0  . naproxen (NAPROSYN) 500 MG tablet Take 1 tablet (500 mg total) by mouth 2 (two) times daily.  30 tablet  0  . traZODone (DESYREL) 150 MG tablet Take 1 tablet (150 mg total) by mouth at bedtime.  30 tablet  0  . vitamin B-12 (CYANOCOBALAMIN) 1000 MCG tablet Take 2 tablets (2,000 mcg total) by mouth daily.        Psychiatric Specialty Exam:     Blood pressure 116/75, pulse 66, temperature 98.7 F (37.1 C), temperature source Oral, resp. rate 16, SpO2 100.00%.There is no weight on file to calculate BMI.  General Appearance: Casual and Fairly Groomed  Eye Contact::  Good  Speech:  Clear and Coherent and Pressured  Volume:  Normal  Mood:  Angry, Anxious, Depressed and Irritable  Affect:  Congruent, Depressed, Flat, Labile and Tearful  Thought Process:  Coherent, Goal Directed and Intact  Orientation:  Full (Time, Place, and Person)  Thought Content:  WDL  Suicidal Thoughts:  No  Homicidal Thoughts:  No  Memory:  Immediate;   Good Recent;   Fair Remote;   Fair  Judgement:  Poor  Insight:  Fair  Psychomotor Activity:  Normal  Concentration:  Fair  Recall:  NA  Fund of Knowledge:Fair  Language: Good  Akathisia:  NA  Handed:  Right  AIMS (if indicated):     Assets:  Desire for Improvement  Sleep:      Musculoskeletal: Strength & Muscle Tone: within normal limits Gait & Station: normal Patient leans:  N/A  Treatment Plan Summary: Patient is being transferred to [facility] [address]  Charmaine Downs, C   [id number] 01/18/2014 4:50 PM  Patient is seen face to face for psychiatric evaluation, case discussed with physician extender and treatment team, and formulated appropriate treatment and disposition plan. Reviewed the information documented and agree with the treatment plan.  Josuel Koeppen,JANARDHAHA R. 01/18/2014 8:59 PM

## 2014-01-19 ENCOUNTER — Other Ambulatory Visit (HOSPITAL_COMMUNITY): Payer: 59

## 2014-01-19 NOTE — ED Notes (Addendum)
[facility] requested, pending transport to [facility].  Accepting MD, Dr Robet Leuhotakura, call report to [phone].

## 2014-01-19 NOTE — ED Notes (Signed)
Pt. Alert and oriented. Denies problems or complaints. Pt. Denies SI, HI, AVH and pain. Pt. Informed of impending transport to [facility].

## 2014-01-19 NOTE — ED Notes (Signed)
Report from Therapist, nutritionalLatricia RN. Pt. Sleeping, respirations regular and unlabored.

## 2014-01-19 NOTE — ED Notes (Signed)
Pt. Discharged to [facility] for transport to [facility]. Pt. Alert and oriented, cooperative, warm and dry in no distress.

## 2014-01-20 ENCOUNTER — Telehealth (HOSPITAL_COMMUNITY): Payer: Self-pay | Admitting: Psychology

## 2014-01-20 ENCOUNTER — Encounter (HOSPITAL_COMMUNITY): Payer: Self-pay | Admitting: Psychology

## 2014-01-20 NOTE — Progress Notes (Signed)
    Daily Group Progress Note Program: [facility]   Group Time: 1-2:30 pm  Participation Level: Active  Behavioral Response: Sharing, Disruptive and Intrusive  Type of Therapy: Psycho-education Group  Topic: Chaplain: the first half of group was spent in a psycho-ed with a guest speaker. PW, a Chaplain with [employer] appeared and led a discussion on "Spirituality". The emphasis was on being present, acceptance and forgiveness. Members shared about their experiences and beliefs. When PW asked the group what they needed, a number of members agreed that they weren't sure that 'answers' would provide them with what they really needed. The session generated even more questions then before the session. During group today, the medical director, CK, met with 2 new group members and provided refills where needed.   Group Time: 2:45- 4pm  Participation Level: Active  Behavioral Response: Sharing and Disruptive  Type of Therapy: Process Group  Topic: Process: the second half of group was spent in process. Members shared about the past few days and any concerns or issues that may have challenged or questioned their sobriety. No one had relapsed since the last session. One member had not appeared yesterday for our individual appointment and I questioned why she had called and cancelled. She admitted her boyfriend had told her not to go. The group responded with disbelief. The importance of making choices and decisions in early recovery was emphasized. I also reminded members that if they didn't make the choices that were needed, others would make them for them. I disclosed that a guest facilitator would be present for the next two sessions and encouraged the group to engage as they always do.   Summary: The patient was attentive during the visit with the chaplain. She interrupted a few times with questions that were beyond the scope of the topic. The patient reported she 'always went to church' as a  child, with her grandmother, but it was very conservative. She agreed with another group member that 'you didn't ask questions'. the patient met with the medical director, CK, during this part of the session. In process, the patient reported she had not attended any 12-step meetings since the last group session, but had spoken with another group member and she was going to give her a ride to a meeting tonight. The patient speaks in a manner that is a little out of sync with the rest of the group, but it may be, in part, because she is nervous and unfamiliar with how group works. She remains drug-free with a sobriety date of 8/7.    Family Program: Family present? No   Name of family member(s):   UDS collected: No Results:   AA/NA attended?: No , but this is just her second group session  Sponsor?: No   Alessandro Griep, LCAS

## 2014-01-21 ENCOUNTER — Other Ambulatory Visit (HOSPITAL_COMMUNITY): Payer: 59

## 2014-01-23 ENCOUNTER — Other Ambulatory Visit (HOSPITAL_COMMUNITY): Payer: 59

## 2014-01-26 ENCOUNTER — Other Ambulatory Visit (HOSPITAL_COMMUNITY): Payer: 59

## 2014-01-28 ENCOUNTER — Other Ambulatory Visit (HOSPITAL_COMMUNITY): Payer: 59

## 2014-01-30 ENCOUNTER — Other Ambulatory Visit (HOSPITAL_COMMUNITY): Payer: 59

## 2014-02-02 ENCOUNTER — Other Ambulatory Visit (HOSPITAL_COMMUNITY): Payer: 59

## 2014-02-04 ENCOUNTER — Other Ambulatory Visit (HOSPITAL_COMMUNITY): Payer: 59 | Attending: Psychiatry

## 2014-02-06 ENCOUNTER — Other Ambulatory Visit (HOSPITAL_COMMUNITY): Payer: 59

## 2014-02-11 ENCOUNTER — Other Ambulatory Visit (HOSPITAL_COMMUNITY): Payer: 59

## 2014-02-13 ENCOUNTER — Other Ambulatory Visit (HOSPITAL_COMMUNITY): Payer: 59

## 2014-02-16 ENCOUNTER — Other Ambulatory Visit (HOSPITAL_COMMUNITY): Payer: 59

## 2014-02-18 ENCOUNTER — Other Ambulatory Visit (HOSPITAL_COMMUNITY): Payer: 59

## 2014-02-20 ENCOUNTER — Other Ambulatory Visit (HOSPITAL_COMMUNITY): Payer: 59

## 2014-02-23 ENCOUNTER — Other Ambulatory Visit (HOSPITAL_COMMUNITY): Payer: 59

## 2014-02-25 ENCOUNTER — Other Ambulatory Visit (HOSPITAL_COMMUNITY): Payer: 59

## 2014-02-27 ENCOUNTER — Other Ambulatory Visit (HOSPITAL_COMMUNITY): Payer: 59

## 2014-03-02 ENCOUNTER — Other Ambulatory Visit (HOSPITAL_COMMUNITY): Payer: 59

## 2014-03-04 ENCOUNTER — Other Ambulatory Visit (HOSPITAL_COMMUNITY): Payer: 59

## 2014-03-05 ENCOUNTER — Emergency Department (HOSPITAL_COMMUNITY)
Admission: EM | Admit: 2014-03-05 | Discharge: 2014-03-05 | Disposition: A | Discharge status: Home / Self Care | Payer: 59 | Attending: Emergency Medicine | Admitting: Emergency Medicine

## 2014-03-05 ENCOUNTER — Encounter (HOSPITAL_COMMUNITY): Payer: Self-pay | Admitting: Emergency Medicine

## 2014-03-05 DIAGNOSIS — Z79899 Other long term (current) drug therapy: Secondary | ICD-10-CM | POA: Insufficient documentation

## 2014-03-05 DIAGNOSIS — Z3202 Encounter for pregnancy test, result negative: Secondary | ICD-10-CM | POA: Diagnosis not present

## 2014-03-05 DIAGNOSIS — Z720 Tobacco use: Secondary | ICD-10-CM | POA: Insufficient documentation

## 2014-03-05 DIAGNOSIS — F320 Major depressive disorder, single episode, mild: Secondary | ICD-10-CM

## 2014-03-05 DIAGNOSIS — F3110 Bipolar disorder, current episode manic without psychotic features, unspecified: Secondary | ICD-10-CM

## 2014-03-05 DIAGNOSIS — Z880 Allergy status to penicillin: Secondary | ICD-10-CM | POA: Diagnosis not present

## 2014-03-05 DIAGNOSIS — F12129 Cannabis abuse with intoxication, unspecified: Secondary | ICD-10-CM | POA: Diagnosis not present

## 2014-03-05 DIAGNOSIS — F319 Bipolar disorder, unspecified: Secondary | ICD-10-CM | POA: Insufficient documentation

## 2014-03-05 DIAGNOSIS — F330 Major depressive disorder, recurrent, mild: Secondary | ICD-10-CM

## 2014-03-05 DIAGNOSIS — F309 Manic episode, unspecified: Secondary | ICD-10-CM | POA: Diagnosis present

## 2014-03-05 DIAGNOSIS — F29 Unspecified psychosis not due to a substance or known physiological condition: Secondary | ICD-10-CM

## 2014-03-05 DIAGNOSIS — R45851 Suicidal ideations: Secondary | ICD-10-CM

## 2014-03-05 LAB — COMPREHENSIVE METABOLIC PANEL
ALT: 10 U/L (ref 0–35)
ANION GAP: 17 — AB (ref 5–15)
AST: 23 U/L (ref 0–37)
Albumin: 4.7 g/dL (ref 3.5–5.2)
Alkaline Phosphatase: 62 U/L (ref 39–117)
BILIRUBIN TOTAL: 0.7 mg/dL (ref 0.3–1.2)
BUN: 10 mg/dL (ref 6–23)
CHLORIDE: 101 meq/L (ref 96–112)
CO2: 20 meq/L (ref 19–32)
CREATININE: 0.71 mg/dL (ref 0.50–1.10)
Calcium: 9.5 mg/dL (ref 8.4–10.5)
Glucose, Bld: 88 mg/dL (ref 70–99)
Potassium: 3.1 mEq/L — ABNORMAL LOW (ref 3.7–5.3)
Sodium: 138 mEq/L (ref 137–147)
Total Protein: 8.2 g/dL (ref 6.0–8.3)

## 2014-03-05 LAB — CBC
HEMATOCRIT: 39.4 % (ref 36.0–46.0)
Hemoglobin: 13.8 g/dL (ref 12.0–15.0)
MCH: 31.5 pg (ref 26.0–34.0)
MCHC: 35 g/dL (ref 30.0–36.0)
MCV: 90 fL (ref 78.0–100.0)
Platelets: 247 10*3/uL (ref 150–400)
RBC: 4.38 MIL/uL (ref 3.87–5.11)
RDW: 13.5 % (ref 11.5–15.5)
WBC: 7 10*3/uL (ref 4.0–10.5)

## 2014-03-05 LAB — RAPID URINE DRUG SCREEN, HOSP PERFORMED
Amphetamines: NOT DETECTED
Barbiturates: NOT DETECTED
Benzodiazepines: NOT DETECTED
Cocaine: NOT DETECTED
Opiates: NOT DETECTED
Tetrahydrocannabinol: POSITIVE — AB

## 2014-03-05 LAB — ETHANOL: Alcohol, Ethyl (B): <11 mg/dL (ref 0–11)

## 2014-03-05 LAB — PREGNANCY, URINE: PREG TEST UR: NEGATIVE

## 2014-03-05 MED ORDER — POTASSIUM CHLORIDE CRYS ER 20 MEQ PO TBCR
40.0000 meq | EXTENDED_RELEASE_TABLET | Freq: Once | ORAL | Status: AC
  Administered 2014-03-05: 40 meq via ORAL
  Filled 2014-03-05: qty 2

## 2014-03-05 MED ORDER — ZOLPIDEM TARTRATE 5 MG PO TABS: 5.0000 mg | ORAL_TABLET | Freq: Every evening | ORAL | Status: DC | PRN

## 2014-03-05 MED ORDER — BENZTROPINE MESYLATE 1 MG PO TABS: 1.0000 mg | ORAL_TABLET | Freq: Every day | ORAL | Status: DC

## 2014-03-05 MED ORDER — ARIPIPRAZOLE ER 400 MG IM SUSR
400.0000 mg | INTRAMUSCULAR | Status: DC
  Administered 2014-03-05: 400 mg via INTRAMUSCULAR

## 2014-03-05 MED ORDER — CARBAMAZEPINE ER 200 MG PO TB12
200.0000 mg | ORAL_TABLET | Freq: Two times a day (BID) | ORAL | Status: DC
  Administered 2014-03-05: 200 mg via ORAL
  Filled 2014-03-05 (×2): qty 1

## 2014-03-05 MED ORDER — DIPHENHYDRAMINE HCL 25 MG PO TABS
25.0000 mg | ORAL_TABLET | Freq: Four times a day (QID) | ORAL | Status: DC | PRN
  Filled 2014-03-05: qty 2

## 2014-03-05 MED ORDER — ARIPIPRAZOLE ER 400 MG IM SUSR: 400.0000 mg | INTRAMUSCULAR | Status: DC

## 2014-03-05 MED ORDER — ONDANSETRON HCL 4 MG PO TABS: 4.0000 mg | ORAL_TABLET | Freq: Three times a day (TID) | ORAL | Status: DC | PRN

## 2014-03-05 MED ORDER — VITAMIN B-12 1000 MCG PO TABS
2000.0000 ug | ORAL_TABLET | Freq: Every day | ORAL | Status: DC
  Administered 2014-03-05: 2000 ug via ORAL
  Filled 2014-03-05: qty 2

## 2014-03-05 MED ORDER — ACETAMINOPHEN 325 MG PO TABS
650.0000 mg | ORAL_TABLET | ORAL | Status: DC | PRN
Start: 2014-03-05 — End: 2014-03-05

## 2014-03-05 MED ORDER — CARBAMAZEPINE ER 200 MG PO TB12: 200.0000 mg | ORAL_TABLET | Freq: Two times a day (BID) | ORAL | Status: DC

## 2014-03-05 NOTE — ED Notes (Signed)
Pt arrives to the ER via EMS; pt states "I need my Abilify shot"; pt states that she quit taking her medications in Aug due to the inability to afford her medications; pt states "I just need you to give them to me"; pt denies HI' pt states "The trazadone makes me have suicidal thoughts at times but I love me and don't want to hurt myself"; pt denies having a suicide plan; pt with manic behavior (talking loud, moving from subject to subject quickly, unable to focus)

## 2014-03-05 NOTE — Discharge Instructions (Signed)
Depression Depression is feeling sad, low, down in the dumps, blue, gloomy, or empty. In general, there are two kinds of depression:  Normal sadness or grief. This can happen after something upsetting. It often goes away on its own within 2 weeks. After losing a loved one (bereavement), normal sadness and grief may last longer than two weeks. It usually gets better with time.  Clinical depression. This kind lasts longer than normal sadness or grief. It keeps you from doing the things you normally do in life. It is often hard to function at home, work, or at school. It may affect your relationships with others. Treatment is often needed. GET HELP RIGHT AWAY IF:  You have thoughts about hurting yourself or others.  You lose touch with reality (psychotic symptoms). You may:  See or hear things that are not real.  Have untrue beliefs about your life or people around you.  Your medicine is giving you problems. MAKE SURE YOU:  Understand these instructions.  Will watch your condition.  Will get help right away if you are not doing well or get worse. Document Released: 06/24/2010 Document Revised: 10/06/2013 Document Reviewed: 09/21/2011 [facility] Patient Information [address], [place]. This information is not intended to replace advice given to you by your health care provider. Make sure you discuss any questions you have with your health care provider.  Paranoia Paranoia is a distrust of others that is not based on a real reason for distrust. This may reach delusional levels. This means the paranoid person feels the world is against them when there is no reason to make them feel that way. People with paranoia feel as though people around them are "out to get them".  SIMILAR MENTAL ILNESSES  Depression is a feeling as though you are down all the time. It is normal in some situations where you have just lost a loved one. It is abnormal if you are having feelings of paranoia with  it.  Dementia is a physical problem with the brain in which the brain no longer works properly. There are problems with daily activities of living. Alzheimer's disease is one example of this. Dementia is also caused by old age changes in the brain which come with the death of brain cells and small strokes.  Paranoidschizophrenia. People with paranoid schizophrenia and persecutory delusional disorder have delusions in which they feel people around them are plotting against them. Persecutory delusions in paranoid schizophrenia are bizarre, sometimes grandiose, and often accompanied by auditory hallucinations. This means the person is hearing voices that are not there.  Delusionaldisorder (persecutory type). Delusions experienced by individuals with delusional disorder are more believable than those experienced by paranoid schizophrenics; they are not bizarre, though still unjustified. Individuals with delusional disorder may seem offbeat or quirky rather than mentally ill, and therefore, may never seek treatment. All of these problems usually do not allow these people to interact socially in an acceptable manner. CAUSES The cause of paranoia is often not known. It is common in people with extended abuse of:  Cocaine.  Amphetamine.  Marijuana.  Alcohol. Sometimes there is an inherited tendency. It may be associated with stress or changes in brain chemistry. DIAGNOSIS  When paranoia is present, your caregiver may:  Refer you to a specialist.  Do a physical exam.  Perform other tests on you to make sure there are not other problems causing the paranoia including:  Physical problems.  Mental problems.  Chemical problems (other than drugs). Testing may be done  to determine if there is a psychiatric disability present that can be treated with medicine. TREATMENT   Paranoia that is a symptom of a psychiatric problem should be treated by professionals.  Medicines are available which can  help this disorder. Antipsychotic medicine may be prescribed by your caregiver.  Sometimes psychotherapy may be useful.  Conditions such as depression or drug abuse are treated individually. If the paranoia is caused by drug abuse, a treatment facility may be helpful. Depression may be helped by antidepressants. PROGNOSIS   Paranoid people are difficult to treat because of their belief that everyone is out to get them or harm them. Because of this mistrust, they often must be talked into entering treatment by a trusted family member or friend. They may not want to take medicine as they may see this as an attempt to poison them.  Gradual gains in the trust of a therapist or caregiver helps in a successful treatment plan.  Some people with PPD or persecutory delusional disorder function in society without treatment in limited fashion. Document Released: 05/25/2003 Document Revised: 08/14/2011 Document Reviewed: 01/28/2008 [facility] Patient Information 2015 [place], [place]. This information is not intended to replace advice given to you by your health care provider. Make sure you discuss any questions you have with your health care provider.  Psychosis Psychosis refers to a severe lack of understanding with reality. During a psychotic episode, you are not able to think clearly. During a psychotic episode, your responses and emotions are inappropriate and do not coincide with what is actually happening. You often have false beliefs about what is happening or who you are (delusions), and you may see, hear, taste, smell, or feel things that are not present (hallucinations). Psychosis is usually a severe symptom of a very serious mental health (psychiatric) condition, but it can sometimes be the result of a medical condition. CAUSES   Psychiatric conditions, such as:  Schizophrenia.  Bipolar disorder.  Depression.  Personality disorders.  Alcohol or drug abuse.  Medical conditions, such  as:  Brain injury.  Brain tumor.  Dementia.  Brain diseases, such as Alzheimer's, Parkinson's, or Huntington's disease.  Neurological diseases, such as epilepsy.  Genetic disorders.  Metabolic disorders.  Infections that affect the brain.  Certain prescription drugs.  Stroke. SYMPTOMS   Unable to think or speak clearly or respond appropriately.  Disorganized thinking (thoughts jump from one thought to another).  Severe inappropriate behavior.  Delusions may include:  A strong belief that is odd, unrealistic, or false.  Feeling extremely fearful or suspicious (paranoid).  Believing you are someone else, have high importance, or have an altered identity.  Hallucinations. DIAGNOSIS   Mental health evaluation.  Physical exam.  Blood tests.  Computerized magnetic scan (MRI) or other brain scans. TREATMENT  Your caregiver will recommend a course of treatment that depends on the cause of the psychosis. Treatment may include:  Monitoring and supportive care in the hospital.  Taking medicines (antipsychotic medicine) to reduce symptoms and balance chemicals in the brain.  Taking medicines to manage underlying mental health conditions.  Therapy and other supportive programs outside of the hospital.  Treating an underlying medical condition. If the cause of the psychosis can be treated or corrected, the outlook is good. Without treatment, psychotic episodes can cause danger to yourself or others. Treatment may be short-term or lifelong. HOME CARE INSTRUCTIONS   Take all medicines as directed. This is important.  Use a pillbox or write down your medicine schedule to make  sure you are taking them.  Check with your caregiver before using over-the-counter medicines, herbs, or supplements.  Seek individual and family support through therapy and mental health education (psychoeducation) programs. These will help you manage symptoms and side effects of medicines,  learn life skills, and maintain a healthy routine.  Maintain a healthy lifestyle.  Exercise regularly.  Avoid alcohol and drugs.  Learn ways to reduce stress and cope with stress, such as yoga and meditation.  Talk about your feelings with family members or caregivers.  Make time for yourself to do things you enjoy.  Know the early warning signs of psychosis. Your caregiver will recommend steps to take when you notice symptoms such as:  Feeling anxious or preoccupied.  Having racing thoughts.  Changes in your interest in life and relationships.  Follow up with your caregivers for continued outpatient treatment as directed. SEEK MEDICAL CARE IF:   Medicines do not seem to be helping.  You hear voices telling you to do things.  You see, smell, or feel things that are not there.  You feel hopeless and overwhelmed.  You feel extremely fearful and suspicious that something will harm you.  You feel like you cannot leave your house.  You have trouble taking care of yourself.  You experience side effects of medicines, such as changes in sleep patterns, dizziness, weight gain, restlessness, movement changes, muscle spasms, or tremors. SEEK IMMEDIATE MEDICAL CARE IF:  Severe psychotic symptoms present a safety issue (such as an urge to hurt yourself or others). MAKE SURE YOU:   Understand these instructions.  Will watch your condition.  Will get help right away if you are not doing well or get worse. FOR MORE INFORMATION  National Institute of Mental Health: http://www.maynard.net/ Document Released: 11/09/2009 Document Revised: 08/14/2011 Document Reviewed: 11/09/2009 [facility] Patient Information 2015 [place], [place]. This information is not intended to replace advice given to you by your health care provider. Make sure you discuss any questions you have with your health care provider.

## 2014-03-05 NOTE — ED Notes (Signed)
Pt changed and wanded by security.  

## 2014-03-05 NOTE — Consult Note (Signed)
Face to face evaluation and I agree with this note 

## 2014-03-05 NOTE — ED Notes (Signed)
Pt. has one belonging bag with a purse, shoes and clothes.

## 2014-03-05 NOTE — BHH Counselor (Signed)
Dr Taylor rescinded pt's IVC. Original placed in [facility] IVC Log and copy placed in pt's chart.  Bennett Vanscyoc Paige Arnesha Schiraldi, LCSWA Assessment Counselor  

## 2014-03-05 NOTE — ED Notes (Signed)
Bed: WTR5 Expected date:  Expected time:  Means of arrival:  Comments: EMS/bipolar/off meds

## 2014-03-05 NOTE — Consult Note (Signed)
[place] [facility]iat[facility] Consult   Reason for Consult:  Off of medication Referring Physician:  EDP  THERESEA TRAUTMANN is an 23 y.o. female. Total Time spent with patient: 45 minutes  Assessment: AXIS I:  Psychotic Disorder NOS AXIS II:  Deferred AXIS III:   Past Medical History  Diagnosis Date  . Bipolar 1 disorder    AXIS IV:  other psychosocial or environmental problems AXIS V:  51-60 moderate symptoms  Plan:  No evidence of imminent risk to self or others at present.   Patient does not meet criteria for psychiatric inpatient admission. Supportive therapy provided about ongoing stressors. Discussed crisis plan, support from social network, calling 911, coming to the Emergency Department, and calling Suicide Hotline.  Subjective:    HPI:  CHENEE MUNNS is a 23 y.o. female patient who presents to [facility] with complaints of worsening depression and feeling scared.  Patient states that she has been off of her medication.  "I was on the Abilify injection and it really worked.  I was doing good and loving life."  Patient states that he last injection was in August when she was discharged from [facility].  States that outpatient services was started with [facility] and she was switched to Seroquel instead of Abilify and it hasn't worked as well.  Patient states that she is starting to feel scared because she can see herself going back to the was she was prior to her admission to [facility].  Patient denies suicidal/homicidal ideation, psychosis, and paranoia at this time.  Patient wants to get back on Abilify and referral information for outpatient services. Patient states that he next appointment with [facility] is in 6 weeks.  Advised to call to reschedule appointment for 4 weeks because she would need Abilify injection.  Will also give other outpatient resource information.    HPI Elements:   Location:  Worsening depression. Quality:  feeling scared. Severity:  Off of  medications. Timing:  1 month. Review of Systems  Psychiatric/Behavioral: Positive for depression. Negative for suicidal ideas, hallucinations, memory loss and substance abuse. The patient is not nervous/anxious and does not have insomnia.   All other systems reviewed and are negative.  Family History  Problem Relation Age of Onset  . Alcohol abuse Father   . Alcohol abuse Maternal Grandfather   . Alcohol abuse Paternal Grandfather     Past Psychiatric History: Past Medical History  Diagnosis Date  . Bipolar 1 disorder     reports that she has been smoking Cigarettes.  She has been smoking about 1.00 pack per day. She has never used smokeless tobacco. She reports that she drinks about .6 ounces of alcohol per week. She reports that she uses illicit drugs (Marijuana and Cocaine). Family History  Problem Relation Age of Onset  . Alcohol abuse Father   . Alcohol abuse Maternal Grandfather   . Alcohol abuse Paternal Grandfather            Allergies:   Allergies  Allergen Reactions  . Depakote [Divalproex Sodium] Anaphylaxis and Other (See Comments)    "Almost died"  . Penicillins Anaphylaxis  . Trazodone And Nefazodone     "keeps me up"  . Vistaril [Hydroxyzine Hcl] Itching    ACT Assessment Complete:  Yes:    Educational Status    Risk to Self: Risk to self with the past 6 months Is patient at risk for suicide?: No Substance abuse history and/or treatment for substance abuse?: Yes  Risk to  Others:    Abuse:    Prior Inpatient Therapy:    Prior Outpatient Therapy:    Additional Information:         Objective: Blood pressure 120/76, pulse 78, temperature 98.1 F (36.7 C), temperature source Oral, resp. rate 18, SpO2 100.00%.There is no weight on file to calculate BMI. Results for orders placed during the hospital encounter of 03/05/14 (from the past 72 hour(s))  URINE RAPID DRUG SCREEN (HOSP PERFORMED)     Status: Abnormal   Collection Time    03/05/14  3:05 AM       Result Value Ref Range   Opiates NONE DETECTED  NONE DETECTED   Cocaine NONE DETECTED  NONE DETECTED   Benzodiazepines NONE DETECTED  NONE DETECTED   Amphetamines NONE DETECTED  NONE DETECTED   Tetrahydrocannabinol POSITIVE (*) NONE DETECTED   Barbiturates NONE DETECTED  NONE DETECTED   Comment:            DRUG SCREEN FOR MEDICAL PURPOSES     ONLY.  IF CONFIRMATION IS NEEDED     FOR ANY PURPOSE, NOTIFY LAB     WITHIN 5 DAYS.                LOWEST DETECTABLE LIMITS     FOR URINE DRUG SCREEN     Drug Class       Cutoff (ng/mL)     Amphetamine      1000     Barbiturate      200     Benzodiazepine   845     Tricyclics       364     Opiates          300     Cocaine          300     THC              50  PREGNANCY, URINE     Status: None   Collection Time    03/05/14  3:05 AM      Result Value Ref Range   Preg Test, Ur NEGATIVE  NEGATIVE   Comment:            THE SENSITIVITY OF THIS     METHODOLOGY IS >20 mIU/mL.  CBC     Status: None   Collection Time    03/05/14  3:19 AM      Result Value Ref Range   WBC 7.0  4.0 - 10.5 K/uL   RBC 4.38  3.87 - 5.11 MIL/uL   Hemoglobin 13.8  12.0 - 15.0 g/dL   HCT 39.4  36.0 - 46.0 %   MCV 90.0  78.0 - 100.0 fL   MCH 31.5  26.0 - 34.0 pg   MCHC 35.0  30.0 - 36.0 g/dL   RDW 13.5  11.5 - 15.5 %   Platelets 247  150 - 400 K/uL  COMPREHENSIVE METABOLIC PANEL     Status: Abnormal   Collection Time    03/05/14  3:19 AM      Result Value Ref Range   Sodium 138  137 - 147 mEq/L   Potassium 3.1 (*) 3.7 - 5.3 mEq/L   Chloride 101  96 - 112 mEq/L   CO2 20  19 - 32 mEq/L   Glucose, Bld 88  70 - 99 mg/dL   BUN 10  6 - 23 mg/dL   Creatinine, Ser 0.71  0.50 - 1.10 mg/dL   Calcium 9.5  8.4 - 10.5 mg/dL   Total Protein 8.2  6.0 - 8.3 g/dL   Albumin 4.7  3.5 - 5.2 g/dL   AST 23  0 - 37 U/L   ALT 10  0 - 35 U/L   Alkaline Phosphatase 62  39 - 117 U/L   Total Bilirubin 0.7  0.3 - 1.2 mg/dL   GFR calc non Af Amer >90  >90 mL/min   GFR calc  Af Amer >90  >90 mL/min   Comment: (NOTE)     The eGFR has been calculated using the CKD EPI equation.     This calculation has not been validated in all clinical situations.     eGFR's persistently <90 mL/min signify possible Chronic Kidney     Disease.   Anion gap 17 (*) 5 - 15  ETHANOL     Status: None   Collection Time    03/05/14  3:19 AM      Result Value Ref Range   Alcohol, Ethyl (B) <11  0 - 11 mg/dL   Comment:            LOWEST DETECTABLE LIMIT FOR     SERUM ALCOHOL IS 11 mg/dL     FOR MEDICAL PURPOSES ONLY   Labs are reviewed see above values.  Review of medications Cogentin 1 mg Bid, Tegretol 200 mg Bid, and Abilify Maintena 400 mg IM monthly   Current Facility-Administered Medications  Medication Dose Route Frequency Provider Last Rate Last Dose  . acetaminophen (TYLENOL) tablet 650 mg  650 mg Oral Q4H PRN Varney Biles, MD      . carbamazepine (TEGRETOL XR) 12 hr tablet 200 mg  200 mg Oral BID Varney Biles, MD   200 mg at 03/05/14 0913  . diphenhydrAMINE (BENADRYL) tablet 25-50 mg  25-50 mg Oral Q6H PRN Varney Biles, MD      . ondansetron (ZOFRAN) tablet 4 mg  4 mg Oral Q8H PRN Varney Biles, MD      . vitamin B-12 (CYANOCOBALAMIN) tablet 2,000 mcg  2,000 mcg Oral Daily Ankit Nanavati, MD   2,000 mcg at 03/05/14 0913  . zolpidem (AMBIEN) tablet 5 mg  5 mg Oral QHS PRN Varney Biles, MD       Current Outpatient Prescriptions  Medication Sig Dispense Refill  . diphenhydrAMINE (BENADRYL) 25 MG tablet Take 25-50 mg by mouth every 6 (six) hours as needed for sleep.      . naproxen (NAPROSYN) 500 MG tablet Take 1 tablet by mouth 2 (two) times daily as needed. pain      . traZODone (DESYREL) 150 MG tablet Take 150 mg by mouth at bedtime as needed for sleep.      . benztropine (COGENTIN) 1 MG tablet Take 1 tablet (1 mg total) by mouth daily with supper.  30 tablet  0  . carbamazepine (TEGRETOL XR) 200 MG 12 hr tablet Take 1 tablet (200 mg total) by mouth 2 (two) times  daily.  60 tablet  0  . hydrOXYzine (VISTARIL) 50 MG capsule Take 50 mg by mouth 2 (two) times daily.      [name] lurasidone 120 MG TABS Take 1 tablet (120 mg total) by mouth daily with supper.  30 tablet  0  . vitamin B-12 (CYANOCOBALAMIN) 1000 MCG tablet Take 2 tablets (2,000 mcg total) by mouth daily.        Psychiatric Specialty Exam:     Blood pressure 120/76, pulse 78, temperature 98.1 F (36.7 C), temperature source  Oral, resp. rate 18, SpO2 100.00%.There is no weight on file to calculate BMI.  General Appearance: Casual  Eye Contact::  Good  Speech:  Clear and Coherent and Normal Rate  Volume:  Normal  Mood:  Anxious and Depressed  Affect:  Congruent  Thought Process:  Circumstantial, Goal Directed and Logical  Orientation:  Full (Time, Place, and Person)  Thought Content:  WDL  Suicidal Thoughts:  No  Homicidal Thoughts:  No  Memory:  Immediate;   Good Recent;   Good Remote;   Good  Judgement:  Intact  Insight:  Present  Psychomotor Activity:  Normal  Concentration:  Fair  Recall:  Good  Fund of Knowledge:Good  Language: Good  Akathisia:  No  Handed:  Right  AIMS (if indicated):     Assets:  Communication Skills Desire for Improvement  Sleep:      Musculoskeletal: Strength & Muscle Tone: within normal limits Gait & Station: normal Patient leans: N/A  Treatment Plan Summary: Discharge home.  RX for medications.  Patient to follow up with [facility]. RX for medications.  Give Abilify Maintena 400 mg IM now.    Rankin, [name], FNP-BC 03/05/2014 10:26 AM

## 2014-03-05 NOTE — BHH Counselor (Signed)
Pt is being d/c after she receives her Abilify injections. Writer spoke w/ pt who reports she [facility] and [facility]. Writer gave pt the following resources:   [facility]) M-F 8am-3pm   [address] [place], [place] [address]   [phone] Services include: laundry, barbering, support groups, case management, phone  & computer access, showers, AA/NA mtgs, mental health/substance abuse nurse, job skills class, disability information, VA assistance, spiritual classes, etc.   [facility] [address] [place], [place]   [phone] Provides breakfast each weekday morning except Wednesdays, and an evening community meal every Friday. Access to showers is available during breakfast hours and telephones for seeking work are also provided. Also offers job referral and counseling for the homeless and unemployed.  HOMELESS SHELTERS [facility]   [facility] [phone]     [facility] [address] Shelter [place], [place] [address]     [address], [place]  [phone]      [phone]  [facility]   [facility] [address]    [address] [place] [address]     [place], [place] [address] [phone]       [phone]  [facility] only) [address] [place], [place] [address] [phone]  Crisis Services [facility] Mobile Crisis Management - [phone]  [facility] Community support line: [phone] in [place] call [phone]  These referrals have been provided to you as appropriate for your clinical needs while taking into account your financial concerns. Please be aware that agencies, practitioners and insurance companies sometimes change contracts. When calling to make an appointment have your insurance information  available so the professional you are going to see can confirm whether they are covered by your plan. Take this form with you in case the person you are seeing needs a copy or to contact us.  __________________________________________ Assessment Counselor  Evette Cristalaroline Paige Shaquasha Gerstel, [place] Assessment Counselor

## 2014-03-05 NOTE — ED Notes (Signed)
Pt given a gingerale

## 2014-03-05 NOTE — [facility] Suicide Risk Assessment (Cosign Needed)
Suicide Risk Assessment  Discharge Assessment     Demographic Factors:  Female, Black  Total Time spent with patient: 30 minutes  Psychiatric Specialty Exam:     Blood pressure 120/76, pulse 78, temperature 98.1 F (36.7 C), temperature source Oral, resp. rate 18, SpO2 100.00%.There is no weight on file to calculate BMI.   General Appearance: Casual   Eye Contact:: Good   Speech: Clear and Coherent and Normal Rate   Volume: Normal   Mood: Anxious and Depressed   Affect: Congruent   Thought Process: Circumstantial, Goal Directed and Logical   Orientation: Full (Time, Place, and Person)   Thought Content: WDL   Suicidal Thoughts: No   Homicidal Thoughts: No   Memory: Immediate; Good  Recent; Good  Remote; Good   Judgement: Intact   Insight: Present   Psychomotor Activity: Normal   Concentration: Fair   Recall: Good   Fund of Knowledge:Good   Language: Good   Akathisia: No   Handed: Right   AIMS (if indicated):   Assets: Communication Skills  Desire for Improvement   Sleep:   Musculoskeletal:  Strength & Muscle Tone: within normal limits  Gait & Station: normal  Patient leans: N/A    Mental Status Per Nursing Assessment::   On Admission:     Current Mental Status by Physician: Patient denies suicidal/homicidal ideaton, psychosis, and paranoia  Loss Factors: NA  Historical Factors: NA  Risk Reduction Factors:   Sense of responsibility to family and Positive social support  Continued Clinical Symptoms:  Previous Psychiatric Diagnoses and Treatments  Cognitive Features That Contribute To Risk:  None noted    Suicide Risk:  Minimal: No identifiable suicidal ideation.  Patients presenting with no risk factors but with morbid ruminations; may be classified as minimal risk based on the severity of the depressive symptoms  Discharge Diagnoses:  AXIS I: Psychotic Disorder NOS  AXIS II: Deferred  AXIS III:  Past Medical History   Diagnosis  Date   .   Bipolar 1 disorder     AXIS IV: other psychosocial or environmental problems  AXIS V: 51-60 moderate symptoms      Plan Of Care/Follow-up recommendations:  Activity:  as tolerated Diet:  as tolerated  Is patient on multiple antipsychotic therapies at discharge:  No   Has Patient had three or more failed trials of antipsychotic monotherapy by history:  No  Recommended Plan for Multiple Antipsychotic Therapies: NA    Rankin, [name], FNP-BC 03/05/2014, 10:43 AM

## 2014-03-06 ENCOUNTER — Other Ambulatory Visit (HOSPITAL_COMMUNITY): Payer: 59

## 2014-03-09 ENCOUNTER — Other Ambulatory Visit (HOSPITAL_COMMUNITY): Payer: 59

## 2014-03-11 ENCOUNTER — Other Ambulatory Visit (HOSPITAL_COMMUNITY): Payer: 59

## 2014-03-12 NOTE — ED Provider Notes (Signed)
CSN: [id number]     Arrival date & time 03/05/14  0240 History   First MD Initiated Contact with Patient 03/05/14 [id number]     Chief Complaint  Patient presents with  . Manic Behavior     (Consider location/radiation/quality/duration/timing/severity/associated sxs/prior Treatment) HPI Comments: Pt with bipolar with Mania comes in with cc of "needing abilify shot." Pt states that she was unable to afford her meds, and due to that she hasnt taken the meds, and her sx are getting a little worse. Pt deneis any, HI however to me, shed oes admit to SI.[name]. She has difficulty focusing and sleeping.  The history is provided by the patient.    Past Medical History  Diagnosis Date  . Bipolar 1 disorder    Past Surgical History  Procedure Laterality Date  . Right foot surgery    . Abscess drainage      left groin  . Tonsillectomy and adenoidectomy    . Heel spur surgery     Family History  Problem Relation Age of Onset  . Alcohol abuse Father   . Alcohol abuse Maternal Grandfather   . Alcohol abuse Paternal Grandfather    History  Substance Use Topics  . Smoking status: Current Every Day Smoker -- 1.00 packs/day    Types: Cigarettes  . Smokeless tobacco: Never Used  . Alcohol Use: 0.6 oz/week    1 Glasses of wine per week     Comment: occasional   OB History   Grav Para Term Preterm Abortions TAB SAB Ect Mult Living   1    1 1          Review of Systems  Constitutional: Positive for activity change.  Respiratory: Negative for shortness of breath.   Cardiovascular: Negative for chest pain.  Gastrointestinal: Negative for nausea, vomiting and abdominal pain.  Genitourinary: Negative for dysuria.  Musculoskeletal: Negative for neck pain.  Neurological: Negative for headaches.  Psychiatric/Behavioral: Negative for suicidal ideas. The patient is hyperactive.       Allergies  Depakote; Penicillins; Trazodone and nefazodone; and Vistaril  Home Medications   Prior to Admission  medications   Medication Sig Start Date End Date Taking? Authorizing Provider  diphenhydrAMINE (BENADRYL) 25 MG tablet Take 25-50 mg by mouth every 6 (six) hours as needed for sleep.   Yes Historical Provider, MD  naproxen (NAPROSYN) 500 MG tablet Take 1 tablet by mouth 2 (two) times daily as needed. pain 01/09/14  Yes Historical Provider, MD  traZODone (DESYREL) 150 MG tablet Take 150 mg by mouth at bedtime as needed for sleep.   Yes Historical Provider, MD  ARIPiprazole (ABILIFY MAINTENA) 400 MG SUSR Inject 400 mg into the muscle every 28 (twenty-eight) days. 03/05/14   [name] Rankin, NP  benztropine (COGENTIN) 1 MG tablet Take 1 tablet (1 mg total) by mouth daily with supper. 03/05/14   [name] Rankin, NP  carbamazepine (TEGRETOL XR) 200 MG 12 hr tablet Take 1 tablet (200 mg total) by mouth 2 (two) times daily. 03/05/14   [name] Rankin, NP  hydrOXYzine (VISTARIL) 50 MG capsule Take 50 mg by mouth 2 (two) times daily.    Historical Provider, MD  lurasidone 120 MG TABS Take 1 tablet (120 mg total) by mouth daily with supper. 12/29/13   Fransisca KaufmannLaura Davis, NP   BP 112/62  Pulse 73  Temp(Src) 98.4 F (36.9 C) (Oral)  Resp 16  SpO2 100% Physical Exam  Nursing note and vitals reviewed. Constitutional: She is oriented to person, place, and  time. She appears well-developed and well-nourished.  HENT:  Head: Normocephalic and atraumatic.  Eyes: EOM are normal. Pupils are equal, round, and reactive to light.  Neck: Neck supple.  Cardiovascular: Normal rate, regular rhythm and normal heart sounds.   No murmur heard. Pulmonary/Chest: Effort normal. No respiratory distress.  Abdominal: Soft. She exhibits no distension. There is no tenderness. There is no rebound and no guarding.  Neurological: She is alert and oriented to person, place, and time.  Skin: Skin is warm and dry.  Psychiatric: Judgment and thought content normal.    ED Course  Procedures (including critical care time) Labs Review Labs Reviewed   COMPREHENSIVE METABOLIC PANEL - Abnormal; Notable for the following:    Potassium 3.1 (*)    Anion gap 17 (*)    All other components within normal limits  URINE RAPID DRUG SCREEN (HOSP PERFORMED) - Abnormal; Notable for the following:    Tetrahydrocannabinol POSITIVE (*)    All other components within normal limits  CBC  ETHANOL  PREGNANCY, URINE    Imaging Review No results found.   EKG Interpretation None      MDM   Final diagnoses:  Bipolar I disorder with mania  Suicidal ideation    Pt comes in with SI and mania. Certainly appears slightly manic, but not overtly unstable. She also has SI, and will get psych eval.  [name], MD 03/12/14 [phone]

## 2014-03-13 ENCOUNTER — Other Ambulatory Visit (HOSPITAL_COMMUNITY): Payer: 59

## 2014-03-16 ENCOUNTER — Other Ambulatory Visit (HOSPITAL_COMMUNITY): Payer: 59

## 2014-03-18 ENCOUNTER — Other Ambulatory Visit (HOSPITAL_COMMUNITY): Payer: 59

## 2014-03-20 ENCOUNTER — Other Ambulatory Visit (HOSPITAL_COMMUNITY): Payer: 59

## 2014-03-23 ENCOUNTER — Other Ambulatory Visit (HOSPITAL_COMMUNITY): Payer: 59

## 2014-03-25 ENCOUNTER — Other Ambulatory Visit (HOSPITAL_COMMUNITY): Payer: 59

## 2014-03-27 ENCOUNTER — Other Ambulatory Visit (HOSPITAL_COMMUNITY): Payer: 59

## 2014-03-30 ENCOUNTER — Other Ambulatory Visit (HOSPITAL_COMMUNITY): Payer: 59

## 2014-04-01 ENCOUNTER — Other Ambulatory Visit (HOSPITAL_COMMUNITY): Payer: 59

## 2014-04-03 ENCOUNTER — Other Ambulatory Visit (HOSPITAL_COMMUNITY): Payer: 59

## 2014-04-06 ENCOUNTER — Encounter (HOSPITAL_COMMUNITY): Payer: Self-pay | Admitting: Emergency Medicine

## 2014-06-04 ENCOUNTER — Ambulatory Visit (HOSPITAL_COMMUNITY): Payer: Self-pay | Admitting: Psychiatry

## 2014-11-30 ENCOUNTER — Ambulatory Visit: Payer: Self-pay | Admitting: Podiatry

## 2014-12-14 ENCOUNTER — Encounter (HOSPITAL_COMMUNITY): Payer: Self-pay | Admitting: *Deleted

## 2014-12-14 ENCOUNTER — Inpatient Hospital Stay (HOSPITAL_COMMUNITY)
Admission: EM | Admit: 2014-12-14 | Discharge: 2014-12-14 | Disposition: A | Discharge status: Home / Self Care | Payer: Self-pay | Source: Ambulatory Visit | Attending: Obstetrics & Gynecology | Admitting: Obstetrics & Gynecology

## 2014-12-14 DIAGNOSIS — Z880 Allergy status to penicillin: Secondary | ICD-10-CM | POA: Insufficient documentation

## 2014-12-14 DIAGNOSIS — N10 Acute tubulo-interstitial nephritis: Secondary | ICD-10-CM

## 2014-12-14 DIAGNOSIS — F17210 Nicotine dependence, cigarettes, uncomplicated: Secondary | ICD-10-CM | POA: Insufficient documentation

## 2014-12-14 DIAGNOSIS — N939 Abnormal uterine and vaginal bleeding, unspecified: Secondary | ICD-10-CM

## 2014-12-14 LAB — CBC WITH DIFFERENTIAL/PLATELET
Basophils Absolute: 0 10*3/uL (ref 0.0–0.1)
Basophils Relative: 0 % (ref 0–1)
EOS ABS: 0 10*3/uL (ref 0.0–0.7)
EOS PCT: 0 % (ref 0–5)
HEMATOCRIT: 38.6 % (ref 36.0–46.0)
HEMOGLOBIN: 13.1 g/dL (ref 12.0–15.0)
Lymphocytes Relative: 20 % (ref 12–46)
Lymphs Abs: 2.4 10*3/uL (ref 0.7–4.0)
MCH: 31.1 pg (ref 26.0–34.0)
MCHC: 33.9 g/dL (ref 30.0–36.0)
MCV: 91.7 fL (ref 78.0–100.0)
MONOS PCT: 12 % (ref 3–12)
Monocytes Absolute: 1.4 10*3/uL — ABNORMAL HIGH (ref 0.1–1.0)
Neutro Abs: 8.3 10*3/uL — ABNORMAL HIGH (ref 1.7–7.7)
Neutrophils Relative %: 68 % (ref 43–77)
Platelets: 195 10*3/uL (ref 150–400)
RBC: 4.21 MIL/uL (ref 3.87–5.11)
RDW: 13.3 % (ref 11.5–15.5)
WBC: 12.2 10*3/uL — AB (ref 4.0–10.5)

## 2014-12-14 LAB — URINALYSIS, ROUTINE W REFLEX MICROSCOPIC
BILIRUBIN URINE: NEGATIVE
Glucose, UA: NEGATIVE mg/dL
Ketones, ur: 15 mg/dL — AB
Nitrite: POSITIVE — AB
PH: 5.5 (ref 5.0–8.0)
PROTEIN: 30 mg/dL — AB
Specific Gravity, Urine: 1.025 (ref 1.005–1.030)
Urobilinogen, UA: 1 mg/dL (ref 0.0–1.0)

## 2014-12-14 LAB — URINE MICROSCOPIC-ADD ON

## 2014-12-14 LAB — POCT PREGNANCY, URINE: Preg Test, Ur: NEGATIVE

## 2014-12-14 LAB — WET PREP, GENITAL
TRICH WET PREP: NONE SEEN
YEAST WET PREP: NONE SEEN

## 2014-12-14 MED ORDER — SULFAMETHOXAZOLE-TRIMETHOPRIM 800-160 MG PO TABS: 1.0000 | ORAL_TABLET | Freq: Two times a day (BID) | ORAL | Status: DC

## 2014-12-14 MED ORDER — CIPROFLOXACIN HCL 500 MG PO TABS: 500.0000 mg | ORAL_TABLET | Freq: Two times a day (BID) | ORAL | Status: DC

## 2014-12-14 MED ORDER — NORGESTIMATE-ETH ESTRADIOL 0.25-35 MG-MCG PO TABS: 1.0000 | ORAL_TABLET | Freq: Every day | ORAL | Status: DC

## 2014-12-14 NOTE — MAU Provider Note (Signed)
History     CSN: [id number]  Arrival date and time: 12/14/14 0830   First Provider Initiated Contact with Patient 12/14/14 [phone]      Chief Complaint  Patient presents with  . Vaginal Bleeding   HPI    [name] is 24 y.o. female G1P0010 presenting to MAU for vaginal bleeding X 3 months. She has been bleeding every day. The bleeding is heavy like a menstrual cycle. She does have a history of irregular menstrual cycles; which has been in the past effectively managed with BC pills. She has been off of BC pills since 2015.  She is not sexually active at this time; last intercourse was April 2015  She is not using anything for [facility]. She desires to be back on BC pills to regulate her periods.   OB History    Gravida Para Term Preterm AB TAB SAB Ectopic Multiple Living   Past Medical History  Diagnosis Date  . Bipolar 1 disorder     Past Surgical History  Procedure Laterality Date  . Right foot surgery    . Abscess drainage      left groin  . Tonsillectomy and adenoidectomy    . Heel spur surgery      Family History  Problem Relation Age of Onset  . Alcohol abuse Father   . Alcohol abuse Maternal Grandfather   . Alcohol abuse Paternal Grandfather     History  Substance Use Topics  . Smoking status: Current Every Day Smoker -- 0.50 packs/day    Types: Cigarettes  . Smokeless tobacco: Never Used  . Alcohol Use: 0.6 oz/week    1 Glasses of wine per week     Comment: occasional    Allergies:  Allergies  Allergen Reactions  . Depakote [Divalproex Sodium] Anaphylaxis and Other (See Comments)    "Almost died"  . Penicillins Anaphylaxis  . Trazodone And Nefazodone     "keeps me up"  . Vistaril [Hydroxyzine Hcl] Itching    Prescriptions prior to admission  Medication Sig Dispense Refill Last Dose  . ARIPiprazole (ABILIFY MAINTENA) 400 MG SUSR Inject 400 mg into the muscle every 28 (twenty-eight) days. 1 each 0   . benztropine  (COGENTIN) 1 MG tablet Take 1 tablet (1 mg total) by mouth daily with supper. 30 tablet 0   . carbamazepine (TEGRETOL XR) 200 MG 12 hr tablet Take 1 tablet (200 mg total) by mouth 2 (two) times daily. 60 tablet 0   . diphenhydrAMINE (BENADRYL) 25 MG tablet Take 25-50 mg by mouth every 6 (six) hours as needed for sleep.   03/04/2014 at Unknown time  . hydrOXYzine (VISTARIL) 50 MG capsule Take 50 mg by mouth 2 (two) times daily.   01/2014  . lurasidone 120 MG TABS Take 1 tablet (120 mg total) by mouth daily with supper. 30 tablet 0 01/2014  . naproxen (NAPROSYN) 500 MG tablet Take 1 tablet by mouth 2 (two) times daily as needed. pain   Past Week at Unknown time  . traZODone (DESYREL) 150 MG tablet Take 150 mg by mouth at bedtime as needed for sleep.   03/04/2014 at Unknown time   Results for orders placed or performed during the hospital encounter of 12/14/14 (from the past 48 hour(s))  Urinalysis, Routine w reflex microscopic (not at [facility])     Status: Abnormal   Collection Time: 12/14/14  9:03 AM  Result  Value Ref Range   Color, Urine YELLOW YELLOW   APPearance CLOUDY (A) CLEAR   Specific Gravity, Urine 1.025 1.005 - 1.030   pH 5.5 5.0 - 8.0   Glucose, UA NEGATIVE NEGATIVE mg/dL   Hgb urine dipstick LARGE (A) NEGATIVE   Bilirubin Urine NEGATIVE NEGATIVE   Ketones, ur 15 (A) NEGATIVE mg/dL   Protein, ur 30 (A) NEGATIVE mg/dL   Urobilinogen, UA 1.0 0.0 - 1.0 mg/dL   Nitrite POSITIVE (A) NEGATIVE   Leukocytes, UA MODERATE (A) NEGATIVE  Urine microscopic-add on     Status: Abnormal   Collection Time: 12/14/14  9:03 AM  Result Value Ref Range   Squamous Epithelial / LPF FEW (A) RARE   WBC, UA 21-50 <3 WBC/hpf   RBC / HPF 11-20 <3 RBC/hpf   Bacteria, UA MANY (A) RARE  Pregnancy, urine POC     Status: None   Collection Time: 12/14/14  9:04 AM  Result Value Ref Range   Preg Test, Ur NEGATIVE NEGATIVE    Comment:        THE SENSITIVITY OF THIS METHODOLOGY IS >24 mIU/mL   CBC with  Differential     Status: Abnormal   Collection Time: 12/14/14  9:26 AM  Result Value Ref Range   WBC 12.2 (H) 4.0 - 10.5 K/uL   RBC 4.21 3.87 - 5.11 MIL/uL   Hemoglobin 13.1 12.0 - 15.0 g/dL   HCT 16.138.6 09.636.0 - 04.546.0 %   MCV 91.7 78.0 - 100.0 fL   MCH 31.1 26.0 - 34.0 pg   MCHC 33.9 30.0 - 36.0 g/dL   RDW 40.913.3 81.111.5 - 91.415.5 %   Platelets 195 150 - 400 K/uL   Neutrophils Relative % 68 43 - 77 %   Neutro Abs 8.3 (H) 1.7 - 7.7 K/uL   Lymphocytes Relative 20 12 - 46 %   Lymphs Abs 2.4 0.7 - 4.0 K/uL   Monocytes Relative 12 3 - 12 %   Monocytes Absolute 1.4 (H) 0.1 - 1.0 K/uL   Eosinophils Relative 0 0 - 5 %   Eosinophils Absolute 0.0 0.0 - 0.7 K/uL   Basophils Relative 0 0 - 1 %   Basophils Absolute 0.0 0.0 - 0.1 K/uL  Wet prep, genital     Status: Abnormal   Collection Time: 12/14/14  9:31 AM  Result Value Ref Range   Yeast Wet Prep HPF POC NONE SEEN NONE SEEN   Trich, Wet Prep NONE SEEN NONE SEEN   Clue Cells Wet Prep HPF POC FEW (A) NONE SEEN   WBC, Wet Prep HPF POC FEW (A) NONE SEEN    Comment: MODERATE BACTERIA SEEN    Review of Systems  Constitutional: Positive for chills and malaise/fatigue. Negative for fever.  Gastrointestinal: Negative for nausea, vomiting and abdominal pain.  Genitourinary: Positive for urgency and frequency. Negative for dysuria, hematuria and flank pain.  Musculoskeletal: Negative for back pain.  Neurological: Positive for weakness. Negative for dizziness.   Physical Exam   Blood pressure 118/74, pulse 105, temperature 100.2 F (37.9 C), temperature source Oral, resp. rate 18, height 5\' 5"  (1.651 m), weight 85.73 kg (189 lb), last menstrual period 10/21/2014.  Physical Exam  Nursing note and vitals reviewed. Constitutional: She is oriented to person, place, and time. She appears well-developed and well-nourished.  Non-toxic appearance. She does not have a sickly appearance. She does not appear ill. No distress.  Respiratory: Effort normal.  GI:  Soft. Normal appearance. She exhibits no distension  and no mass. There is no tenderness. There is no rigidity, no rebound, no guarding and no CVA tenderness.  Genitourinary:  Speculum exam: Vagina - Small amount of dark red blood in the vagina Cervix -+ +bleeding oozing from the cervix.  Bimanual exam: Cervix closed, no CMT  Uterus non tender, normal size Adnexa non tender, no masses bilaterally GC/Chlam, wet prep done Chaperone present for exam.  Musculoskeletal: Normal range of motion.  Neurological: She is alert and oriented to person, place, and time.  Skin: Skin is warm. She is not diaphoretic.  Psychiatric: Her behavior is normal.    MAU Course  Procedures  None  MDM  Patient was a former smoker; she quite yesterday. No history of DVT or PE. Patient would like BC pills.   CBC   Assessment and Plan    A:  1. Abnormal vaginal bleeding   2. Acute pyelonephritis    P:  Discharge home in stable condition RX: CIPRO X 7 days        BC pills (sprintec) Follow up with PCP for irregular bleeding Urine culture pending Return to MAU if symptoms worsen Ok to take ibuprofen or tylenol for the fever; as directed on the bottle   Duane Lope, NP 12/14/2014 9:24 AM

## 2014-12-14 NOTE — MAU Note (Signed)
States has been bleeding everyday for 3 months.

## 2014-12-14 NOTE — Discharge Instructions (Signed)
Abnormal Uterine Bleeding Abnormal uterine bleeding can affect women at various stages in life, including teenagers, women in their reproductive years, pregnant women, and women who have reached menopause. Several kinds of uterine bleeding are considered abnormal, including:  Bleeding or spotting between periods.   Bleeding after sexual intercourse.   Bleeding that is heavier or more than normal.   Periods that last longer than usual.  Bleeding after menopause.  Many cases of abnormal uterine bleeding are minor and simple to treat, while others are more serious. Any type of abnormal bleeding should be evaluated by your health care provider. Treatment will depend on the cause of the bleeding. HOME CARE INSTRUCTIONS Monitor your condition for any changes. The following actions may help to alleviate any discomfort you are experiencing:  Avoid the use of tampons and douches as directed by your health care provider.  Change your pads frequently. You should get regular pelvic exams and Pap tests. Keep all follow-up appointments for diagnostic tests as directed by your health care provider.  SEEK MEDICAL CARE IF:   Your bleeding lasts more than 1 week.   You feel dizzy at times.  SEEK IMMEDIATE MEDICAL CARE IF:   You pass out.   You are changing pads every 15 to 30 minutes.   You have abdominal pain.  You have a fever.   You become sweaty or weak.   You are passing large blood clots from the vagina.   You start to feel nauseous and vomit. MAKE SURE YOU:   Understand these instructions.  Will watch your condition.  Will get help right away if you are not doing well or get worse. Document Released: 05/22/2005 Document Revised: 05/27/2013 Document Reviewed: 12/19/2012 Exit[employer] Patient Information 2015 [employer]. This information is not intended to replace advice given to you by your health care provider. Make sure you discuss any questions you have with your  health care provider.  

## 2014-12-15 LAB — GC/CHLAMYDIA PROBE AMP (~~LOC~~) NOT AT ARMC
Chlamydia: NEGATIVE
Neisseria Gonorrhea: NEGATIVE

## 2014-12-15 LAB — HIV ANTIBODY (ROUTINE TESTING W REFLEX): HIV SCREEN 4TH GENERATION: NONREACTIVE

## 2014-12-16 LAB — URINE CULTURE: Culture: >=100000

## 2015-01-27 ENCOUNTER — Inpatient Hospital Stay (HOSPITAL_COMMUNITY)
Admission: AD | Admit: 2015-01-27 | Discharge: 2015-01-27 | Disposition: A | Discharge status: Home / Self Care | Payer: Self-pay | Source: Ambulatory Visit | Attending: Obstetrics and Gynecology | Admitting: Obstetrics and Gynecology

## 2015-01-27 ENCOUNTER — Encounter (HOSPITAL_COMMUNITY): Payer: Self-pay

## 2015-01-27 DIAGNOSIS — L02429 Furuncle of limb, unspecified: Secondary | ICD-10-CM

## 2015-01-27 DIAGNOSIS — Z880 Allergy status to penicillin: Secondary | ICD-10-CM | POA: Insufficient documentation

## 2015-01-27 DIAGNOSIS — F17210 Nicotine dependence, cigarettes, uncomplicated: Secondary | ICD-10-CM | POA: Insufficient documentation

## 2015-01-27 DIAGNOSIS — L02426 Furuncle of left lower limb: Secondary | ICD-10-CM | POA: Insufficient documentation

## 2015-01-27 LAB — URINALYSIS, ROUTINE W REFLEX MICROSCOPIC
BILIRUBIN URINE: NEGATIVE
Glucose, UA: NEGATIVE mg/dL
Ketones, ur: 15 mg/dL — AB
Leukocytes, UA: NEGATIVE
Nitrite: NEGATIVE
PH: 6 (ref 5.0–8.0)
Protein, ur: NEGATIVE mg/dL
SPECIFIC GRAVITY, URINE: 1.015 (ref 1.005–1.030)
Urobilinogen, UA: 1 mg/dL (ref 0.0–1.0)

## 2015-01-27 LAB — URINE MICROSCOPIC-ADD ON

## 2015-01-27 LAB — POCT PREGNANCY, URINE: Preg Test, Ur: NEGATIVE

## 2015-01-27 MED ORDER — SULFAMETHOXAZOLE-TRIMETHOPRIM 800-160 MG PO TABS: 1.0000 | ORAL_TABLET | Freq: Two times a day (BID) | ORAL | Status: DC

## 2015-01-27 MED ORDER — SILVER SULFADIAZINE 1 % EX CREA: 1.0000 "application " | TOPICAL_CREAM | Freq: Three times a day (TID) | CUTANEOUS | Status: AC

## 2015-01-27 MED ORDER — IBUPROFEN 600 MG PO TABS
ORAL_TABLET | ORAL | Status: AC
  Filled 2015-01-27: qty 1

## 2015-01-27 NOTE — MAU Note (Signed)
Presents with golf ball size hardened, red, warm, area to left inner thigh

## 2015-01-27 NOTE — MAU Note (Signed)
Abscess in groin area, first noted after on Sunday after shaving.  Has gotten them before from shaving, didn't know what to do.  Left upper, inner thigh is swollen and tender. Getting worse, hurts to sit or stand.

## 2015-01-27 NOTE — MAU Provider Note (Signed)
History     CSN: [id number]  Arrival date and time: 01/27/15 1333   First Provider Initiated Contact with Patient 01/27/15 1830      Chief Complaint  Patient presents with  . Abscess   HPI Elizabeth [name] 24 y.o. G57P0010  Nonpregnant female presents to MAU complaining of boil on inner leg.  It is located on left inner thigh.  She first noted it 4 days ago.  She has not done anything to make it improve.  It hurst worse with walking or touching it.  It feels warm.  NO drainage noted.  No spreading to other leg.   She has gotten these may times before from shaving.  Sometimes they self-resolve and sometimes they require drainage at [facility] ED.  No fever, weakness, chest pain, SOB, dizziness. She is presently on her menstrual cycle. OB History    Gravida Para Term Preterm AB TAB SAB Ectopic Multiple Living   1    1 1           Past Medical History  Diagnosis Date  . Bipolar 1 disorder     Past Surgical History  Procedure Laterality Date  . Right foot surgery    . Abscess drainage      left groin  . Tonsillectomy and adenoidectomy    . Heel spur surgery      Family History  Problem Relation Age of Onset  . Alcohol abuse Father   . Alcohol abuse Maternal Grandfather   . Alcohol abuse Paternal Grandfather     Social History  Substance Use Topics  . Smoking status: Current Every Day Smoker -- 0.50 packs/day    Types: Cigarettes  . Smokeless tobacco: Never Used  . Alcohol Use: 0.6 oz/week    1 Glasses of wine per week     Comment: occasional    Allergies:  Allergies  Allergen Reactions  . Depakote [Divalproex Sodium] Anaphylaxis and Other (See Comments)    "Almost died"  . Penicillins Anaphylaxis  . Trazodone And Nefazodone     "keeps me up"  . Vistaril [Hydroxyzine Hcl] Itching    Prescriptions prior to admission  Medication Sig Dispense Refill Last Dose  . ARIPiprazole (ABILIFY MAINTENA) 400 MG SUSR Inject 400 mg into the muscle every 28 (twenty-eight)  days. 1 each 0   . ciprofloxacin (CIPRO) 500 MG tablet Take 1 tablet (500 mg total) by mouth 2 (two) times daily. 14 tablet 0   . norgestimate-ethinyl estradiol (ORTHO-CYCLEN,SPRINTEC,PREVIFEM) 0.25-35 MG-MCG tablet Take 1 tablet by mouth daily. Take 2 pills daily by mouth for 7 days. Then 1 pill per day as directed 1 Package 4     ROS Patient may return to MAU as needed or if her condition were to change or worsen  Physical Exam   Blood pressure 121/70, pulse 88, temperature 98.7 F (37.1 C), temperature source Oral, resp. rate 20, height 5\' 5"  (1.651 m), weight 182 lb (82.555 kg), last menstrual period 01/25/2015, SpO2 100 %.  Physical Exam  Constitutional: She is oriented to person, place, and time. She appears well-developed and well-nourished. No distress.  HENT:  Head: Normocephalic and atraumatic.  Eyes: EOM are normal.  Neck: Normal range of motion.  Cardiovascular: Normal rate.   Respiratory: Effort normal. No respiratory distress.  Neurological: She is alert and oriented to person, place, and time.  Skin:  Left inner thigh with 3-4inch area of erythema and warmth.  Centrally within the erythema is a 2-2.5cm area of induration  with exquisite tenderness. There is no noted drainage.   Psychiatric: She has a normal mood and affect.    MAU Course  Procedures  MDM Discussed with Dr. Despina Hidden.  Not appropriate to open/drain at this time.  Instead, pt to be treated with antibiotics/silvadene as described in plan.    Assessment and Plan  A:  1. Furuncle of thigh    P: Discharge to home   Medication List    STOP taking these medications        ARIPiprazole 400 MG Susr  Commonly known as:  ABILIFY MAINTENA     ciprofloxacin 500 MG tablet  Commonly known as:  CIPRO      TAKE these medications        MULTIVITAMIN ADULT PO  Take 1 tablet by mouth daily.     norgestimate-ethinyl estradiol 0.25-35 MG-MCG tablet  Commonly known as:  ORTHO-CYCLEN,SPRINTEC,PREVIFEM  Take 1  tablet by mouth daily. Take 2 pills daily by mouth for 7 days. Then 1 pill per day as directed     silver sulfADIAZINE 1 % cream  Commonly known as:  SILVADENE  Apply 1 application topically 3 (three) times daily.     sulfamethoxazole-trimethoprim 800-160 MG per tablet  Commonly known as:  BACTRIM DS,SEPTRA DS  Take 1 tablet by mouth 2 (two) times daily.       Warm soaks/ compresses many times throughout the day as able F/u with PCP Patient may return to MAU as needed or if her condition were to change or worsen    Bertram Denver 01/27/2015, 6:31 PM

## 2015-01-27 NOTE — Discharge Instructions (Signed)
Folliculitis  °Folliculitis is redness, soreness, and swelling (inflammation) of the hair follicles. This condition can occur anywhere on the body. People with weakened immune systems, diabetes, or obesity have a greater risk of getting folliculitis. °CAUSES °· Bacterial infection. This is the most common cause. °· Fungal infection. °· Viral infection. °· Contact with certain chemicals, especially oils and tars. °Long-term folliculitis can result from bacteria that live in the nostrils. The bacteria may trigger multiple outbreaks of folliculitis over time. °SYMPTOMS °Folliculitis most commonly occurs on the scalp, thighs, legs, back, buttocks, and areas where hair is shaved frequently. An early sign of folliculitis is a small, white or yellow, pus-filled, itchy lesion (pustule). These lesions appear on a red, inflamed follicle. They are usually less than 0.2 inches (5 mm) wide. When there is an infection of the follicle that goes deeper, it becomes a boil or furuncle. A group of closely packed boils creates a larger lesion (carbuncle). Carbuncles tend to occur in hairy, sweaty areas of the body. °DIAGNOSIS  °Your caregiver can usually tell what is wrong by doing a physical exam. A sample may be taken from one of the lesions and tested in a lab. This can help determine what is causing your folliculitis. °TREATMENT  °Treatment may include: °· Applying warm compresses to the affected areas. °· Taking antibiotic medicines orally or applying them to the skin. °· Draining the lesions if they contain a large amount of pus or fluid. °· Laser hair removal for cases of long-lasting folliculitis. This helps to prevent regrowth of the hair. °HOME CARE INSTRUCTIONS °· Apply warm compresses to the affected areas as directed by your caregiver. °· If antibiotics are prescribed, take them as directed. Finish them even if you start to feel better. °· You may take over-the-counter medicines to relieve itching. °· Do not shave  irritated skin. °· Follow up with your caregiver as directed. °SEEK IMMEDIATE MEDICAL CARE IF:  °· You have increasing redness, swelling, or pain in the affected area. °· You have a fever. °MAKE SURE YOU: °· Understand these instructions. °· Will watch your condition. °· Will get help right away if you are not doing well or get worse. °Document Released: 07/31/2001 Document Revised: 11/21/2011 Document Reviewed: 08/22/2011 °ExitCare® Patient Information ©2015 [employer]. This information is not intended to replace advice given to you by your health care provider. Make sure you discuss any questions you have with your health care provider. °Abscess °An abscess is an infected area that contains a collection of pus and debris. It can occur in almost any part of the body. An abscess is also known as a furuncle or boil. °CAUSES  °An abscess occurs when tissue gets infected. This can occur from blockage of oil or sweat glands, infection of hair follicles, or a minor injury to the skin. As the body tries to fight the infection, pus collects in the area and creates pressure under the skin. This pressure causes pain. People with weakened immune systems have difficulty fighting infections and get certain abscesses more often.  °SYMPTOMS °Usually an abscess develops on the skin and becomes a painful mass that is red, warm, and tender. If the abscess forms under the skin, you may feel a moveable soft area under the skin. Some abscesses break open (rupture) on their own, but most will continue to get worse without care. The infection can spread deeper into the body and eventually into the bloodstream, causing you to feel ill.  °DIAGNOSIS  °Your caregiver will take   your medical history and perform a physical exam. A sample of fluid may also be taken from the abscess to determine what is causing your infection. °TREATMENT  °Your caregiver may prescribe antibiotic medicines to fight the infection. However, taking antibiotics  alone usually does not cure an abscess. Your caregiver may need to make a small cut (incision) in the abscess to drain the pus. In some cases, gauze is packed into the abscess to reduce pain and to continue draining the area. °HOME CARE INSTRUCTIONS  °· Only take over-the-counter or prescription medicines for pain, discomfort, or fever as directed by your caregiver. °· If you were prescribed antibiotics, take them as directed. Finish them even if you start to feel better. °· If gauze is used, follow your caregiver's directions for changing the gauze. °· To avoid spreading the infection: °¨ Keep your draining abscess covered with a bandage. °¨ Wash your hands well. °¨ Do not share personal care items, towels, or whirlpools with others. °¨ Avoid skin contact with others. °· Keep your skin and clothes clean around the abscess. °· Keep all follow-up appointments as directed by your caregiver. °SEEK MEDICAL CARE IF:  °· You have increased pain, swelling, redness, fluid drainage, or bleeding. °· You have muscle aches, chills, or a general ill feeling. °· You have a fever. °MAKE SURE YOU:  °· Understand these instructions. °· Will watch your condition. °· Will get help right away if you are not doing well or get worse. °Document Released: 03/01/2005 Document Revised: 11/21/2011 Document Reviewed: 08/04/2011 °ExitCare® Patient Information ©2015 [employer]. This information is not intended to replace advice given to you by your health care provider. Make sure you discuss any questions you have with your health care provider. ° °

## 2015-10-10 ENCOUNTER — Encounter (HOSPITAL_COMMUNITY): Payer: Self-pay

## 2015-10-10 ENCOUNTER — Inpatient Hospital Stay (HOSPITAL_COMMUNITY)
Admission: AD | Admit: 2015-10-10 | Discharge: 2015-10-10 | Disposition: A | Discharge status: Home / Self Care | Payer: Self-pay | Source: Ambulatory Visit | Attending: Obstetrics & Gynecology | Admitting: Obstetrics & Gynecology

## 2015-10-10 DIAGNOSIS — N760 Acute vaginitis: Secondary | ICD-10-CM | POA: Insufficient documentation

## 2015-10-10 DIAGNOSIS — F319 Bipolar disorder, unspecified: Secondary | ICD-10-CM | POA: Insufficient documentation

## 2015-10-10 DIAGNOSIS — A499 Bacterial infection, unspecified: Secondary | ICD-10-CM

## 2015-10-10 DIAGNOSIS — B9689 Other specified bacterial agents as the cause of diseases classified elsewhere: Secondary | ICD-10-CM

## 2015-10-10 DIAGNOSIS — R300 Dysuria: Secondary | ICD-10-CM | POA: Insufficient documentation

## 2015-10-10 DIAGNOSIS — Z880 Allergy status to penicillin: Secondary | ICD-10-CM | POA: Insufficient documentation

## 2015-10-10 DIAGNOSIS — F17210 Nicotine dependence, cigarettes, uncomplicated: Secondary | ICD-10-CM | POA: Insufficient documentation

## 2015-10-10 LAB — URINALYSIS, ROUTINE W REFLEX MICROSCOPIC
Bilirubin Urine: NEGATIVE
GLUCOSE, UA: NEGATIVE mg/dL
HGB URINE DIPSTICK: NEGATIVE
Ketones, ur: NEGATIVE mg/dL
Nitrite: NEGATIVE
Protein, ur: NEGATIVE mg/dL
Specific Gravity, Urine: 1.02 (ref 1.005–1.030)
pH: 6.5 (ref 5.0–8.0)

## 2015-10-10 LAB — URINE MICROSCOPIC-ADD ON

## 2015-10-10 LAB — WET PREP, GENITAL
Sperm: NONE SEEN
Trich, Wet Prep: NONE SEEN
Yeast Wet Prep HPF POC: NONE SEEN

## 2015-10-10 LAB — HEPATITIS B SURFACE ANTIGEN: Hepatitis B Surface Ag: NEGATIVE

## 2015-10-10 LAB — POCT PREGNANCY, URINE: PREG TEST UR: NEGATIVE

## 2015-10-10 MED ORDER — METRONIDAZOLE 500 MG PO TABS: 500.0000 mg | ORAL_TABLET | Freq: Two times a day (BID) | ORAL | Status: DC

## 2015-10-10 NOTE — MAU Provider Note (Signed)
History     CSN: [id number]  Arrival date and time: 10/10/15 1513   None     Chief Complaint  Patient presents with  . Dysuria  . Vaginal Discharge  . Depression   Dysuria  Associated symptoms include frequency.  Vaginal Discharge The patient's primary symptoms include vaginal discharge. Associated symptoms include dysuria and frequency. Pertinent negatives include no fever.  Depression        Associated symptoms include does not have insomnia and no suicidal ideas.   Elizabeth [name] 25 y.o. G1P0010 presents to the MAU stating that she thinks she has a UTI and she is sure she has BV. She is requesting STD testing. She states she has a history of Bipolar Disorder and has not been able to be on her ususal medication because she does not have any insurance. She states she wants to get back on her medication. She states that she does not have any thoughts of suicide or hurting herself.   Past Medical History  Diagnosis Date  . Bipolar 1 disorder [facility])     Past Surgical History  Procedure Laterality Date  . Right foot surgery    . Abscess drainage      left groin  . Tonsillectomy and adenoidectomy    . Heel spur surgery      Family History  Problem Relation Age of Onset  . Alcohol abuse Father   . Alcohol abuse Maternal Grandfather   . Alcohol abuse Paternal Grandfather     Social History  Substance Use Topics  . Smoking status: Current Every Day Smoker -- 0.50 packs/day    Types: Cigarettes  . Smokeless tobacco: Never Used  . Alcohol Use: 0.6 oz/week    1 Glasses of wine per week     Comment: occasional    Allergies:  Allergies  Allergen Reactions  . Depakote [Divalproex Sodium] Anaphylaxis  . Penicillins Anaphylaxis and Other (See Comments)    Has patient had a PCN reaction causing immediate rash, facial/tongue/throat swelling, SOB or lightheadedness with hypotension: Yes Has patient had a PCN reaction causing severe rash involving mucus membranes or skin  necrosis: No Has patient had a PCN reaction that required hospitalization No Has patient had a PCN reaction occurring within the last 10 years: No If all of the above answers are "NO", then may proceed with Cephalosporin use.  . Other Itching and Other (See Comments)    Pt states that she is allergic to peanut butter.    . Trazodone And Nefazodone Other (See Comments)    Pt states that she does not like how this medication makes her feel.   [name] Vistaril [Hydroxyzine Hcl] Itching    Prescriptions prior to admission  Medication Sig Dispense Refill Last Dose  . calcium carbonate (OSCAL) 1500 (600 Ca) MG TABS tablet Take 600 mg of elemental calcium by mouth 2 (two) times a week.   10/09/2015 at Unknown time  . Multiple Vitamin (MULTIVITAMIN WITH MINERALS) TABS tablet Take 1 tablet by mouth daily.   10/10/2015 at Unknown time    Review of Systems  Constitutional: Negative for fever.  Genitourinary: Positive for dysuria, frequency and vaginal discharge.       Vaginal discharge  Psychiatric/Behavioral: Positive for depression. Negative for suicidal ideas, hallucinations, memory loss and substance abuse. The patient is not nervous/anxious and does not have insomnia.   All other systems reviewed and are negative.  Physical Exam   Blood pressure 129/72, pulse 80, temperature 98.4 F (36.9  C), temperature source Oral, resp. rate 18, last menstrual period 09/20/2015.  Physical Exam  Nursing note and vitals reviewed. Constitutional: She is oriented to person, place, and time. She appears well-developed and well-nourished. No distress.  HENT:  Head: Normocephalic and atraumatic.  Cardiovascular: Normal rate and regular rhythm.   Respiratory: Effort normal and breath sounds normal. No respiratory distress.  GI: Soft. She exhibits no distension and no mass. There is no tenderness. There is no rebound and no guarding.  Genitourinary: Vaginal discharge found.  Small pinkish discharge  Neurological:  She is alert and oriented to person, place, and time.  Skin: Skin is warm and dry.  Psychiatric: She has a normal mood and affect. Her behavior is normal. Judgment and thought content normal.   Results for orders placed or performed during the hospital encounter of 10/10/15 (from the past 24 hour(s))  Urinalysis, Routine w reflex microscopic (not at [facility])     Status: Abnormal   Collection Time: 10/10/15  3:34 PM  Result Value Ref Range   Color, Urine YELLOW YELLOW   APPearance CLEAR CLEAR   Specific Gravity, Urine 1.020 1.005 - 1.030   pH 6.5 5.0 - 8.0   Glucose, UA NEGATIVE NEGATIVE mg/dL   Hgb urine dipstick NEGATIVE NEGATIVE   Bilirubin Urine NEGATIVE NEGATIVE   Ketones, ur NEGATIVE NEGATIVE mg/dL   Protein, ur NEGATIVE NEGATIVE mg/dL   Nitrite NEGATIVE NEGATIVE   Leukocytes, UA SMALL (A) NEGATIVE  Urine microscopic-add on     Status: Abnormal   Collection Time: 10/10/15  3:34 PM  Result Value Ref Range   Squamous Epithelial / LPF 0-5 (A) NONE SEEN   WBC, UA 6-30 0 - 5 WBC/hpf   RBC / HPF 0-5 0 - 5 RBC/hpf   Bacteria, UA FEW (A) NONE SEEN  Pregnancy, urine POC     Status: None   Collection Time: 10/10/15  3:35 PM  Result Value Ref Range   Preg Test, Ur NEGATIVE NEGATIVE  Wet prep, genital     Status: Abnormal   Collection Time: 10/10/15  3:50 PM  Result Value Ref Range   Yeast Wet Prep HPF POC NONE SEEN NONE SEEN   Trich, Wet Prep NONE SEEN NONE SEEN   Clue Cells Wet Prep HPF POC PRESENT (A) NONE SEEN   WBC, Wet Prep HPF POC MANY (A) NONE SEEN   Sperm NONE SEEN    MAU Course  Procedures  MDM Labs indicate that pt has BV and will be treated with Flagyl 500mg  BID x 7 d. She was advised to [facility] to manage her Bipolar Disorder. STD testing done and will treat pt accordingly when results are back. UTI ruled out. Pt will be discharged to home. Pt agreeable to POC.  Assessment and Plan  Bacterial Vaginitis High Risk Sexual  Behavior  Flagyl RX  Discharge to home  [facility] Hospital[facility]mmons,Yarisbel Miranda Grissett 10/10/2015, 4:40 PM

## 2015-10-10 NOTE — Discharge Instructions (Signed)

## 2015-10-10 NOTE — MAU Note (Addendum)
Thinks has UTI for one month, recurrent BV, depression was treated with Abilify last shot 2016, patient tearful, denies suicidal ideations, wants to get back on her bipolar meds. Patient desires STI testing and HIV testing.

## 2015-10-11 LAB — GC/CHLAMYDIA PROBE AMP (~~LOC~~) NOT AT ARMC
Chlamydia: NEGATIVE
Neisseria Gonorrhea: NEGATIVE

## 2015-10-11 LAB — RPR: RPR Ser Ql: NONREACTIVE

## 2015-10-11 LAB — HIV ANTIBODY (ROUTINE TESTING W REFLEX): HIV Screen 4th Generation wRfx: NONREACTIVE

## 2015-11-21 ENCOUNTER — Encounter (HOSPITAL_COMMUNITY): Payer: Self-pay | Admitting: Emergency Medicine

## 2015-11-21 ENCOUNTER — Emergency Department (HOSPITAL_COMMUNITY): Payer: Self-pay

## 2015-11-21 ENCOUNTER — Emergency Department (HOSPITAL_COMMUNITY)
Admission: EM | Admit: 2015-11-21 | Discharge: 2015-11-21 | Disposition: A | Discharge status: Home / Self Care | Payer: Self-pay | Attending: Emergency Medicine | Admitting: Emergency Medicine

## 2015-11-21 DIAGNOSIS — S0081XA Abrasion of other part of head, initial encounter: Secondary | ICD-10-CM | POA: Insufficient documentation

## 2015-11-21 DIAGNOSIS — S0990XA Unspecified injury of head, initial encounter: Secondary | ICD-10-CM

## 2015-11-21 DIAGNOSIS — Y929 Unspecified place or not applicable: Secondary | ICD-10-CM | POA: Insufficient documentation

## 2015-11-21 DIAGNOSIS — Y939 Activity, unspecified: Secondary | ICD-10-CM | POA: Insufficient documentation

## 2015-11-21 DIAGNOSIS — F319 Bipolar disorder, unspecified: Secondary | ICD-10-CM | POA: Insufficient documentation

## 2015-11-21 DIAGNOSIS — Y999 Unspecified external cause status: Secondary | ICD-10-CM | POA: Insufficient documentation

## 2015-11-21 DIAGNOSIS — F17210 Nicotine dependence, cigarettes, uncomplicated: Secondary | ICD-10-CM | POA: Insufficient documentation

## 2015-11-21 DIAGNOSIS — M25552 Pain in left hip: Secondary | ICD-10-CM | POA: Insufficient documentation

## 2015-11-21 MED ORDER — IBUPROFEN 800 MG PO TABS
800.0000 mg | ORAL_TABLET | Freq: Once | ORAL | Status: AC
  Administered 2015-11-21: 800 mg via ORAL
  Filled 2015-11-21: qty 1

## 2015-11-21 MED ORDER — TETANUS-DIPHTH-ACELL PERTUSSIS 5-2.5-18.5 LF-MCG/0.5 IM SUSP
0.5000 mL | Freq: Once | INTRAMUSCULAR | Status: AC
  Administered 2015-11-21: 0.5 mL via INTRAMUSCULAR
  Filled 2015-11-21: qty 0.5

## 2015-11-21 MED ORDER — OXYCODONE HCL 5 MG PO TABS
5.0000 mg | ORAL_TABLET | Freq: Once | ORAL | Status: AC
  Administered 2015-11-21: 5 mg via ORAL
  Filled 2015-11-21: qty 1

## 2015-11-21 MED ORDER — ACETAMINOPHEN 500 MG PO TABS
1000.0000 mg | ORAL_TABLET | Freq: Once | ORAL | Status: AC
  Administered 2015-11-21: 1000 mg via ORAL
  Filled 2015-11-21: qty 2
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
## 2015-11-21 NOTE — Discharge Instructions (Signed)
Take 4 over the counter ibuprofen tablets 3 times a day or 2 over-the-counter naproxen tablets twice a day for pain. ° °General Assault °Assault includes any behavior or physical attack--whether it is on purpose or not--that results in injury to another person, damage to property, or both. This also includes assault that has not yet happened, but is planned to happen. Threats of assault may be physical, verbal, or written. They may be said or sent by: °· Mail. °· E-mail. °· Text. °· Social media. °· Fax. °The threats may be direct, implied, or understood. °WHAT ARE THE DIFFERENT FORMS OF ASSAULT? °Forms of assault include: °· Physically assaulting a person. This includes physical threats to inflict physical harm as well as: °¨ Slapping. °¨ Hitting. °¨ Poking. °¨ Kicking. °¨ Punching. °¨ Pushing. °· Sexually assaulting a person. Sexual assault is any sexual activity that a person is forced, threatened, or coerced to participate in. It may or may not involve physical contact with the person who is assaulting you. You are sexually assaulted if you are forced to have sexual contact of any kind. °· Damaging or destroying a person's assistive equipment, such as glasses, canes, or walkers. °· Throwing or hitting objects. °· Using or displaying a weapon to harm or threaten someone. °· Using or displaying an object that appears to be a weapon in a threatening manner. °· Using greater physical size or strength to intimidate someone. °· Making intimidating or threatening gestures. °· Bullying. °· Hazing. °· Using language that is intimidating, threatening, hostile, or abusive. °· Stalking. °· Restraining someone with force. °WHAT SHOULD I DO IF I EXPERIENCE ASSAULT? °· Report assaults, threats, and stalking to the police. Call your local emergency services (911 in the [place] if you are in immediate danger or you need medical help.  °· You can work with a lawyer or an advocate to get legal protection against someone who has  assaulted you or threatened you with assault. Protection includes restraining orders and private addresses. Crimes against you, such as assault, can also be prosecuted through the courts. Laws will vary depending on where you live. °  °This information is not intended to replace advice given to you by your health care provider. Make sure you discuss any questions you have with your health care provider. °  °Document Released: 05/22/2005 Document Revised: 06/12/2014 Document Reviewed: 02/06/2014 °Elsevier Interactive Patient Education ©2016 [employer]. ° °

## 2015-12-17 ENCOUNTER — Inpatient Hospital Stay (HOSPITAL_COMMUNITY)
Admission: AD | Admit: 2015-12-17 | Discharge: 2015-12-18 | Disposition: A | Discharge status: Home / Self Care | Payer: Self-pay | Source: Ambulatory Visit | Attending: Emergency Medicine | Admitting: Emergency Medicine

## 2015-12-17 ENCOUNTER — Inpatient Hospital Stay (HOSPITAL_COMMUNITY): Payer: No Typology Code available for payment source

## 2015-12-17 DIAGNOSIS — Y939 Activity, unspecified: Secondary | ICD-10-CM | POA: Insufficient documentation

## 2015-12-17 DIAGNOSIS — Y999 Unspecified external cause status: Secondary | ICD-10-CM | POA: Insufficient documentation

## 2015-12-17 DIAGNOSIS — M25561 Pain in right knee: Secondary | ICD-10-CM | POA: Insufficient documentation

## 2015-12-17 DIAGNOSIS — M542 Cervicalgia: Secondary | ICD-10-CM | POA: Insufficient documentation

## 2015-12-17 DIAGNOSIS — M545 Low back pain, unspecified: Secondary | ICD-10-CM

## 2015-12-17 DIAGNOSIS — F17210 Nicotine dependence, cigarettes, uncomplicated: Secondary | ICD-10-CM | POA: Insufficient documentation

## 2015-12-17 DIAGNOSIS — M546 Pain in thoracic spine: Secondary | ICD-10-CM | POA: Insufficient documentation

## 2015-12-17 DIAGNOSIS — Y92410 Unspecified street and highway as the place of occurrence of the external cause: Secondary | ICD-10-CM | POA: Insufficient documentation

## 2015-12-17 LAB — POC URINE PREG, ED: PREG TEST UR: NEGATIVE

## 2015-12-17 MED ORDER — IBUPROFEN 400 MG PO TABS
600.0000 mg | ORAL_TABLET | Freq: Once | ORAL | Status: AC
  Administered 2015-12-17: 600 mg via ORAL

## 2015-12-17 MED ORDER — IBUPROFEN 400 MG PO TABS
ORAL_TABLET | ORAL | Status: AC
  Filled 2015-12-17: qty 1

## 2015-12-17 MED ORDER — METHOCARBAMOL 500 MG PO TABS: 500.0000 mg | ORAL_TABLET | Freq: Two times a day (BID) | ORAL | Status: DC

## 2015-12-17 MED ORDER — NAPROXEN 500 MG PO TABS: 500.0000 mg | ORAL_TABLET | Freq: Two times a day (BID) | ORAL | Status: DC

## 2015-12-17 MED ORDER — OXYCODONE-ACETAMINOPHEN 5-325 MG PO TABS
1.0000 | ORAL_TABLET | Freq: Once | ORAL | Status: AC
  Administered 2015-12-17: 1 via ORAL

## 2015-12-17 MED ORDER — IBUPROFEN 200 MG PO TABS
ORAL_TABLET | ORAL | Status: AC
  Filled 2015-12-17: qty 1

## 2015-12-17 MED ORDER — OXYCODONE-ACETAMINOPHEN 5-325 MG PO TABS
ORAL_TABLET | ORAL | Status: AC
  Filled 2015-12-17: qty 1

## 2015-12-17 MED ORDER — FENTANYL CITRATE (PF) 100 MCG/2ML IJ SOLN
50.0000 ug | Freq: Once | INTRAMUSCULAR | Status: AC
  Administered 2015-12-17: 50 ug via INTRAMUSCULAR
  Filled 2015-12-17: qty 2

## 2015-12-17 NOTE — ED Notes (Signed)
X-ray called for urine preg. Techs to collect
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
## 2015-12-17 NOTE — ED Notes (Signed)
Pt stated she was unable to urinate
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
## 2015-12-17 NOTE — MAU Note (Signed)
Pt not in lobby.  

## 2015-12-17 NOTE — MAU Note (Signed)
Not in lobby
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
## 2016-02-09 ENCOUNTER — Encounter (HOSPITAL_COMMUNITY): Payer: Self-pay | Admitting: Emergency Medicine

## 2016-02-09 ENCOUNTER — Emergency Department (HOSPITAL_COMMUNITY)
Admission: EM | Admit: 2016-02-09 | Discharge: 2016-02-09 | Discharge status: Against Medical Advice | Payer: Self-pay | Attending: Dermatology | Admitting: Dermatology

## 2016-02-09 DIAGNOSIS — Z791 Long term (current) use of non-steroidal anti-inflammatories (NSAID): Secondary | ICD-10-CM | POA: Insufficient documentation

## 2016-02-09 DIAGNOSIS — Z79899 Other long term (current) drug therapy: Secondary | ICD-10-CM | POA: Insufficient documentation

## 2016-02-09 DIAGNOSIS — F1290 Cannabis use, unspecified, uncomplicated: Secondary | ICD-10-CM | POA: Insufficient documentation

## 2016-02-09 DIAGNOSIS — Z5321 Procedure and treatment not carried out due to patient leaving prior to being seen by health care provider: Secondary | ICD-10-CM | POA: Insufficient documentation

## 2016-02-09 DIAGNOSIS — F1423 Cocaine dependence with withdrawal: Secondary | ICD-10-CM | POA: Insufficient documentation

## 2016-02-09 DIAGNOSIS — F17210 Nicotine dependence, cigarettes, uncomplicated: Secondary | ICD-10-CM | POA: Insufficient documentation
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
## 2016-02-09 NOTE — ED Triage Notes (Addendum)
Pt wants detox from cocaine and marijuana. Last use of both today. Denies SI/HI. Last drink etoh on Saturday, not a daily drinker. Pt also wants her vision checked as she has had blurred vision for the past 2 years.

## 2016-02-29 ENCOUNTER — Emergency Department (HOSPITAL_COMMUNITY)
Admission: EM | Admit: 2016-02-29 | Discharge: 2016-02-29 | Disposition: A | Discharge status: Other Acute Inpatient Hospital | Payer: Self-pay | Attending: Emergency Medicine | Admitting: Emergency Medicine

## 2016-02-29 ENCOUNTER — Inpatient Hospital Stay
Admit: 2016-02-29 | Discharge: 2016-03-05 | DRG: 876 | Disposition: A | Discharge status: Home / Self Care | Payer: Medicaid Other | Attending: Psychiatry | Admitting: Psychiatry

## 2016-02-29 ENCOUNTER — Encounter (HOSPITAL_COMMUNITY): Payer: Self-pay | Admitting: *Deleted

## 2016-02-29 ENCOUNTER — Encounter (HOSPITAL_COMMUNITY): Payer: Self-pay | Admitting: Emergency Medicine

## 2016-02-29 ENCOUNTER — Inpatient Hospital Stay (HOSPITAL_COMMUNITY)
Admission: AD | Admit: 2016-02-29 | Discharge: 2016-02-29 | Disposition: A | Discharge status: Home / Self Care | Payer: Self-pay | Source: Ambulatory Visit | Attending: Family Medicine | Admitting: Family Medicine

## 2016-02-29 DIAGNOSIS — Z880 Allergy status to penicillin: Secondary | ICD-10-CM | POA: Diagnosis not present

## 2016-02-29 DIAGNOSIS — F3111 Bipolar disorder, current episode manic without psychotic features, mild: Secondary | ICD-10-CM | POA: Insufficient documentation

## 2016-02-29 DIAGNOSIS — F17210 Nicotine dependence, cigarettes, uncomplicated: Secondary | ICD-10-CM | POA: Insufficient documentation

## 2016-02-29 DIAGNOSIS — L02412 Cutaneous abscess of left axilla: Secondary | ICD-10-CM | POA: Diagnosis present

## 2016-02-29 DIAGNOSIS — L02419 Cutaneous abscess of limb, unspecified: Secondary | ICD-10-CM | POA: Diagnosis present

## 2016-02-29 DIAGNOSIS — F309 Manic episode, unspecified: Secondary | ICD-10-CM | POA: Diagnosis present

## 2016-02-29 DIAGNOSIS — F17200 Nicotine dependence, unspecified, uncomplicated: Secondary | ICD-10-CM | POA: Diagnosis present

## 2016-02-29 DIAGNOSIS — F1210 Cannabis abuse, uncomplicated: Secondary | ICD-10-CM | POA: Diagnosis present

## 2016-02-29 DIAGNOSIS — F419 Anxiety disorder, unspecified: Secondary | ICD-10-CM | POA: Diagnosis present

## 2016-02-29 DIAGNOSIS — L03114 Cellulitis of left upper limb: Secondary | ICD-10-CM | POA: Diagnosis present

## 2016-02-29 DIAGNOSIS — Z811 Family history of alcohol abuse and dependence: Secondary | ICD-10-CM | POA: Diagnosis not present

## 2016-02-29 DIAGNOSIS — Z888 Allergy status to other drugs, medicaments and biological substances status: Secondary | ICD-10-CM | POA: Diagnosis not present

## 2016-02-29 DIAGNOSIS — J45909 Unspecified asthma, uncomplicated: Secondary | ICD-10-CM | POA: Diagnosis present

## 2016-02-29 DIAGNOSIS — F3113 Bipolar disorder, current episode manic without psychotic features, severe: Principal | ICD-10-CM | POA: Diagnosis present

## 2016-02-29 DIAGNOSIS — F1220 Cannabis dependence, uncomplicated: Secondary | ICD-10-CM | POA: Diagnosis present

## 2016-02-29 DIAGNOSIS — R4182 Altered mental status, unspecified: Secondary | ICD-10-CM | POA: Insufficient documentation

## 2016-02-29 DIAGNOSIS — F3110 Bipolar disorder, current episode manic without psychotic features, unspecified: Secondary | ICD-10-CM

## 2016-02-29 DIAGNOSIS — Z79899 Other long term (current) drug therapy: Secondary | ICD-10-CM | POA: Insufficient documentation

## 2016-02-29 DIAGNOSIS — F05 Delirium due to known physiological condition: Secondary | ICD-10-CM

## 2016-02-29 HISTORY — DX: Anxiety disorder, unspecified: F41.9

## 2016-02-29 HISTORY — DX: Bipolar disorder, current episode manic without psychotic features, severe: F31.13

## 2016-02-29 LAB — RAPID URINE DRUG SCREEN, HOSP PERFORMED
AMPHETAMINES: NOT DETECTED
Barbiturates: NOT DETECTED
Benzodiazepines: NOT DETECTED
Cocaine: NOT DETECTED
OPIATES: NOT DETECTED
TETRAHYDROCANNABINOL: POSITIVE — AB

## 2016-02-29 LAB — COMPREHENSIVE METABOLIC PANEL
ALBUMIN: 4.6 g/dL (ref 3.5–5.0)
ALT: 11 U/L — AB (ref 14–54)
AST: 20 U/L (ref 15–41)
Alkaline Phosphatase: 60 U/L (ref 38–126)
Anion gap: 8 (ref 5–15)
BUN: 9 mg/dL (ref 6–20)
CHLORIDE: 109 mmol/L (ref 101–111)
CO2: 19 mmol/L — AB (ref 22–32)
Calcium: 9.4 mg/dL (ref 8.9–10.3)
Creatinine, Ser: 0.54 mg/dL (ref 0.44–1.00)
GFR calc non Af Amer: >60 mL/min (ref >60)
GLUCOSE: 116 mg/dL — AB (ref 65–99)
Potassium: 3.1 mmol/L — ABNORMAL LOW (ref 3.5–5.1)
Sodium: 136 mmol/L (ref 135–145)
Total Bilirubin: 1.1 mg/dL (ref 0.3–1.2)
Total Protein: 7.8 g/dL (ref 6.5–8.1)

## 2016-02-29 LAB — CBC WITH DIFFERENTIAL/PLATELET
Basophils Absolute: 0 10*3/uL (ref 0.0–0.1)
Basophils Relative: 0 %
Eosinophils Absolute: 0 10*3/uL (ref 0.0–0.7)
Eosinophils Relative: 0 %
HCT: 35.7 % — ABNORMAL LOW (ref 36.0–46.0)
HEMOGLOBIN: 12.4 g/dL (ref 12.0–15.0)
LYMPHS ABS: 2.3 10*3/uL (ref 0.7–4.0)
LYMPHS PCT: 28 %
MCH: 31.1 pg (ref 26.0–34.0)
MCHC: 34.7 g/dL (ref 30.0–36.0)
MCV: 89.5 fL (ref 78.0–100.0)
MONOS PCT: 9 %
Monocytes Absolute: 0.8 10*3/uL (ref 0.1–1.0)
NEUTROS ABS: 5.3 10*3/uL (ref 1.7–7.7)
NEUTROS PCT: 63 %
PLATELETS: 233 10*3/uL (ref 150–400)
RBC: 3.99 MIL/uL (ref 3.87–5.11)
RDW: 12.6 % (ref 11.5–15.5)
WBC: 8.5 10*3/uL (ref 4.0–10.5)

## 2016-02-29 LAB — I-STAT BETA HCG BLOOD, ED (MC, WL, AP ONLY)

## 2016-02-29 LAB — ETHANOL: Alcohol, Ethyl (B): <5 mg/dL (ref <5)

## 2016-02-29 MED ORDER — ACETAMINOPHEN 325 MG PO TABS
650.0000 mg | ORAL_TABLET | Freq: Four times a day (QID) | ORAL | Status: DC | PRN
  Administered 2016-02-29 – 2016-03-04 (×8): 650 mg via ORAL
  Filled 2016-02-29 (×8): qty 2

## 2016-02-29 MED ORDER — ARIPIPRAZOLE 10 MG PO TABS
20.0000 mg | ORAL_TABLET | Freq: Every day | ORAL | Status: DC
  Administered 2016-02-29 – 2016-03-04 (×5): 20 mg via ORAL
  Filled 2016-02-29 (×6): qty 2

## 2016-02-29 MED ORDER — ARIPIPRAZOLE 10 MG PO TABS
10.0000 mg | ORAL_TABLET | Freq: Every day | ORAL | Status: DC
  Administered 2016-02-29: 10 mg via ORAL
  Filled 2016-02-29: qty 1

## 2016-02-29 MED ORDER — POTASSIUM CHLORIDE CRYS ER 20 MEQ PO TBCR
40.0000 meq | EXTENDED_RELEASE_TABLET | Freq: Once | ORAL | Status: AC
  Administered 2016-02-29: 40 meq via ORAL
  Filled 2016-02-29: qty 2

## 2016-02-29 MED ORDER — ARIPIPRAZOLE ER 300 MG IM SUSR: 150.0000 mg | INTRAMUSCULAR | Status: DC

## 2016-02-29 MED ORDER — MAGNESIUM HYDROXIDE 400 MG/5ML PO SUSP: 30.0000 mL | Freq: Every day | ORAL | Status: DC | PRN

## 2016-02-29 MED ORDER — ALUM & MAG HYDROXIDE-SIMETH 200-200-20 MG/5ML PO SUSP: 30.0000 mL | ORAL | Status: DC | PRN

## 2016-02-29 MED ORDER — HYDROGEN PEROXIDE 3 % EX SOLN
CUTANEOUS | Status: AC
  Filled 2016-02-29: qty 473

## 2016-02-29 MED ORDER — TEMAZEPAM 15 MG PO CAPS
15.0000 mg | ORAL_CAPSULE | Freq: Every day | ORAL | Status: DC
  Administered 2016-02-29 – 2016-03-04 (×5): 15 mg via ORAL
  Filled 2016-02-29 (×5): qty 1

## 2016-02-29 MED ORDER — CARBAMAZEPINE 200 MG PO TABS
200.0000 mg | ORAL_TABLET | Freq: Two times a day (BID) | ORAL | Status: DC
  Administered 2016-02-29 – 2016-03-05 (×10): 200 mg via ORAL
  Filled 2016-02-29 (×10): qty 1
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
## 2016-02-29 NOTE — ED Triage Notes (Signed)
Pt states she has an abscess under her left arm that came up on Monday  Pt states it is a spider bite and it has put poison in her  Pt has a raised red area noted under her left arm  Pt states she also wants her vision checked, and wants to be checked for cancer because she is a smoker and would like a pregnancy test  Pt does not know when her last period was  Pt is crying in triage

## 2016-02-29 NOTE — ED Notes (Signed)
Sheriff on unit [facility] order. Pt has no personal property d/t pt signed for family to take personal property earlier on the unit. Pt ambulatory off unit with sheriff.

## 2016-02-29 NOTE — Tx Team (Signed)
Initial Treatment Plan 02/29/2016 6:38 PM Jackye Karsten RoL Reppucci [id number]    PATIENT STRESSORS: Financial difficulties Occupational concerns Substance abuse   PATIENT STRENGTHS: Barrister's clerkCommunication skills Motivation for treatment/growth Supportive family/friends   PATIENT IDENTIFIED PROBLEMS: Bipolar disorder manic 02/29/2016  Anxiety disorder 02/29/2016.                   DISCHARGE CRITERIA:  Adequate post-discharge living arrangements Improved stabilization in mood, thinking, and/or behavior Motivation to continue treatment in a less acute level of care Verbal commitment to aftercare and medication compliance  PRELIMINARY DISCHARGE PLAN: Attend aftercare/continuing care group Return to previous living arrangement  PATIENT/FAMILY INVOLVEMENT: This treatment plan has been presented to and reviewed with the patient, Elizabeth [name], and/or family member, .  The patient and family have been given the opportunity to ask questions and make suggestions.  Leonarda SalonGigi George Fannye Myer, RN 02/29/2016, 6:38 PM

## 2016-02-29 NOTE — ED Notes (Signed)
Sheriff called for transport, awaiting call back.

## 2016-02-29 NOTE — ED Notes (Signed)
Offered to patient to irrigate ear again. Pt refused.

## 2016-02-29 NOTE — MAU Provider Note (Signed)
History     CSN: [id number]  Arrival date and time: 02/29/16 0501   First Provider Initiated Contact with Patient 02/29/16 [id number]      Chief Complaint  Patient presents with  . Altered Mental Status   Jeanifer L Quade is a 25 y.o. G1P0010 who walked over today from [facility]. She states that she was there because she was confused. She is having flight of ideas here. She states that her head "feels funny" like she left her perm in too long, or maybe she didn't take her medicine, or may be she took drugs. She is on the phone with her mother at this time. She states that she really wants to go back to [facility] because she needs to have mind checked out, and she wants to be committed.    Altered Mental Status  This is a new problem. The current episode started today. The problem occurs constantly. The problem has been unchanged.    Past Medical History:  Diagnosis Date  . Anxiety   . Bipolar 1 disorder [facility])     Past Surgical History:  Procedure Laterality Date  . ABSCESS DRAINAGE     left groin  . HEEL SPUR SURGERY    . right foot surgery    . TONSILLECTOMY AND ADENOIDECTOMY      Family History  Problem Relation Age of Onset  . Alcohol abuse Father   . Alcohol abuse Maternal Grandfather   . Alcohol abuse Paternal Grandfather     Social History  Substance Use Topics  . Smoking status: Current Every Day Smoker    Packs/day: 0.50    Types: Cigarettes  . Smokeless tobacco: Never Used  . Alcohol use 0.6 oz/week    1 Glasses of wine per week     Comment: occasional    Allergies:  Allergies  Allergen Reactions  . Depakote [Divalproex Sodium] Anaphylaxis  . Penicillins Anaphylaxis and Other (See Comments)    Has patient had a PCN reaction causing immediate rash, facial/tongue/throat swelling, SOB or lightheadedness with hypotension: Yes Has patient had a PCN reaction causing severe rash involving mucus membranes or skin necrosis: No Has patient had a PCN reaction  that required hospitalization No Has patient had a PCN reaction occurring within the last 10 years: No If all of the above answers are "NO", then may proceed with Cephalosporin use.  [name] Peanut Butter Flavor   . Risperidone And Related   . Trazodone And Nefazodone Other (See Comments)    Pt states that she does not like how this medication makes her feel.   . Vistaril [Hydroxyzine Hcl] Itching  . Other Itching, Rash and Other (See Comments)    Pt states that she is allergic to peanut butter (no reaction to peanuts)     Prescriptions Prior to Admission  Medication Sig Dispense Refill Last Dose  . naproxen (NAPROSYN) 500 MG tablet Take 1 tablet (500 mg total) by mouth 2 (two) times daily. 30 tablet 0 02/29/2016 at Unknown time  . Ibuprofen-Diphenhydramine Cit (ADVIL PM PO) Take 2 tablets by mouth daily as needed (headache).   12/16/2015 at Unknown time  . methocarbamol (ROBAXIN) 500 MG tablet Take 1 tablet (500 mg total) by mouth 2 (two) times daily. 20 tablet 0   . Multiple Vitamin (MULTIVITAMIN WITH MINERALS) TABS tablet Take 1 tablet by mouth daily.   3 weeks ago    Review of Systems  Unable to perform ROS: Psychiatric disorder   Physical Exam  Blood pressure (!) 101/47, temperature 98.7 F (37.1 C), temperature source Oral, resp. rate 16, height 5' 3.5" (1.613 m), weight 149 lb (67.6 kg).  Physical Exam  Nursing note and vitals reviewed. Constitutional: She is oriented to person, place, and time. She appears well-developed and well-nourished. She appears distressed.  Cardiovascular: Normal rate.   Respiratory: Effort normal.  Musculoskeletal: Normal range of motion.  Neurological: She is alert and oriented to person, place, and time.  Psychiatric: Her affect is labile. Her affect is not inappropriate. Her speech is tangential. She is agitated. She expresses impulsivity and inappropriate judgment. She expresses no suicidal plans and no homicidal plans.    MAU Course   Procedures  [facility] [place].  Patient states that [facility]  0608: [facility] [phone]: [facility] here to transfer patient  0631: Patient left the unit with [facility] transfer   Assessment and Plan   1. Acute confusional state    [facility]   Tawnya CrookHogan, Rube Sanchez Donovan 02/29/2016, 5:51 AM

## 2016-02-29 NOTE — ED Notes (Signed)
Pt admitted to room #40. Pt guarded, forwards little with this nurse. Pt reports she is at the hospital d/t "trying to detox from marijuana." Pt denies SI/HI. Presents with paranoia. Pt reports poor appetite and poor sleep. Special checks q 15 mins in place for safety. Video monitoring in place.

## 2016-02-29 NOTE — Progress Notes (Signed)
Patient looks anxius but cooperative during admission assessment. Patient denies SI/HI at this time. Patient denies AVH. Patient informed of fall risk status, fall risk assessed "low" at this time. Patient oriented to unit/staff/room. Patient denies any questions/concerns at this time. Patient safe on unit with Q15 minute checks for safety. Skin assessment & body search done.No contraband found.

## 2016-02-29 NOTE — BH Assessment (Signed)
[facility] Assessment Progress Note  Per Thedore MinsMojeed Akintayo, MD, this pt requires psychiatric hospitalization at this time.  Pt present under IVC initiated by her mother and upheld by EDP Iantha FallenJohn Knapp, MD.  Pt's nurse, Morrie SheldonAshley, reports that Lillia AbedLindsay, RN, [facility] called her to inform her that pt has been [facility] by Dr Jennet MaduroPucilowska.  She has been assigned to Rm 307, and they are ready to receive the pt any time.  At the request of Dedra SkeensGwen, the charge nurse at [facility], this writer faxed IVC documents to [phone].  Pt's nurse, Morrie Sheldonshley, agrees to call report to [phone].  Pt is to be transported via [employer].  Doylene Canninghomas Kimie Pidcock, MA Triage Specialist [phone]

## 2016-02-29 NOTE — ED Notes (Signed)
[facility] Expected date:  Expected time:  Means of arrival:  Comments: Elizabeth KohJones, Elizabeth [name]

## 2016-02-29 NOTE — MAU Note (Signed)
Pt reports that she feels "delusional". [facility]. Pt is stating that she is hungry and does not remember the last time that she ate. Pt is tearful

## 2016-02-29 NOTE — ED Notes (Signed)
Attempted to perform cerumen extraction using hydrogen peroxide and water, pt refused treatment after feeling diluted hydrogen peroxide in her ear. Explained to patient that this was the way to get the cerumen out of her ear. Pt refuses at this time. Pt appears manic. Will reassess condition later.

## 2016-03-01 ENCOUNTER — Encounter: Payer: Self-pay | Admitting: Psychiatry

## 2016-03-01 DIAGNOSIS — F17200 Nicotine dependence, unspecified, uncomplicated: Secondary | ICD-10-CM

## 2016-03-01 DIAGNOSIS — Z5181 Encounter for therapeutic drug level monitoring: Secondary | ICD-10-CM

## 2016-03-01 DIAGNOSIS — F3113 Bipolar disorder, current episode manic without psychotic features, severe: Principal | ICD-10-CM

## 2016-03-01 HISTORY — DX: Nicotine dependence, unspecified, uncomplicated: F17.200

## 2016-03-01 MED ORDER — IBUPROFEN 400 MG PO TABS
400.0000 mg | ORAL_TABLET | Freq: Four times a day (QID) | ORAL | Status: DC | PRN
  Administered 2016-03-01 – 2016-03-03 (×3): 400 mg via ORAL
  Filled 2016-03-01 (×3): qty 1

## 2016-03-01 MED ORDER — NICOTINE 21 MG/24HR TD PT24
21.0000 mg | MEDICATED_PATCH | Freq: Every day | TRANSDERMAL | Status: DC
  Administered 2016-03-01 – 2016-03-04 (×4): 21 mg via TRANSDERMAL
  Filled 2016-03-01 (×4): qty 1

## 2016-03-01 MED ORDER — ALUM & MAG HYDROXIDE-SIMETH 200-200-20 MG/5ML PO SUSP: 30.0000 mL | ORAL | Status: DC | PRN

## 2016-03-01 MED ORDER — MAGNESIUM HYDROXIDE 400 MG/5ML PO SUSP: 30.0000 mL | Freq: Every day | ORAL | Status: DC | PRN

## 2016-03-01 MED ORDER — ACETAMINOPHEN 325 MG PO TABS: 650.0000 mg | ORAL_TABLET | Freq: Four times a day (QID) | ORAL | Status: DC | PRN

## 2016-03-01 MED ORDER — INFLUENZA VAC SPLIT QUAD 0.5 ML IM SUSY
0.5000 mL | PREFILLED_SYRINGE | INTRAMUSCULAR | Status: DC
Start: 2016-03-02 — End: 2016-03-05
  Filled 2016-03-01: qty 0.5

## 2016-03-01 MED ORDER — SULFAMETHOXAZOLE-TRIMETHOPRIM 800-160 MG PO TABS
1.0000 | ORAL_TABLET | Freq: Two times a day (BID) | ORAL | Status: DC
  Administered 2016-03-01 – 2016-03-05 (×8): 1 via ORAL
  Filled 2016-03-01 (×10): qty 1

## 2016-03-01 NOTE — Progress Notes (Signed)
D:Patient appears very bizarre and somewhat confused. States she's here because of "weed and medicine." Patient appeared to be repeating herself. Denies SI/HI/AVH.  A: Medication given with education. Encouragement provided.  R: Patient was compliant with medication. Stated later on in the evening that she felt weird. VS stable. Patient went back to bed with no other complaints. That has remained calm and cooperative. Safety maintained with 15 min checks.

## 2016-03-01 NOTE — Plan of Care (Signed)
Problem: Safety: Goal: Periods of time without injury will increase Outcome: Progressing Patient has remained free of injury during this shift.

## 2016-03-01 NOTE — Plan of Care (Signed)
Problem: Coping: Goal: Ability to demonstrate self-control will improve Outcome: Not Progressing Pt very hyperverbal, manic, impulsive with behaviors today. Makes multiple requests for food, medications, etc.

## 2016-03-01 NOTE — B[facility] Suicide Risk Assessment (Signed)
[facility] INPATIENT:  Family/Significant Other Suicide Prevention Education  Suicide Prevention Education:  Education Completed; mother, Elizabeth StarchMyra [name] ph #: [phone] has been identified by the patient as the family member/significant other with whom the patient will be residing, and identified as the person(s) who will aid the patient in the event of a mental health crisis (suicidal ideations/suicide attempt).  With written consent from the patient, the family member/significant other has been provided the following suicide prevention education, prior to the and/or following the discharge of the patient. Pt's mother stated that pt was using drugs and started having bizarre behaviors. She stated that pt became a different person and was aggressive with her which is unusual.    The suicide prevention education provided includes the following:  Suicide risk factors  Suicide prevention and interventions  National Suicide Hotline telephone number  [facility] assessment telephone number  [facility] Emergency Assistance 911  [facility] and/or Residential Mobile Crisis Unit telephone number  Request made of family/significant other to:  Remove weapons (e.g., guns, rifles, knives), all items previously/currently identified as safety concern.    Remove drugs/medications (over-the-counter, prescriptions, illicit drugs), all items previously/currently identified as a safety concern.  The family member/significant other verbalizes understanding of the suicide prevention education information provided.  The family member/significant other agrees to remove the items of safety concern listed above.  Elizabeth OxfordKadijah R Clance Baquero, MSW, LCSW-A 03/01/2016, 10:07 AM
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
## 2016-03-01 NOTE — BHH Group Notes (Signed)
[facility] LCSW Group Therapy   03/01/2016  1:00 pm   Type of Therapy: Group Therapy   Participation Level: Active   Participation Quality: Attentive, Sharing and Supportive   Affect: Appropriate  Cognitive: Alert and Oriented   Insight: Developing/Improving and Engaged   Engagement in Therapy: Developing/Improving and Engaged   Modes of Intervention: Clarification, Confrontation, Discussion, Education, Exploration, Limit-setting, Orientation, Problem-solving, Rapport Building, Dance movement psychotherapisteality Testing, Socialization and Support   Summary of Progress/Problems: The topic for group today was emotional regulation. This group focused on both positive and negative emotion identification and allowed  group members to process ways to identify feelings, regulate negative emotions, and find healthy ways to manage internal/external emotions. Group members were asked to reflect on a time when their reaction to an emotion led to a negative outcome and explored how alternative responses using emotion regulation would have benefited them. Group members were also asked to discuss a time when emotion regulation was utilized when a negative emotion was experienced. Pt defined emotion regulation as " having steady emotions." She stated that the emotion that she felt before coming into the hospital was rage and feeling overwhelmed. She stated to regulate those negative emotions she would benefit from coloring, exercise, and writing.      Elizabeth AbbotKadijah Keiarra Charon, MSW, LCSWA 03/01/2016, 2:02PM
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
## 2016-03-01 NOTE — Progress Notes (Signed)
   03/01/16 1300  Clinical Encounter Type  Visited With Patient;Health care provider  Visit Type Initial;Psychological support;Spiritual support;Social support  Referral From Other (Comment)  Consult/Referral To Chaplain  Recommendations None at this time.  Spiritual Encounters  Spiritual Needs Prayer;Emotional  Stress Factors  Patient Stress Factors Health changes  Family Stress Factors None identified  Advance Directives (For Healthcare)  Does patient have an advance directive? No  Would patient like information on creating an advanced directive? No - patient declined information    Chaplain spoke with patient this morning and this afternoon. Patient is very willing to participate and engages in conversation about her well-being and current state freely, without reservations. Chaplain will continue to engage her and see if there is anything in particular that she would like to talk about. Patient seems very relaxed and in a good space today.

## 2016-03-01 NOTE — Progress Notes (Signed)
Recreation Therapy Notes  Date: 09.27.17 Time: 9:30 am Location: Craft Room  Group Topic: Self-esteem  Goal Area(s) Addresses:  Patient will write positive traits about self. Patient will verbalize benefit of having a healthy self-esteem.  Behavioral Response: Attentive, Interactive  Intervention: I Am  Activity: Patients were given a worksheet with the letter I on it and were instructed to write as many positive traits about themselves inside the letter.  Education: LRT educated patients on ways they can increase their self-esteem.  Education Outcome: Acknowledges education/In group clarification offered  Clinical Observations/Feedback: Patient completed activity by writing positive traits about self. Patient left group at approximately 9:55 am because she was called to the nurse's station. Patient returned to group at approximately 10:05 am. Patient contributed to group discussion by stating how she can increase her self-esteem.  Jacquelynn CreeGreene,Sumire Halbleib M, LRT/CTRS 03/01/2016 10:24 AM

## 2016-03-01 NOTE — Tx Team (Signed)
Interdisciplinary Treatment and Diagnostic Plan Update  03/01/2016 Time of Session: 10:30AM Elizabeth [name] MRN: [id number]  Principal Diagnosis: Bipolar affective disorder, manic, severe (HCC)  Secondary Diagnoses: Principal Problem:   Bipolar affective disorder, manic, severe (HCC) Active Problems:   Cannabis use disorder, moderate, dependence (HCC)   Tobacco use disorder   Current Medications:  Current Facility-Administered Medications  Medication Dose Route Frequency Provider Last Rate Last Dose  . acetaminophen (TYLENOL) tablet 650 mg  650 mg Oral Q6H PRN Charm RingsJamison Y Lord, NP   650 mg at 03/01/16 0913  . alum & mag hydroxide-simeth (MAALOX/MYLANTA) 200-200-20 MG/5ML suspension 30 mL  30 mL Oral Q4H PRN Charm RingsJamison Y Lord, NP      . ARIPiprazole (ABILIFY) tablet 20 mg  20 mg Oral QHS Shari ProwsJolanta B Pucilowska, MD   20 mg at 02/29/16 2136  . carbamazepine (TEGRETOL) tablet 200 mg  200 mg Oral BID AC & HS Jolanta B Pucilowska, MD   200 mg at 03/01/16 0806  . [START ON 03/02/2016] Influenza vac split quadrivalent PF (FLUARIX) injection 0.5 mL  0.5 mL Intramuscular Tomorrow-1000 Jolanta B Pucilowska, MD      . magnesium hydroxide (MILK OF MAGNESIA) suspension 30 mL  30 mL Oral Daily PRN Charm RingsJamison Y Lord, NP      . nicotine (NICODERM CQ - dosed in mg/24 hours) patch 21 mg  21 mg Transdermal Daily Shari ProwsJolanta B Pucilowska, MD   21 mg at 03/01/16 0806  . temazepam (RESTORIL) capsule 15 mg  15 mg Oral QHS Shari ProwsJolanta B Pucilowska, MD   15 mg at 02/29/16 2136   PTA Medications: Prescriptions Prior to Admission  Medication Sig Dispense Refill Last Dose  . ARIPiprazole (ABILIFY) 10 MG tablet Take 10 mg by mouth daily.   02/29/2016 at Unknown time  . ARIPiprazole ER (ABILIFY MAINTENA) 300 MG SUSR Inject 150 mg into the muscle every 30 (thirty) days.   02/16/2016  . methocarbamol (ROBAXIN) 500 MG tablet Take 1 tablet (500 mg total) by mouth 2 (two) times daily. (Patient not taking: Reported on 02/29/2016) 20 tablet 0  Not Taking at Unknown time  . Multiple Vitamin (MULTIVITAMIN WITH MINERALS) TABS tablet Take 1 tablet by mouth daily.   02/28/2016 at Unknown time  . naproxen (NAPROSYN) 500 MG tablet Take 1 tablet (500 mg total) by mouth 2 (two) times daily. (Patient not taking: Reported on 02/29/2016) 30 tablet 0 Not Taking at Unknown time    Patient Stressors: Financial difficulties Occupational concerns Substance abuse  Patient Strengths: Barrister's clerkCommunication skills Motivation for treatment/growth Supportive family/friends  Treatment Modalities: Medication Management, Group therapy, Case management,  1 to 1 session with clinician, Psychoeducation, Recreational therapy.   Physician Treatment Plan for Primary Diagnosis: Bipolar affective disorder, manic, severe (HCC) Long Term Goal(s): Improvement in symptoms so as ready for discharge Improvement in symptoms so as ready for discharge   Short Term Goals: Ability to identify changes in lifestyle to reduce recurrence of condition will improve Ability to verbalize feelings will improve Ability to demonstrate self-control will improve Ability to identify and develop effective coping behaviors will improve Ability to maintain clinical measurements within normal limits will improve Compliance with prescribed medications will improve Ability to identify changes in lifestyle to reduce recurrence of condition will improve Ability to demonstrate self-control will improve Ability to identify triggers associated with substance abuse/mental health issues will improve  Medication Management: Evaluate patient's response, side effects, and tolerance of medication regimen.  Therapeutic Interventions: 1 to 1 sessions, Unit Group  sessions and Medication administration.  Evaluation of Outcomes: Progressing  Physician Treatment Plan for Secondary Diagnosis: Principal Problem:   Bipolar affective disorder, manic, severe (HCC) Active Problems:   Cannabis use disorder,  moderate, dependence (HCC)   Tobacco use disorder  Long Term Goal(s): Improvement in symptoms so as ready for discharge Improvement in symptoms so as ready for discharge   Short Term Goals: Ability to identify changes in lifestyle to reduce recurrence of condition will improve Ability to verbalize feelings will improve Ability to demonstrate self-control will improve Ability to identify and develop effective coping behaviors will improve Ability to maintain clinical measurements within normal limits will improve Compliance with prescribed medications will improve Ability to identify changes in lifestyle to reduce recurrence of condition will improve Ability to demonstrate self-control will improve Ability to identify triggers associated with substance abuse/mental health issues will improve     Medication Management: Evaluate patient's response, side effects, and tolerance of medication regimen.  Therapeutic Interventions: 1 to 1 sessions, Unit Group sessions and Medication administration.  Evaluation of Outcomes: Progressing   RN Treatment Plan for Primary Diagnosis: Bipolar affective disorder, manic, severe (HCC) Long Term Goal(s): Knowledge of disease and therapeutic regimen to maintain health will improve  Short Term Goals: Ability to verbalize feelings will improve, Ability to disclose and discuss suicidal ideas, Ability to identify and develop effective coping behaviors will improve and Compliance with prescribed medications will improve  Medication Management: RN will administer medications as ordered by provider, will assess and evaluate patient's response and provide education to patient for prescribed medication. RN will report any adverse and/or side effects to prescribing provider.  Therapeutic Interventions: 1 on 1 counseling sessions, Psychoeducation, Medication administration, Evaluate responses to treatment, Monitor vital signs and CBGs as ordered, Perform/monitor CIWA,  COWS, AIMS and Fall Risk screenings as ordered, Perform wound care treatments as ordered.  Evaluation of Outcomes: Progressing   LCSW Treatment Plan for Primary Diagnosis: Bipolar affective disorder, manic, severe (HCC) Long Term Goal(s): Safe transition to appropriate next level of care at discharge, Engage patient in therapeutic group addressing interpersonal concerns.  Short Term Goals: Engage patient in aftercare planning with referrals and resources, Facilitate acceptance of mental health diagnosis and concerns, Identify triggers associated with mental health/substance abuse issues and Increase skills for wellness and recovery  Therapeutic Interventions: Assess for all discharge needs, 1 to 1 time with Social worker, Explore available resources and support systems, Assess for adequacy in community support network, Educate family and significant other(s) on suicide prevention, Complete Psychosocial Assessment, Interpersonal group therapy.  Evaluation of Outcomes: Progressing   Progress in Treatment: Attending groups: Yes. Participating in groups: Yes. Taking medication as prescribed: Yes. Toleration medication: Yes. Family/Significant other contact made: Yes, individual(s) contacted:  Mother, Myra  Patient understands diagnosis: Yes. Discussing patient identified problems/goals with staff: Yes. Medical problems stabilized or resolved: Yes. Denies suicidal/homicidal ideation: Yes. Issues/concerns per patient self-inventory: No. Other:    Discharge Plan or Barriers: see above  Reason for Continuation of Hospitalization: Aggression Delusions  Hallucinations  Estimated Length of Stay:  Attendees: Patient: Elizabeth [name] 03/01/2016 2:21 PM  Physician: Dr. Jennet Maduro, MD 03/01/2016 2:21 PM  Nursing: Elenore Paddy, RN 03/01/2016 2:21 PM  RN Care Manager: 03/01/2016 2:21 PM  Social Worker: Hampton Abbot, LCSWA 03/01/2016 2:21 PM  Recreational Therapist: Hershal Coria, LRT, CTRS  03/01/2016 2:21 PM  Other: York Grice, LCSWA, LCAS 03/01/2016 2:21 PM  Other: Jake Shark, LCSWA 03/01/2016 2:21 PM  Other: 03/01/2016 2:21 PM  Scribe for Treatment Team: Lynden Oxford, Theresia Majors 03/01/2016 2:21 PM
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
## 2016-03-01 NOTE — Progress Notes (Signed)
Pt has been awake, alert, up on unit all day. Continues to be pleasant, but  hyperverbal, manic with multiple needs voiced. Very impulsive with behaviors. Complains of L axilla "keloid" pain. Reports fair sleep last night. Poor appetite-requested/denied multiple meal trays this morning. Reports normal energy level. Poor concentration. Rates depression 7/10, anxiety 10/10, and hopelessness 0/10 (low 0-10 high). Denies SI/HI/AVH. Reported her goal for the day was "to get a nicotine patch." Safety maintained. Medication compliant.   Support and encouragement provided. Safety checks every 15 minutes. Medication administered to include IBU and Tylenol. Will continue to monitor.

## 2016-03-02 LAB — CBC
HEMATOCRIT: 38.6 % (ref 35.0–47.0)
Hemoglobin: 13.6 g/dL (ref 12.0–16.0)
MCH: 32 pg (ref 26.0–34.0)
MCHC: 35.2 g/dL (ref 32.0–36.0)
MCV: 90.7 fL (ref 80.0–100.0)
PLATELETS: 224 10*3/uL (ref 150–440)
RBC: 4.25 MIL/uL (ref 3.80–5.20)
RDW: 12.9 % (ref 11.5–14.5)
WBC: 7.1 10*3/uL (ref 3.6–11.0)

## 2016-03-02 LAB — LIPID PANEL
CHOL/HDL RATIO: 3.6 ratio
Cholesterol: 122 mg/dL (ref 0–200)
HDL: 34 mg/dL — ABNORMAL LOW (ref >40)
LDL CALC: 75 mg/dL (ref 0–99)
Triglycerides: 67 mg/dL (ref <150)
VLDL: 13 mg/dL (ref 0–40)

## 2016-03-02 LAB — TSH: TSH: 0.795 u[IU]/mL (ref 0.350–4.500)
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
## 2016-03-02 NOTE — [facility] Group Notes (Signed)
Goals Group  Date/Time: 9:00AM Type of Therapy and Topic: Group Therapy: Goals Group: SMART Goals  ?  Participation Level: Moderate  ?  Description of Group:  ?  The purpose of a daily goals group is to assist and guide patients in setting recovery/wellness-related goals. The objective is to set goals as they relate to the crisis in which they were admitted. Patients will be using SMART goal modalities to set measurable goals. Characteristics of realistic goals will be discussed and patients will be assisted in setting and processing how one will reach their goal. Facilitator will also assist patients in applying interventions and coping skills learned in psycho-education groups to the SMART goal and process how one will achieve defined goal.  ?  Therapeutic Goals:  ?  -Patients will develop and document one goal related to or their crisis in which brought them into treatment.  -Patients will be guided by LCSW using SMART goal setting modality in how to set a measurable, attainable, realistic and time sensitive goal.  -Patients will process barriers in reaching goal.  -Patients will process interventions in how to overcome and successful in reaching goal.  ?  Patient's Goal: Pt stated that her goal today is to listen to treatment team. In order to accomplish her goal, she stated that she will listen to care team and participate in treatment plans.   Therapeutic Modalities:  Motivational Interviewing  Cognitive Behavioral Therapy  Crisis Intervention Model  SMART goals setting  Hampton AbbotKadijah Roda Lauture, MSW, LCSW-A 03/02/2016, 9:48AM

## 2016-03-02 NOTE — [facility] Group Notes (Signed)
[facility] Group Notes:  (Nursing/MHT/Case Management/Adjunct)  Date:  03/02/2016  Time:  11:03 PM  Type of Therapy:  Group Therapy  Participation Level:  Active  Participation Quality:  Appropriate  Affect:  Appropriate  Cognitive:  Appropriate  Insight:  Appropriate  Engagement in Group:  Engaged  Veva Holesshley Imani Kesa Birky 03/02/2016, 11:03 PM

## 2016-03-02 NOTE — Plan of Care (Signed)
Problem: Safety: Goal: Periods of time without injury will increase Outcome: Progressing no PRN given, 15 minute checks maintained for safety, clinical and moral support provided, patient encouraged to continue to express feelings and demonstrate safe care. Patient remains free from harm, will continue to monitor.      

## 2016-03-02 NOTE — Progress Notes (Signed)
D:  Patient denies SI/AVH/HI.  Patient interacts appropriately with staff and peers.  Patient affect has been bright throughout the day. A:  Patient administered scheduled medications.  Patient offered support and encouragement.  R:  Patient safety maintained with 15 minute checks.

## 2016-03-02 NOTE — Tx Team (Signed)
Interdisciplinary Treatment and Diagnostic Plan Update  03/02/2016 Time of Session: 10:30AM Elizabeth [name] MRN: [id number]  Principal Diagnosis: Bipolar affective disorder, manic, severe (HCC)  Secondary Diagnoses: Principal Problem:   Bipolar affective disorder, manic, severe (HCC) Active Problems:   Cannabis use disorder, moderate, dependence (HCC)   Tobacco use disorder   Current Medications:  Current Facility-Administered Medications  Medication Dose Route Frequency Provider Last Rate Last Dose  . acetaminophen (TYLENOL) tablet 650 mg  650 mg Oral Q6H PRN Charm RingsJamison Y Lord, NP   650 mg at 03/02/16 0421  . alum & mag hydroxide-simeth (MAALOX/MYLANTA) 200-200-20 MG/5ML suspension 30 mL  30 mL Oral Q4H PRN Charm RingsJamison Y Lord, NP      . ARIPiprazole (ABILIFY) tablet 20 mg  20 mg Oral QHS Shari ProwsJolanta B Pucilowska, MD   20 mg at 03/01/16 2109  . carbamazepine (TEGRETOL) tablet 200 mg  200 mg Oral BID AC & HS Jolanta B Pucilowska, MD   200 mg at 03/02/16 0847  . ibuprofen (ADVIL,MOTRIN) tablet 400 mg  400 mg Oral Q6H PRN Shari ProwsJolanta B Pucilowska, MD   400 mg at 03/02/16 0650  . Influenza vac split quadrivalent PF (FLUARIX) injection 0.5 mL  0.5 mL Intramuscular Tomorrow-1000 Jolanta B Pucilowska, MD      . magnesium hydroxide (MILK OF MAGNESIA) suspension 30 mL  30 mL Oral Daily PRN Charm RingsJamison Y Lord, NP      . nicotine (NICODERM CQ - dosed in mg/24 hours) patch 21 mg  21 mg Transdermal Daily Jolanta B Pucilowska, MD   21 mg at 03/02/16 0850  . sulfamethoxazole-trimethoprim (BACTRIM DS,SEPTRA DS) 800-160 MG per tablet 1 tablet  1 tablet Oral Q12H Shari ProwsJolanta B Pucilowska, MD   1 tablet at 03/02/16 0847  . temazepam (RESTORIL) capsule 15 mg  15 mg Oral QHS Shari ProwsJolanta B Pucilowska, MD   15 mg at 03/01/16 2108   PTA Medications: Prescriptions Prior to Admission  Medication Sig Dispense Refill Last Dose  . ARIPiprazole (ABILIFY) 10 MG tablet Take 10 mg by mouth daily.   02/29/2016 at Unknown time  . ARIPiprazole ER  (ABILIFY MAINTENA) 300 MG SUSR Inject 150 mg into the muscle every 30 (thirty) days.   02/16/2016  . methocarbamol (ROBAXIN) 500 MG tablet Take 1 tablet (500 mg total) by mouth 2 (two) times daily. (Patient not taking: Reported on 02/29/2016) 20 tablet 0 Not Taking at Unknown time  . Multiple Vitamin (MULTIVITAMIN WITH MINERALS) TABS tablet Take 1 tablet by mouth daily.   02/28/2016 at Unknown time  . naproxen (NAPROSYN) 500 MG tablet Take 1 tablet (500 mg total) by mouth 2 (two) times daily. (Patient not taking: Reported on 02/29/2016) 30 tablet 0 Not Taking at Unknown time    Patient Stressors: Financial difficulties Occupational concerns Substance abuse  Patient Strengths: Barrister's clerkCommunication skills Motivation for treatment/growth Supportive family/friends  Treatment Modalities: Medication Management, Group therapy, Case management,  1 to 1 session with clinician, Psychoeducation, Recreational therapy.   Physician Treatment Plan for Primary Diagnosis: Bipolar affective disorder, manic, severe (HCC) Long Term Goal(s): Improvement in symptoms so as ready for discharge Improvement in symptoms so as ready for discharge   Short Term Goals: Ability to identify changes in lifestyle to reduce recurrence of condition will improve Ability to verbalize feelings will improve Ability to demonstrate self-control will improve Ability to identify and develop effective coping behaviors will improve Ability to maintain clinical measurements within normal limits will improve Compliance with prescribed medications will improve Ability to identify changes  in lifestyle to reduce recurrence of condition will improve Ability to demonstrate self-control will improve Ability to identify triggers associated with substance abuse/mental health issues will improve  Medication Management: Evaluate patient's response, side effects, and tolerance of medication regimen.  Therapeutic Interventions: 1 to 1 sessions, Unit  Group sessions and Medication administration.  Evaluation of Outcomes: Progressing  Physician Treatment Plan for Secondary Diagnosis: Principal Problem:   Bipolar affective disorder, manic, severe (HCC) Active Problems:   Cannabis use disorder, moderate, dependence (HCC)   Tobacco use disorder  Long Term Goal(s): Improvement in symptoms so as ready for discharge Improvement in symptoms so as ready for discharge   Short Term Goals: Ability to identify changes in lifestyle to reduce recurrence of condition will improve Ability to verbalize feelings will improve Ability to demonstrate self-control will improve Ability to identify and develop effective coping behaviors will improve Ability to maintain clinical measurements within normal limits will improve Compliance with prescribed medications will improve Ability to identify changes in lifestyle to reduce recurrence of condition will improve Ability to demonstrate self-control will improve Ability to identify triggers associated with substance abuse/mental health issues will improve     Medication Management: Evaluate patient's response, side effects, and tolerance of medication regimen.  Therapeutic Interventions: 1 to 1 sessions, Unit Group sessions and Medication administration.  Evaluation of Outcomes: Progressing   RN Treatment Plan for Primary Diagnosis: Bipolar affective disorder, manic, severe (HCC) Long Term Goal(s): Knowledge of disease and therapeutic regimen to maintain health will improve  Short Term Goals: Ability to verbalize feelings will improve, Ability to disclose and discuss suicidal ideas, Ability to identify and develop effective coping behaviors will improve and Compliance with prescribed medications will improve  Medication Management: RN will administer medications as ordered by provider, will assess and evaluate patient's response and provide education to patient for prescribed medication. RN will report any  adverse and/or side effects to prescribing provider.  Therapeutic Interventions: 1 on 1 counseling sessions, Psychoeducation, Medication administration, Evaluate responses to treatment, Monitor vital signs and CBGs as ordered, Perform/monitor CIWA, COWS, AIMS and Fall Risk screenings as ordered, Perform wound care treatments as ordered.  Evaluation of Outcomes: Progressing   LCSW Treatment Plan for Primary Diagnosis: Bipolar affective disorder, manic, severe (HCC) Long Term Goal(s): Safe transition to appropriate next level of care at discharge, Engage patient in therapeutic group addressing interpersonal concerns.  Short Term Goals: Engage patient in aftercare planning with referrals and resources, Facilitate acceptance of mental health diagnosis and concerns, Identify triggers associated with mental health/substance abuse issues and Increase skills for wellness and recovery  Therapeutic Interventions: Assess for all discharge needs, 1 to 1 time with Social worker, Explore available resources and support systems, Assess for adequacy in community support network, Educate family and significant other(s) on suicide prevention, Complete Psychosocial Assessment, Interpersonal group therapy.  Evaluation of Outcomes: Progressing   Progress in Treatment: Attending groups: Yes. Participating in groups: Yes. Taking medication as prescribed: Yes. Toleration medication: Yes. Family/Significant other contact made: Yes, individual(s) contacted:  Mother, Myra  Patient understands diagnosis: Yes. Discussing patient identified problems/goals with staff: Yes. Medical problems stabilized or resolved: Yes. Denies suicidal/homicidal ideation: Yes. Issues/concerns per patient self-inventory: No. Other:    Discharge Plan or Barriers: see above  Reason for Continuation of Hospitalization: Aggression Delusions  Hallucinations  Estimated Length of Stay:  Attendees: Patient: Elizabeth [name] 03/02/2016  10:58 AM  Physician: Dr. Jennet Maduro, MD 03/02/2016 10:58 AM  Nursing: Elenore Paddy, RN 03/02/2016 10:58 AM  RN Care Manager: 03/02/2016 10:58 AM  Social Worker: Hampton Abbot, LCSWA 03/02/2016 10:58 AM  Recreational Therapist: Hershal Coria, LRT, CTRS 03/02/2016 10:58 AM  Other: York Grice, LCSWA, LCAS 03/02/2016 10:58 AM  Other: Jake Shark, LCSWA 03/02/2016 10:58 AM  Other: 03/02/2016 10:58 AM    Scribe for Treatment Team: Lynden Oxford, LCSWA 03/02/2016 10:58 AM

## 2016-03-02 NOTE — Consult Note (Signed)
Surgical Consultation  03/02/2016  Elizabeth [name] is an 25 y.o. female.   CC: Left axillary pain  HPI: This a patient with several day history of increasing left axillary pain she's had this happen before and had spontaneous drainages in the past*on oral Bactrim for presumed axillary abscess. She denies fevers or chills. She is admitted to the psychiatry ward at this point.  Past Medical History:  Diagnosis Date  . Anxiety   . Bipolar 1 disorder [facility](HCC)     Past Surgical History:  Procedure Laterality Date  . ABSCESS DRAINAGE     left groin  . HEEL SPUR SURGERY    . right foot surgery    . TONSILLECTOMY AND ADENOIDECTOMY      Family History  Problem Relation Age of Onset  . Alcohol abuse Father   . Alcohol abuse Maternal Grandfather   . Alcohol abuse Paternal Grandfather     Social History:  reports that she has been smoking Cigarettes.  She has been smoking about 0.50 packs per day. She has never used smokeless tobacco. She reports that she drinks about 0.6 oz of alcohol per week . She reports that she uses drugs, including Marijuana.  Allergies:  Allergies  Allergen Reactions  . Depakote [Divalproex Sodium] Anaphylaxis  . Penicillins Anaphylaxis and Other (See Comments)    Has patient had a PCN reaction causing immediate rash, facial/tongue/throat swelling, SOB or lightheadedness with hypotension: Yes Has patient had a PCN reaction causing severe rash involving mucus membranes or skin necrosis: No Has patient had a PCN reaction that required hospitalization No Has patient had a PCN reaction occurring within the last 10 years: No If all of the above answers are "NO", then may proceed with Cephalosporin use.  [name]. Peanut Butter Flavor   . Risperidone And Related   . Trazodone And Nefazodone Other (See Comments)    Pt states that she does not like how this medication makes her feel.   . Vistaril [Hydroxyzine Hcl] Itching  . Other Itching, Rash and Other (See Comments)   Pt states that she is allergic to peanut butter (no reaction to peanuts)     Medications reviewed.   Review of Systems:   Review of Systems  Constitutional: Negative for chills and fever.  HENT: Negative.   Eyes: Negative.   Respiratory: Negative.   Cardiovascular: Negative.   Gastrointestinal: Negative.   Genitourinary: Negative.   Musculoskeletal: Negative.   Skin: Negative.   Neurological: Negative.   Endo/Heme/Allergies: Negative.      Physical Exam:  BP 117/74   Pulse 83   Temp 98.9 F (37.2 C)   Resp 18   Ht 5\' 6"  (1.676 m)   Wt 156 lb (70.8 kg)   LMP  (LMP Unknown)   SpO2 100%   BMI 25.18 kg/m   Physical Exam  Constitutional: She is well-developed, well-nourished, and in no distress. No distress.  HENT:  Head: Normocephalic and atraumatic.  Eyes: Right eye exhibits no discharge. Left eye exhibits no discharge. No scleral icterus.  Cardiovascular: Normal rate and regular rhythm.   Pulmonary/Chest: Effort normal. No respiratory distress.  Musculoskeletal: Normal range of motion. She exhibits no edema.  Skin: Skin is warm and dry. She is not diaphoretic. No erythema.  3 cm fluctuant mass in left axilla there is minimal erythema over it is minimally tender      Results for orders placed or performed during the hospital encounter of 02/29/16 (from the past 48 hour(s))  Lipid  panel     Status: Abnormal   Collection Time: 03/02/16  7:14 AM  Result Value Ref Range   Cholesterol 122 0 - 200 mg/dL   Triglycerides 67 <782 mg/dL   HDL 34 (L) >95 mg/dL   Total CHOL/HDL Ratio 3.6 RATIO   VLDL 13 0 - 40 mg/dL   LDL Cholesterol 75 0 - 99 mg/dL    Comment:        Total Cholesterol/HDL:CHD Risk Coronary Heart Disease Risk Table                     Men   Women  1/2 Average Risk   3.4   3.3  Average Risk       5.0   4.4  2 X Average Risk   9.6   7.1  3 X Average Risk  23.4   11.0        Use the calculated Patient Ratio above and the CHD Risk Table to  determine the patient's CHD Risk.        ATP III CLASSIFICATION (LDL):  <100     mg/dL   Optimal  621-308  mg/dL   Near or Above                    Optimal  130-159  mg/dL   Borderline  657-846  mg/dL   High  >962     mg/dL   Very High   TSH     Status: None   Collection Time: 03/02/16  7:14 AM  Result Value Ref Range   TSH 0.795 0.350 - 4.500 uIU/mL  CBC     Status: None   Collection Time: 03/02/16  7:14 AM  Result Value Ref Range   WBC 7.1 3.6 - 11.0 K/uL   RBC 4.25 3.80 - 5.20 MIL/uL   Hemoglobin 13.6 12.0 - 16.0 g/dL   HCT 95.2 84.1 - 32.4 %   MCV 90.7 80.0 - 100.0 fL   MCH 32.0 26.0 - 34.0 pg   MCHC 35.2 32.0 - 36.0 g/dL   RDW 40.1 02.7 - 25.3 %   Platelets 224 150 - 440 K/uL   No results found.  Assessment/Plan:  Left axillary abscess. She will require incision and drainage. She has not been nothing by mouth this evening therefore she will be scheduled for tomorrow I will place her on the posting line and Dr. Valaria Good will perform the surgery tomorrow. I discussed with her the rationale for offering surgery the risks of bleeding infection and wound drain placement recurrence she understood and agreed to proceed  Lattie Haw, MD, FACS

## 2016-03-02 NOTE — Progress Notes (Signed)
Pleasant on approach, almost as Euphoric, up and all about in the day room, broad-wide smiles, "I am happy! I am ready to go back home, I smoke MJ everyday with my Abilify, I think that's why I am here; I don't think I should be taking my med with marijuana .[name]..." Denies SI/HI, denies AV/H.

## 2016-03-02 NOTE — Progress Notes (Signed)
Recreation Therapy Notes  Date: 09.28.17 Time: 1:00 pm Location: Craft Room  Group Topic: Leisure Education  Goal Area(s) Addresses:  Patient will identify activities for each letter of the alphabet. Patient will verbalize ability to integrate positive leisure into life post d/c. Patient will verbalize ability to use leisure as a Associate Professorcoping skill.  Behavioral Response: Attentive, Interactive  Intervention: Leisure Alphabet  Activity: Patients were given a Leisure Information systems managerAlphabet worksheet and instructed to pick healthy leisure activities for each letter of the alphabet.  Education: LRT educated patients on what they need to participate in leisure.  Education Outcome: Acknowledges education/In group clarification offered  Clinical Observations/Feedback: Patient completed activity by writing healthy leisure activities. Patient contributed to group discussion by stating healthy leisure activities, and what she needs to participate in leisure. Patient had side conversation with peer. LRT had to redirect patient multiple times.  Jacquelynn CreeGreene,Finnick Orosz M, LRT/CTRS 03/02/2016 2:21 PM

## 2016-03-03 ENCOUNTER — Inpatient Hospital Stay: Payer: Medicaid Other | Admitting: Anesthesiology

## 2016-03-03 ENCOUNTER — Encounter: Payer: Self-pay | Admitting: Anesthesiology

## 2016-03-03 ENCOUNTER — Inpatient Hospital Stay: Admit: 2016-03-03 | Payer: Self-pay | Admitting: Internal Medicine

## 2016-03-03 ENCOUNTER — Encounter: Disposition: A | Discharge status: Home / Self Care | Payer: Self-pay | Attending: Psychiatry

## 2016-03-03 DIAGNOSIS — L02419 Cutaneous abscess of limb, unspecified: Secondary | ICD-10-CM | POA: Diagnosis present

## 2016-03-03 HISTORY — PX: INCISION AND DRAINAGE ABSCESS: SHX5864

## 2016-03-03 LAB — HEMOGLOBIN A1C
Hgb A1c MFr Bld: 5.1 % (ref 4.8–5.6)
Mean Plasma Glucose: 100 mg/dL

## 2016-03-03 SURGERY — INCISION AND DRAINAGE, ABSCESS
Anesthesia: General | Laterality: Left | Wound class: Clean Contaminated

## 2016-03-03 MED ORDER — BUPIVACAINE-EPINEPHRINE (PF) 0.25% -1:200000 IJ SOLN
INTRAMUSCULAR | Status: DC | PRN
  Administered 2016-03-03: 30 mL via PERINEURAL

## 2016-03-03 MED ORDER — PROPOFOL 10 MG/ML IV BOLUS
INTRAVENOUS | Status: DC | PRN
  Administered 2016-03-03: 200 mg via INTRAVENOUS

## 2016-03-03 MED ORDER — LIDOCAINE HCL (CARDIAC) 20 MG/ML IV SOLN
INTRAVENOUS | Status: DC | PRN
  Administered 2016-03-03: 100 mg via INTRAVENOUS

## 2016-03-03 MED ORDER — ONDANSETRON HCL 4 MG/2ML IJ SOLN
INTRAMUSCULAR | Status: DC | PRN
  Administered 2016-03-03: 4 mg via INTRAVENOUS

## 2016-03-03 MED ORDER — DEXAMETHASONE SODIUM PHOSPHATE 10 MG/ML IJ SOLN
INTRAMUSCULAR | Status: DC | PRN
  Administered 2016-03-03: 10 mg via INTRAVENOUS

## 2016-03-03 MED ORDER — FENTANYL CITRATE (PF) 100 MCG/2ML IJ SOLN
INTRAMUSCULAR | Status: DC | PRN
  Administered 2016-03-03: 100 ug via INTRAVENOUS

## 2016-03-03 MED ORDER — ONDANSETRON HCL 4 MG/2ML IJ SOLN: 4.0000 mg | Freq: Once | INTRAMUSCULAR | Status: DC | PRN

## 2016-03-03 MED ORDER — ARIPIPRAZOLE ER 400 MG IM SUSR: 400.0000 mg | INTRAMUSCULAR | Status: DC

## 2016-03-03 MED ORDER — ACETAMINOPHEN 10 MG/ML IV SOLN
INTRAVENOUS | Status: AC
  Filled 2016-03-03: qty 100

## 2016-03-03 MED ORDER — WHITE PETROLATUM GEL
Status: DC | PRN
  Administered 2016-03-03: 20:00:00 via TOPICAL
  Filled 2016-03-03: qty 5

## 2016-03-03 MED ORDER — ARIPIPRAZOLE 20 MG PO TABS: 20.0000 mg | ORAL_TABLET | Freq: Every day | ORAL | 1 refills | Status: DC

## 2016-03-03 MED ORDER — TEMAZEPAM 15 MG PO CAPS: 15.0000 mg | ORAL_CAPSULE | Freq: Every day | ORAL | 0 refills | Status: DC

## 2016-03-03 MED ORDER — SULFAMETHOXAZOLE-TRIMETHOPRIM 800-160 MG PO TABS: 1.0000 | ORAL_TABLET | Freq: Two times a day (BID) | ORAL | 0 refills | Status: DC

## 2016-03-03 MED ORDER — KETOROLAC TROMETHAMINE 30 MG/ML IJ SOLN
INTRAMUSCULAR | Status: DC | PRN
  Administered 2016-03-03: 30 mg via INTRAVENOUS

## 2016-03-03 MED ORDER — LACTATED RINGERS IV SOLN
INTRAVENOUS | Status: DC
  Administered 2016-03-03: 75 mL/h via INTRAVENOUS

## 2016-03-03 MED ORDER — CARBAMAZEPINE 200 MG PO TABS: 200.0000 mg | ORAL_TABLET | Freq: Two times a day (BID) | ORAL | 1 refills | Status: DC

## 2016-03-03 MED ORDER — LACTATED RINGERS IV SOLN
INTRAVENOUS | Status: DC | PRN
  Administered 2016-03-03: 10:00:00 via INTRAVENOUS

## 2016-03-03 MED ORDER — BUPIVACAINE-EPINEPHRINE (PF) 0.25% -1:200000 IJ SOLN
INTRAMUSCULAR | Status: AC
  Filled 2016-03-03: qty 30

## 2016-03-03 MED ORDER — CLINDAMYCIN PHOSPHATE 900 MG/50ML IV SOLN
INTRAVENOUS | Status: AC
  Filled 2016-03-03: qty 50

## 2016-03-03 MED ORDER — ACETAMINOPHEN 10 MG/ML IV SOLN
INTRAVENOUS | Status: DC | PRN
  Administered 2016-03-03: 1000 mg via INTRAVENOUS

## 2016-03-03 MED ORDER — MIDAZOLAM HCL 2 MG/2ML IJ SOLN
INTRAMUSCULAR | Status: DC | PRN
  Administered 2016-03-03: 1 mg via INTRAVENOUS

## 2016-03-03 MED ORDER — CHLORHEXIDINE GLUCONATE CLOTH 2 % EX PADS: 6.0000 | MEDICATED_PAD | Freq: Once | CUTANEOUS | Status: DC

## 2016-03-03 MED ORDER — FENTANYL CITRATE (PF) 100 MCG/2ML IJ SOLN: 25.0000 ug | INTRAMUSCULAR | Status: DC | PRN

## 2016-03-03 MED ORDER — ARIPIPRAZOLE ER 400 MG IM SUSR: 400.0000 mg | INTRAMUSCULAR | 1 refills | Status: DC

## 2016-03-03 MED ORDER — ARIPIPRAZOLE 10 MG PO TABS: 10.0000 mg | ORAL_TABLET | Freq: Every day | ORAL | Status: DC

## 2016-03-03 MED ORDER — CLINDAMYCIN PHOSPHATE 900 MG/50ML IV SOLN
900.0000 mg | Freq: Once | INTRAVENOUS | Status: AC
  Administered 2016-03-03: 900 mg via INTRAVENOUS
  Filled 2016-03-03: qty 50

## 2016-03-03 SURGICAL SUPPLY — 20 items
BLADE CLIPPER SURG (BLADE) ×2 IMPLANT
BLADE SURG 15 STRL LF DISP TIS (BLADE) ×1 IMPLANT
BLADE SURG 15 STRL SS (BLADE) ×2
CANISTER SUCT 3000ML (MISCELLANEOUS) ×2 IMPLANT
DRAPE LAPAROTOMY 77X122 PED (DRAPES) ×2 IMPLANT
ELECT REM PT RETURN 9FT ADLT (ELECTROSURGICAL) ×2
ELECTRODE REM PT RTRN 9FT ADLT (ELECTROSURGICAL) ×1 IMPLANT
GAUZE PACKING 1/2X5YD (GAUZE/BANDAGES/DRESSINGS) ×2 IMPLANT
GAUZE SPONGE 4X4 12PLY STRL (GAUZE/BANDAGES/DRESSINGS) ×2 IMPLANT
GLOVE BIO SURGEON STRL SZ7 (GLOVE) ×2 IMPLANT
GOWN STRL REUS W/ TWL LRG LVL3 (GOWN DISPOSABLE) ×2 IMPLANT
GOWN STRL REUS W/TWL LRG LVL3 (GOWN DISPOSABLE) ×4
NEEDLE HYPO 22GX1.5 SAFETY (NEEDLE) ×2 IMPLANT
NS IRRIG 1000ML POUR BTL (IV SOLUTION) ×2 IMPLANT
PACK BASIN MINOR ARMC (MISCELLANEOUS) ×2 IMPLANT
PAD ABD DERMACEA PRESS 5X9 (GAUZE/BANDAGES/DRESSINGS) ×2 IMPLANT
SCRUB POVIDONE IODINE 4 OZ (MISCELLANEOUS) ×2 IMPLANT
SOL PREP PVP 2OZ (MISCELLANEOUS) ×2
SOLUTION PREP PVP 2OZ (MISCELLANEOUS) ×1 IMPLANT
SPONGE LAP 18X18 5 PK (GAUZE/BANDAGES/DRESSINGS) ×2 IMPLANT

## 2016-03-03 NOTE — Progress Notes (Signed)
Patient NPO this morning and cooperative with plan of care for incision and drainage to keloid under right arm. No SI/HI at this time. Patient to surgery consent is signed and serum pregnancy test is conducted in surgery department. Patient returned to unit. Smiling and talkative, oriented x 4. No distress, denies discomfort. Staff phones registration to place patient and staff phones dietary for lunch tray. AM meds to be administered after lunch. Social with peers. Safety maintained.

## 2016-03-03 NOTE — Plan of Care (Signed)
Problem: Safety: Goal: Periods of time without injury will increase Outcome: Progressing 15 minute checks maintained for safety, clinical and moral support provided, patient encouraged to continue to express feelings and demonstrate safe care. Patient remains free from harm, will continue to monitor.       

## 2016-03-03 NOTE — Plan of Care (Signed)
Problem: Education: Goal: Knowledge of [facility] Education information/materials will improve Outcome: Progressing Actively participating in plan of care.    

## 2016-03-03 NOTE — Anesthesia Preprocedure Evaluation (Addendum)
Anesthesia Evaluation  Patient identified by MRN, date of birth, ID band Patient awake    Reviewed: Allergy & Precautions, NPO status , Patient's Chart, lab work & pertinent test results  Airway Mallampati: I  TM Distance: >3 FB     Dental  (+) Chipped   Pulmonary asthma , Current Smoker,    Pulmonary exam normal        Cardiovascular negative cardio ROS Normal cardiovascular exam     Neuro/Psych PSYCHIATRIC DISORDERS Anxiety Bipolar Disorder    GI/Hepatic negative GI ROS, (+)     substance abuse  alcohol use, cocaine use and marijuana use,   Endo/Other  negative endocrine ROS  Renal/GU   negative genitourinary   Musculoskeletal   Abdominal Normal abdominal exam  (+)   Peds negative pediatric ROS (+)  Hematology negative hematology ROS (+)   Anesthesia Other Findings   Reproductive/Obstetrics                            Anesthesia Physical Anesthesia Plan  ASA: III  Anesthesia Plan: General   Post-op Pain Management:    Induction:   Airway Management Planned: Oral ETT  Additional Equipment:   Intra-op Plan:   Post-operative Plan: Extubation in OR  Informed Consent: I have reviewed the patients History and Physical, chart, labs and discussed the procedure including the risks, benefits and alternatives for the proposed anesthesia with the patient or authorized representative who has indicated his/her understanding and acceptance.   Dental advisory given  Plan Discussed with: CRNA and Surgeon  Anesthesia Plan Comments:         Anesthesia Quick Evaluation

## 2016-03-03 NOTE — Transfer of Care (Signed)
Immediate Anesthesia Transfer of Care Note  Patient: Elizabeth BeckBrittney L Klink  Procedure(s) Performed: Procedure(s): INCISION AND DRAINAGE ABSCESS (Left)  Patient Location: PACU  Anesthesia Type:General  Level of Consciousness: awake and alert   Airway & Oxygen Therapy: Patient Spontanous Breathing and Patient connected to nasal cannula oxygen  Post-op Assessment: Report given to RN and Post -op Vital signs reviewed and stable  Post vital signs: Reviewed and stable  Last Vitals:  Vitals:   03/03/16 0938 03/03/16 1044  BP:  112/70  Pulse: 72 (!) 114  Resp: 16 16  Temp: (!) 36 C 36.2 C    Last Pain:  Vitals:   03/03/16 0938  TempSrc: Tympanic  PainSc: 7       Patients Stated Pain Goal: 1 (03/03/16 [id number])  Complications: No apparent anesthesia complications

## 2016-03-03 NOTE — Op Note (Signed)
  02/29/2016 - 03/03/2016  10:25 AM  PATIENT:  [name]  25 y.o. female  PRE-OPERATIVE DIAGNOSIS: Left axillary abscess  POST-OPERATIVE DIAGNOSIS:  Same  PROCEDURE:  1. Incision and drainage of complex axillary abscess                           2. Excisional debridement of skin subcutaneous tissue measuring 12 square centimeters    SURGEON:  Surgeon(s) and Role:    * Diego F Pabon, MD - Primary   ANESTHESIA: GETA  INDICATIONS FOR PROCEDURE Pilonidal abscess  DICTATION:  Patient was playing about proceeding detail, risk benefits possible complications and a consent was obtained. The patient taken to the operating room and placed in the supine positioni. Elli[ptical incision was created and pus was drained and cultured. There were complex luculations that we were able to lyse with a combination of finger fracture and suction device. All the loculations were broken down. Using a sharp curette we debrided the sub q tissue . Hemostasis was obtained with electrocautery. Irrigation with normal saline and the wound was packed with half-inch packing. Marcaine quarter percent with epinephrine was injected around the wound site. Needle and laparotomy counts were correct and there were no immediate complications  Leafy Roiego F Pabon, MD

## 2016-03-03 NOTE — Progress Notes (Signed)
Preoperative Review   Patient is met in the preoperative holding area. The history is reviewed in the chart and with the patient. I personally reviewed the options and rationale as well as the risks of this procedure that have been previously discussed with the patient. All questions asked by the patient and/or family were answered to their satisfaction.  Patient agrees to proceed with this procedure at this time.  Teriann Livingood M.D. FACS   

## 2016-03-03 NOTE — Progress Notes (Signed)
Writer to patient room at patient request. Patient dressing unsecured with packing hanging down outside of shirt. Patient to bed to lay down and apply pressure. Writer phones Dr. Everlene FarrierPabon at x [phone] and he Art therapistinstructs writer to apply pressure for 5 minutes, remove original dressing and redress with 4 by 4 s and secure with tape.  Moderate amount of blood stops with pressure. Bleeding slows and stops when pressure is applied. Additional supplies ordered from central supply. Writer phones MD to come look at dressing and surgical incision and drainage when he is available. Safety maintained at this time.
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
## 2016-03-03 NOTE — [facility] Group Notes (Signed)
[facility] LCSW Group Therapy   03/03/2016 9:30 am *Late Entry  Type of Therapy: Group Therapy   Participation Level: Active   Participation Quality: Attentive, Sharing and Supportive   Affect: Appropriate   Cognitive: Alert and Oriented   Insight: Developing/Improving and Engaged   Engagement in Therapy: Developing/Improving and Engaged   Modes of Intervention: Clarification, Confrontation, Discussion, Education, Exploration, Limit-setting, Orientation, Problem-solving, Rapport Building, Dance movement psychotherapisteality Testing, Socialization and Support   Summary of Progress/Problems: The topic for group was balance in life. Today's group focused on defining balance in one's own words, identifying things that can knock one off balance, and exploring healthy ways to maintain balance in life. Group members were asked to provide an example of a time when they felt off balance, describe how they handled that situation, and process healthier ways to regain balance in the future. Group members were asked to share the most important tool for maintaining balance that they learned while at [facility] and how they plan to apply this method after discharge.  Pt shared with the group that in order to maintain balance in the pt's life the pt tries to "take things one day at a time". Pt shared that "thinking too far ahead" can "knock the pt off balance"  Pt shared that when the pt attempted to complete too many daily activities, this resulted in the pt "feeling off balance" and that being focused and task-centered was what the pt found to be successful for the pt.  Pt was polite and cooperative with the CSW and other group members and focused and attentive to the topics discussed and the sharing of others.  03/02/16 *Late Entry

## 2016-03-03 NOTE — Anesthesia Procedure Notes (Signed)
Procedure Name: Intubation Date/Time: 03/03/2016 9:59 AM Performed by: Marlana SalvageJESSUP, Kameka Whan Pre-anesthesia Checklist: Patient identified, Emergency Drugs available, Suction available and Patient being monitored Patient Re-evaluated:Patient Re-evaluated prior to inductionOxygen Delivery Method: Circle system utilized Preoxygenation: Pre-oxygenation with 100% oxygen Intubation Type: IV induction, Rapid sequence and Cricoid Pressure applied Laryngoscope Size: Mac and 3 Grade View: Grade I Tube size: 7.0 mm Number of attempts: 1 Airway Equipment and Method: Stylet Placement Confirmation: ETT inserted through vocal cords under direct vision,  positive ETCO2 and breath sounds checked- equal and bilateral Secured at: 22 cm Tube secured with: Tape Dental Injury: Teeth and Oropharynx as per pre-operative assessment

## 2016-03-03 NOTE — [facility] Group Notes (Signed)
[facility] LCSW Group Therapy   03/03/2016 1:00 PM   Type of Therapy: Group Therapy   Participation Level: Active   Participation Quality: Attentive, Sharing and Supportive   Affect: Appropriate   Cognitive: Alert and Oriented   Insight: Developing/Improving and Engaged   Engagement in Therapy: Developing/Improving and Engaged   Modes of Intervention: Clarification, Confrontation, Discussion, Education, Exploration, Limit-setting, Orientation, Problem-solving, Rapport Building, Dance movement psychotherapisteality Testing, Socialization and Support   Summary of Progress/Problems: The topic for today was feelings about relapse. Pt discussed what relapse prevention is to them and identified triggers that they are on the path to relapse. Pt processed their feeling towards relapse and was able to relate to peers. Pt discussed coping skills that can be used for relapse prevention. Pt defined relapse as "doing something well and having a setback." She stated that her intrusive symptoms are rage, attitudes, and toxic environments. Pt identified surrounding herself around positive people and loving everyone as ways to prevent relapse.    Hampton AbbotKadijah Aradhana Gin, MSW, LCSWA 03/03/2016, 1:57PM

## 2016-03-03 NOTE — Progress Notes (Signed)
In order to maintain a safe milieu and reduced inappropriate behaviors, patient was moved closer to the nurses' station for close observations, limit setting and re-direction. "I was just flirting to while away the time, it is boring here; and besides, I wouldn't do anything because of my "flow".......I gave him my phone number because he promised to get my father I-phone wrist watch and I Pad.." Nursing staffs will continue to provide clinical support and maintain safety. Patient is still very manic, euphoric, disorganized, poor insight and judgement, asking for incision and drainage of her left axillary furuncle (boil).

## 2016-03-04 MED ORDER — MENTHOL 3 MG MT LOZG
1.0000 | LOZENGE | OROMUCOSAL | Status: DC | PRN
  Administered 2016-03-04 – 2016-03-05 (×3): 3 mg via ORAL
  Filled 2016-03-04: qty 9

## 2016-03-04 NOTE — [facility] Group Notes (Signed)
[facility] LCSW Group Therapy  03/04/2016 1:59 PM  Type of Therapy:  Group Therapy  Participation Level:  Active  Participation Quality:  Appropriate and Attentive  Affect:  Appropriate  Cognitive:  Alert  Insight:  Engaged  Engagement in Therapy:  Engaged  Modes of Intervention:  Activity, Discussion, Education and Support  Summary of Progress/Problems:Balance in life: Patients will discuss the concept of balance and how it looks and feels to be unbalanced. Pt will identify areas in their life that is unbalanced and ways to become more balanced. Patient stated that her family relationships could be improved by better communication with her parents.  Elizabeth Dandridge G. Garnette CzechSampson MSW, LCSWA 03/04/2016, 2:03 PM

## 2016-03-04 NOTE — Progress Notes (Signed)
Pt seen and examined around 740 pm last night. Apparently some Bleeding from wound . Pt was playing basketball and moving her arm shortly after the procedure. Pressure applied and bleeding stopped Currently in good spirits, ambulating and in NAD Wound w dressing in place no active bleeding  A/P recommend starting daily packing today We will be happy to follow as outpt

## 2016-03-04 NOTE — Progress Notes (Signed)
Pt reports that she feels like she is getting a cold. Requests for temperature and BP to be taken. Temp 98.4 and BP 117/83. Will continue to monitor.
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
## 2016-03-04 NOTE — Plan of Care (Signed)
Problem: Education: Goal: Emotional status will improve Outcome: Progressing Pt affect is bright. She denies SI at this time.  Problem: Safety: Goal: Periods of time without injury will increase Outcome: Progressing Pt remains free from harm.

## 2016-03-04 NOTE — [facility] Group Notes (Signed)
[facility] Group Notes:  (Nursing/MHT/Case Management/Adjunct)  Date:  03/04/2016  Time:  9:32 PM  Type of Therapy:  Psychoeducational Skills  Participation Level:  Active  Participation Quality:  Appropriate  Affect:  Appropriate  Cognitive:  Alert  Insight:  Good  Engagement in Group:  Engaged  Modes of Intervention:  Activity and Support  Summary of Progress/Problems:  Elizabeth [name] 03/04/2016, 9:32 PM

## 2016-03-04 NOTE — [facility] Group Notes (Signed)
[facility] Group Notes:  (Nursing/MHT/Case Management/Adjunct)  Date:  03/04/2016  Time:  5:05 AM  Type of Therapy:  Psychoeducational Skills  Participation Level:  Active  Participation Quality:  Appropriate, Attentive and Sharing  Affect:  Appropriate  Cognitive:  Appropriate  Insight:  Appropriate and Good  Engagement in Group:  Engaged  Modes of Intervention:  Discussion, Socialization and Support  Summary of Progress/Problems:  Chancy MilroyLaquanda Y Verland Sprinkle 03/04/2016, 5:05 AM

## 2016-03-04 NOTE — Progress Notes (Signed)
Pt has been pleasant and cooperative. Both mood and affect has been brighter this period. Pt has attended all unit activities with appropriate participation. Pt denies SI and A/V hallucinations.

## 2016-03-04 NOTE — Progress Notes (Signed)
D: Pt affect is bright this evening. She interacts appropriately with staff and peers. 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 Denies SI/HI/AVH at this time. Dressing on pt's left axilla changed as ordered. Dry blood noted on previous dressing. Pt continues to c/o cold symptoms. She is seen pacing the halls periodically throughout the night. A: Emotional support and encouragement provided. Medications administered with education. q15 minute safety checks maintained. R: Pt remains free from harm. Will continue to monitor.

## 2016-03-05 MED ORDER — SULFAMETHOXAZOLE-TRIMETHOPRIM 800-160 MG PO TABS: 1.0000 | ORAL_TABLET | Freq: Two times a day (BID) | ORAL | 0 refills | Status: DC

## 2016-03-05 MED ORDER — CARBAMAZEPINE 200 MG PO TABS: 200.0000 mg | ORAL_TABLET | Freq: Two times a day (BID) | ORAL | 0 refills | Status: DC

## 2016-03-05 MED ORDER — ARIPIPRAZOLE 20 MG PO TABS: 20.0000 mg | ORAL_TABLET | Freq: Every day | ORAL | 0 refills | Status: DC

## 2016-03-05 MED ORDER — TEMAZEPAM 15 MG PO CAPS: 15.0000 mg | ORAL_CAPSULE | Freq: Every day | ORAL | 0 refills | Status: DC

## 2016-03-05 MED ORDER — ADULT MULTIVITAMIN W/MINERALS CH: 1.0000 | ORAL_TABLET | Freq: Every day | ORAL | 0 refills | Status: DC

## 2016-03-05 NOTE — Plan of Care (Signed)
Problem: Health Behavior/Discharge Planning: Goal: Compliance with treatment plan for underlying cause of condition will improve Outcome: Progressing Pt is active on the unit. She attends groups and takes medications as prescribed.

## 2016-03-05 NOTE — Progress Notes (Signed)
Dressing changed on pt's left axilla. Small amount of dried drainage noted on previous dressing. Pt tolerates dressing change well.

## 2016-03-05 NOTE — Progress Notes (Signed)
D: Pt is pleasant and cooperative this evening. She is active in the milieu. Reports that she is looking forward to d/c. Denies SI/HI/AVH at this time. A: Emotional support an encouragement provided. Medications administered with education. q15 minute safety checks maintained. R: Pt remains free from harm. Will continue to monitor.

## 2016-03-05 NOTE — Tx Team (Signed)
Interdisciplinary Treatment and Diagnostic Plan Update  03/05/2016 Time of Session: 10:00am Elizabeth [name] MRN: [id number]  Principal Diagnosis: Bipolar affective disorder, manic, severe (HCC)  Secondary Diagnoses: Principal Problem:   Bipolar affective disorder, manic, severe (HCC) Active Problems:   Cannabis use disorder, moderate, dependence (HCC)   Tobacco use disorder   Axillary abscess   Current Medications:  Current Facility-Administered Medications  Medication Dose Route Frequency Provider Last Rate Last Dose  . acetaminophen (TYLENOL) tablet 650 mg  650 mg Oral Q6H PRN Charm Rings, NP   650 mg at 03/04/16 1830  . alum & mag hydroxide-simeth (MAALOX/MYLANTA) 200-200-20 MG/5ML suspension 30 mL  30 mL Oral Q4H PRN Charm Rings, NP      . ARIPiprazole (ABILIFY) tablet 20 mg  20 mg Oral QHS Shari Prows, MD   20 mg at 03/04/16 2108  . [START ON 03/15/2016] ARIPiprazole ER SUSR 400 mg  400 mg Intramuscular Q28 days Jolanta B Pucilowska, MD      . carbamazepine (TEGRETOL) tablet 200 mg  200 mg Oral BID AC & HS Jolanta B Pucilowska, MD   200 mg at 03/05/16 1191  . ibuprofen (ADVIL,MOTRIN) tablet 400 mg  400 mg Oral Q6H PRN Shari Prows, MD   400 mg at 03/03/16 1557  . Influenza vac split quadrivalent PF (FLUARIX) injection 0.5 mL  0.5 mL Intramuscular Tomorrow-1000 Jolanta B Pucilowska, MD      . magnesium hydroxide (MILK OF MAGNESIA) suspension 30 mL  30 mL Oral Daily PRN Charm Rings, NP      . menthol-cetylpyridinium (CEPACOL) lozenge 3 mg  1 lozenge Oral PRN Audery Amel, MD   3 mg at 03/05/16 0927  . nicotine (NICODERM CQ - dosed in mg/24 hours) patch 21 mg  21 mg Transdermal Daily Shari Prows, MD   21 mg at 03/04/16 0916  . sulfamethoxazole-trimethoprim (BACTRIM DS,SEPTRA DS) 800-160 MG per tablet 1 tablet  1 tablet Oral Q12H Shari Prows, MD   1 tablet at 03/05/16 0813  . temazepam (RESTORIL) capsule 15 mg  15 mg Oral QHS Shari Prows, MD   15 mg at 03/04/16 2108  . white petrolatum (VASELINE) gel   Topical PRN Shari Prows, MD       PTA Medications: Prescriptions Prior to Admission  Medication Sig Dispense Refill Last Dose  . ARIPiprazole (ABILIFY) 10 MG tablet Take 10 mg by mouth daily.   02/29/2016 at Unknown time  . ARIPiprazole ER (ABILIFY MAINTENA) 300 MG SUSR Inject 150 mg into the muscle every 30 (thirty) days.   02/16/2016  . methocarbamol (ROBAXIN) 500 MG tablet Take 1 tablet (500 mg total) by mouth 2 (two) times daily. (Patient not taking: Reported on 02/29/2016) 20 tablet 0 Not Taking at Unknown time  . naproxen (NAPROSYN) 500 MG tablet Take 1 tablet (500 mg total) by mouth 2 (two) times daily. (Patient not taking: Reported on 02/29/2016) 30 tablet 0 Not Taking at Unknown time  . [DISCONTINUED] Multiple Vitamin (MULTIVITAMIN WITH MINERALS) TABS tablet Take 1 tablet by mouth daily.   02/28/2016 at Unknown time    Patient Stressors: Financial difficulties Occupational concerns Substance abuse  Patient Strengths: Barrister's clerk for treatment/growth Supportive family/friends  Treatment Modalities: Medication Management, Group therapy, Case management,  1 to 1 session with clinician, Psychoeducation, Recreational therapy.   Physician Treatment Plan for Primary Diagnosis: Bipolar affective disorder, manic, severe (HCC) Long Term Goal(s): Improvement in symptoms so as ready  for discharge Improvement in symptoms so as ready for discharge   Short Term Goals: Ability to identify changes in lifestyle to reduce recurrence of condition will improve Ability to verbalize feelings will improve Ability to demonstrate self-control will improve Ability to identify and develop effective coping behaviors will improve Ability to maintain clinical measurements within normal limits will improve Compliance with prescribed medications will improve Ability to identify changes in lifestyle to  reduce recurrence of condition will improve Ability to demonstrate self-control will improve Ability to identify triggers associated with substance abuse/mental health issues will improve  Medication Management: Evaluate patient's response, side effects, and tolerance of medication regimen.  Therapeutic Interventions: 1 to 1 sessions, Unit Group sessions and Medication administration.  Evaluation of Outcomes: Adequate for Discharge  Physician Treatment Plan for Secondary Diagnosis: Principal Problem:   Bipolar affective disorder, manic, severe (HCC) Active Problems:   Cannabis use disorder, moderate, dependence (HCC)   Tobacco use disorder   Axillary abscess  Long Term Goal(s): Improvement in symptoms so as ready for discharge Improvement in symptoms so as ready for discharge   Short Term Goals: Ability to identify changes in lifestyle to reduce recurrence of condition will improve Ability to verbalize feelings will improve Ability to demonstrate self-control will improve Ability to identify and develop effective coping behaviors will improve Ability to maintain clinical measurements within normal limits will improve Compliance with prescribed medications will improve Ability to identify changes in lifestyle to reduce recurrence of condition will improve Ability to demonstrate self-control will improve Ability to identify triggers associated with substance abuse/mental health issues will improve     Medication Management: Evaluate patient's response, side effects, and tolerance of medication regimen.  Therapeutic Interventions: 1 to 1 sessions, Unit Group sessions and Medication administration.  Evaluation of Outcomes: Adequate for Discharge   RN Treatment Plan for Primary Diagnosis: Bipolar affective disorder, manic, severe (HCC) Long Term Goal(s): Knowledge of disease and therapeutic regimen to maintain health will improve  Short Term Goals: Ability to verbalize feelings  will improve, Ability to disclose and discuss suicidal ideas, Ability to identify and develop effective coping behaviors will improve and Compliance with prescribed medications will improve  Medication Management: RN will administer medications as ordered by provider, will assess and evaluate patient's response and provide education to patient for prescribed medication. RN will report any adverse and/or side effects to prescribing provider.  Therapeutic Interventions: 1 on 1 counseling sessions, Psychoeducation, Medication administration, Evaluate responses to treatment, Monitor vital signs and CBGs as ordered, Perform/monitor CIWA, COWS, AIMS and Fall Risk screenings as ordered, Perform wound care treatments as ordered.  Evaluation of Outcomes: Adequate for Discharge   LCSW Treatment Plan for Primary Diagnosis: Bipolar affective disorder, manic, severe (HCC) Long Term Goal(s): Safe transition to appropriate next level of care at discharge, Engage patient in therapeutic group addressing interpersonal concerns.  Short Term Goals: Engage patient in aftercare planning with referrals and resources, Facilitate acceptance of mental health diagnosis and concerns, Identify triggers associated with mental health/substance abuse issues and Increase skills for wellness and recovery  Therapeutic Interventions: Assess for all discharge needs, 1 to 1 time with Social worker, Explore available resources and support systems, Assess for adequacy in community support network, Educate family and significant other(s) on suicide prevention, Complete Psychosocial Assessment, Interpersonal group therapy.  Evaluation of Outcomes: Adequate for Discharge   Progress in Treatment: Attending groups: Yes. Participating in groups: Yes. Taking medication as prescribed: Yes. Toleration medication: Yes. Family/Significant other contact made: Yes, individual(s)  contacted:  mother Patient understands diagnosis:  Yes. Discussing patient identified problems/goals with staff: Yes. Medical problems stabilized or resolved: Yes. Denies suicidal/homicidal ideation: Yes. Issues/concerns per patient self-inventory: No. Other: n/a  New problem(s) identified: None identified at this time.  New Short Term/Long Term Goal(s): None identified at this time.   Discharge Plan or Barriers: Patient will discharge home to live with her parents and has follow-up care with [facility] in [place].   Reason for Continuation of Hospitalization: Anticipated discharge 03/05/2016.  Estimated Length of Stay: Anticipated discharge 03/05/2016  Attendees: Patient:Elizabeth [name] 03/05/2016 10:14 AM  Physician: Mordecai Rasmussen, MD 03/05/2016 10:14 AM  Nursing: Carlus Pavlov, RN 03/05/2016 10:14 AM  RN Care Manager: 03/05/2016 10:14 AM  Social Worker: Fredrich Birks. Garnette Czech MSW, LCSWA 03/05/2016 10:14 AM  Recreational Therapist:  03/05/2016 10:14 AM  Other:  03/05/2016 10:14 AM  Other:  03/05/2016 10:14 AM  Other: 03/05/2016 10:14 AM    Scribe for Treatment Team: Arelia Longest, LCSWA 03/05/2016 10:21 AM
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
## 2016-03-05 NOTE — Progress Notes (Signed)
  [facility] :  Will you be returning to the same living situation after discharge:  Yes,  patient will be living with her mother. At discharge, do you have transportation home?: Yes,  mother Do you have the ability to pay for your medications: Yes,  patient has financial support from her mother.  Release of information consent forms completed and in the chart;  Patient's signature needed at discharge.  Patient to Follow up at: Follow-up Information    [facility]. Go on 03/08/2016.   Why:  [facility] at [facility] with your discharge paperwork. At this appointment, they will do a hospital follow-up assessment and medication management. Contact information: Address: [address] [place], [place] [address] Phone: [phone](336) [phone] Fax: [phone]) [phone]       Leafy Roiego F Pabon, MD Follow up on 03/20/2016.   Specialty:  General Surgery Contact information: [address] [place] [address] [phone]           Next level of care provider has access to [facility] Link:no  Safety Planning and Suicide Prevention discussed: Yes,  with mother and patient.  Have you used any form of tobacco in the last 30 days? (Cigarettes, Smokeless Tobacco, Cigars, and/or Pipes): Yes  Has patient been referred to the Quitline?: Patient refused referral  Patient has been referred for addiction treatment: Yes  Dudley Mages G. Garnette CzechSampson MSW, LCSWA 03/05/2016, 10:23 AM

## 2016-03-05 NOTE — Progress Notes (Signed)
Patient denies SI/HI, denies A/V hallucinations. Patient verbalizes understanding of discharge instructions, follow up care and prescriptions. Patient given all belongings from personal locker. Patient escorted out by staff.  
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
## 2016-03-06 ENCOUNTER — Encounter: Payer: Self-pay | Admitting: Surgery

## 2016-03-06 LAB — SURGICAL PATHOLOGY

## 2016-03-06 NOTE — Anesthesia Postprocedure Evaluation (Signed)
Anesthesia Post Note  Patient: Elizabeth [name]  Procedure(s) Performed: Procedure(s) (LRB): INCISION AND DRAINAGE ABSCESS (Left)  Patient location during evaluation: PACU Anesthesia Type: General Level of consciousness: awake and alert and oriented Pain management: pain level controlled Vital Signs Assessment: post-procedure vital signs reviewed and stable Respiratory status: spontaneous breathing Cardiovascular status: blood pressure returned to baseline Anesthetic complications: no    Last Vitals:  Vitals:   03/04/16 0533 03/05/16 0656  BP: 113/70 117/69  Pulse: 72 78  Resp: 16 18  Temp: 37.2 C 36.8 C    Last Pain:  Vitals:   03/05/16 0656  TempSrc: Oral  PainSc:                  [name]

## 2016-03-08 LAB — AEROBIC/ANAEROBIC CULTURE (SURGICAL/DEEP WOUND)

## 2016-03-08 LAB — AEROBIC/ANAEROBIC CULTURE W GRAM STAIN (SURGICAL/DEEP WOUND): Culture: NORMAL

## 2016-07-01 DIAGNOSIS — J069 Acute upper respiratory infection, unspecified: Secondary | ICD-10-CM

## 2016-07-01 NOTE — ED Triage Notes (Signed)
Pt. Complains of body aches, cough, congestion and headache for the past 3 days.

## 2016-07-01 NOTE — ED Provider Notes (Signed)
[place]  [facility]      9:23 PM    Date: 07/01/2016  Patient Name: Janet Pugh    History of Presenting Illness     Chief Complaint   Patient presents with   ??? Cough   ??? Nasal Congestion   ??? Fever   ??? Headache   ??? Generalized Body Aches       History Provided By: Patient    Chief Complaint: URI, body aches  Duration:  Days  Timing:  Constant  Location: diffuse body aches  Quality: Aching  Severity: Moderate  Modifying Factors: none   Associated Symptoms: cough, nasal congestion, body aches    26 y.o. female presents to the ED c/o nasal congestion, cough, and body aches since last evening.  Pt notes her son has similar symptoms.  Her cough is dry, and nonproductive.  Denies fever, chills, SOB, or other symptoms at this time.     No other complaints.     Nursing nurses regarding the HPI and triage nursing notes were reviewed.     Prior medical records were reviewed.     Current Outpatient Prescriptions   Medication Sig Dispense Refill   ??? guaiFENesin-codeine (ROBITUSSIN AC) 100-10 mg/5 mL solution Take 5 mL by mouth nightly as needed for Cough. Max Daily Amount: 5 mL. 120 mL 0       Past History     Past Medical History:  Past Medical History:   Diagnosis Date   ??? Eczema        Past Surgical History:  Past Surgical History:   Procedure Laterality Date   ??? HX ANKLE FRACTURE TX      with reconstruction   ??? HX CESAREAN SECTION  2015       Family History:  History reviewed. No pertinent family history.    Social History:  Social History   Substance Use Topics   ??? Smoking status: Current Every Day Smoker     Packs/day: 0.25   ??? Smokeless tobacco: Never Used   ??? Alcohol use No       Allergies:  No Known Allergies    Patient's primary care provider (as noted in EPIC):  None    Constitutional:  Denies malaise, fever, chills.   ENMT:  + nasal congestion, sore throat.   Respiratory:  + dry cough.  Denies wheezing, difficulty breathing, shortness of breath.   Skin: Denies injury, rash, itching or skin changes.   All other systems negative as reviewed.     Visit Vitals   ??? BP (!) 136/95   ??? Pulse 78   ??? Temp 98.3 ??F (36.8 ??C)   ??? Resp 20   ??? Ht 5\' 3"  (1.6 m)   ??? Wt 92.1 kg (203 lb)   ??? SpO2 98%   ??? BMI 35.96 kg/m2       PHYSICAL EXAM:    CONSTITUTIONAL:  Alert, in no apparent distress;  well developed;  well nourished.  HEAD:  Normocephalic, atraumatic.  EYES:  EOMI.  Non-icteric sclera.  Normal conjunctiva.  ENTM:  Nose:  Nasal turbinates erythematous and edematous, mild clear rhinorrhea.  Throat:  mild erythema, without edema or exudate, mucous membranes moist, uvula midline, airway patent.   NECK: Supple  RESPIRATORY:  Chest clear, equal breath sounds, good air movement.  Without wheezes, rhonchi or rales.   CARDIOVASCULAR:  Regular rate and rhythm.  No murmurs, rubs, or gallops.  NEURO:  Moves all four extremities, and  grossly normal motor exam.  SKIN:  No rashes;  Normal for age.  PSYCH:  Alert and normal affect.    DIFFERENTIAL DIAGNOSES/ MEDICAL DECISION MAKING:  Viral upper respiratory infection, acute bronchitis, influenza/ flu, pneumonia, other etiologies versus a combination of the above (ex. uri on top of pneumonia).    IMPRESSION AND MEDICAL DECISION MAKING:  Based upon the patient's presentation with noted HPI and PE, along with the work up done in the emergency department, I believe that the patient is having an upper respiratory infection, yet no signs of bronchitis nor pneumonia.  Will write for robitussin ac and have f/u with PCP.  Instructions to rest, drink plenty of fluids, and f/u as directed.     Diagnosis:   1. Acute upper respiratory infection    2. Cough      Disposition: Discharge    Follow-up Information     Follow up With Details Comments Contact Info    [place] [facility] [place] In 3 days  [address] [address]  [phone]    [facility]  If symptoms worsen [address] [place] [address]  [phone]           Patient's Medications   Start Taking    GUAIFENESIN-CODEINE (ROBITUSSIN AC) 100-10 MG/5 ML SOLUTION    Take 5 mL by mouth nightly as needed for Cough. Max Daily Amount: 5 mL.   Continue Taking    No medications on file   These Medications have changed    No medications on file   Stop Taking    GABAPENTIN (NEURONTIN) 100 MG CAPSULE    Take one capsule bid for 2 days, then take one capsule tid for 3 days, then take 2 capsules tid for 7 days, then 3 capsules tid    IBUPROFEN (MOTRIN) 800 MG TABLET    800 mg.    NORETHINDRONE (SHAROBEL) 0.35 MG TABLET    Take  by mouth.     Nicolette Bang, PA

## 2016-07-01 NOTE — ED Notes (Signed)
I have reviewed discharge instructions with the patient.  The patient verbalized understanding.Patient armband removed and given to patient to take home.  Patient was informed of the privacy risks if armband lost or stolen  Current Discharge Medication List      START taking these medications    Details   guaiFENesin-codeine (ROBITUSSIN AC) 100-10 mg/5 mL solution Take 5 mL by mouth nightly as needed for Cough. Max Daily Amount: 5 mL.  Qty: 120 mL, Refills: 0    Associated Diagnoses: Cough

## 2016-07-02 ENCOUNTER — Inpatient Hospital Stay
Admit: 2016-07-02 | Discharge: 2016-07-02 | Disposition: A | Discharge status: Home / Self Care | Payer: Self-pay | Attending: Emergency Medicine

## 2016-07-02 MED ORDER — CODEINE-GUAIFENESIN 10 MG-100 MG/5 ML ORAL LIQUID
100-10 mg/5 mL | Freq: Every evening | ORAL | 0 refills | Status: DC | PRN
Start: 2016-07-02 — End: 2017-08-01

## 2016-09-09 ENCOUNTER — Emergency Department (HOSPITAL_COMMUNITY)
Admission: EM | Admit: 2016-09-09 | Discharge: 2016-09-09 | Disposition: A | Discharge status: Home / Self Care | Payer: Self-pay | Attending: Emergency Medicine | Admitting: Emergency Medicine

## 2016-09-09 ENCOUNTER — Encounter (HOSPITAL_COMMUNITY): Payer: Self-pay

## 2016-09-09 DIAGNOSIS — J45909 Unspecified asthma, uncomplicated: Secondary | ICD-10-CM | POA: Insufficient documentation

## 2016-09-09 DIAGNOSIS — F17210 Nicotine dependence, cigarettes, uncomplicated: Secondary | ICD-10-CM | POA: Insufficient documentation

## 2016-09-09 DIAGNOSIS — H6123 Impacted cerumen, bilateral: Secondary | ICD-10-CM | POA: Insufficient documentation

## 2016-09-09 NOTE — ED Notes (Signed)
Cleaned out both of pt's ears, reports no more pain and improved hearing bilat.
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
## 2016-09-09 NOTE — ED Triage Notes (Signed)
Pt reports right ear pain since getting water inside of it earlier this evening. Pt A+OX4, speaking in complete sentences, ambulatory to triage.

## 2017-06-11 ENCOUNTER — Emergency Department (HOSPITAL_COMMUNITY)
Admission: EM | Admit: 2017-06-11 | Discharge: 2017-06-11 | Disposition: A | Discharge status: Home / Self Care | Payer: No Typology Code available for payment source | Attending: Emergency Medicine | Admitting: Emergency Medicine

## 2017-06-11 ENCOUNTER — Encounter (HOSPITAL_COMMUNITY): Payer: Self-pay | Admitting: Emergency Medicine

## 2017-06-11 ENCOUNTER — Emergency Department (HOSPITAL_COMMUNITY): Payer: No Typology Code available for payment source

## 2017-06-11 DIAGNOSIS — Z91010 Allergy to peanuts: Secondary | ICD-10-CM | POA: Insufficient documentation

## 2017-06-11 DIAGNOSIS — M545 Low back pain, unspecified: Secondary | ICD-10-CM

## 2017-06-11 DIAGNOSIS — M542 Cervicalgia: Secondary | ICD-10-CM | POA: Diagnosis not present

## 2017-06-11 DIAGNOSIS — F17210 Nicotine dependence, cigarettes, uncomplicated: Secondary | ICD-10-CM | POA: Insufficient documentation

## 2017-06-11 DIAGNOSIS — M791 Myalgia, unspecified site: Secondary | ICD-10-CM | POA: Diagnosis not present

## 2017-06-11 DIAGNOSIS — J45909 Unspecified asthma, uncomplicated: Secondary | ICD-10-CM | POA: Insufficient documentation

## 2017-06-11 LAB — POC URINE PREG, ED: Preg Test, Ur: NEGATIVE

## 2017-06-11 MED ORDER — NAPROXEN 500 MG PO TABS: 500.0000 mg | ORAL_TABLET | Freq: Two times a day (BID) | ORAL | 0 refills | Status: DC

## 2017-06-11 MED ORDER — METHOCARBAMOL 500 MG PO TABS: 500.0000 mg | ORAL_TABLET | Freq: Every evening | ORAL | 0 refills | Status: DC | PRN

## 2017-06-11 MED ORDER — IBUPROFEN 800 MG PO TABS
800.0000 mg | ORAL_TABLET | Freq: Once | ORAL | Status: AC
  Administered 2017-06-11: 800 mg via ORAL
  Filled 2017-06-11: qty 1
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
## 2017-07-17 ENCOUNTER — Other Ambulatory Visit: Payer: Self-pay

## 2017-07-17 ENCOUNTER — Inpatient Hospital Stay (HOSPITAL_COMMUNITY)
Admission: AD | Admit: 2017-07-17 | Discharge: 2017-07-17 | Disposition: A | Discharge status: Home / Self Care | Payer: Self-pay | Source: Ambulatory Visit | Attending: Family Medicine | Admitting: Family Medicine

## 2017-07-17 ENCOUNTER — Encounter (HOSPITAL_COMMUNITY): Payer: Self-pay

## 2017-07-17 DIAGNOSIS — Z880 Allergy status to penicillin: Secondary | ICD-10-CM | POA: Insufficient documentation

## 2017-07-17 DIAGNOSIS — Z113 Encounter for screening for infections with a predominantly sexual mode of transmission: Secondary | ICD-10-CM | POA: Insufficient documentation

## 2017-07-17 DIAGNOSIS — N760 Acute vaginitis: Secondary | ICD-10-CM | POA: Insufficient documentation

## 2017-07-17 DIAGNOSIS — F419 Anxiety disorder, unspecified: Secondary | ICD-10-CM | POA: Insufficient documentation

## 2017-07-17 DIAGNOSIS — M545 Low back pain: Secondary | ICD-10-CM | POA: Insufficient documentation

## 2017-07-17 DIAGNOSIS — N898 Other specified noninflammatory disorders of vagina: Secondary | ICD-10-CM | POA: Insufficient documentation

## 2017-07-17 DIAGNOSIS — F17210 Nicotine dependence, cigarettes, uncomplicated: Secondary | ICD-10-CM | POA: Insufficient documentation

## 2017-07-17 DIAGNOSIS — B9789 Other viral agents as the cause of diseases classified elsewhere: Secondary | ICD-10-CM | POA: Insufficient documentation

## 2017-07-17 DIAGNOSIS — B9689 Other specified bacterial agents as the cause of diseases classified elsewhere: Secondary | ICD-10-CM | POA: Insufficient documentation

## 2017-07-17 DIAGNOSIS — J069 Acute upper respiratory infection, unspecified: Secondary | ICD-10-CM | POA: Insufficient documentation

## 2017-07-17 DIAGNOSIS — Z3202 Encounter for pregnancy test, result negative: Secondary | ICD-10-CM | POA: Insufficient documentation

## 2017-07-17 DIAGNOSIS — F319 Bipolar disorder, unspecified: Secondary | ICD-10-CM | POA: Insufficient documentation

## 2017-07-17 LAB — COMPREHENSIVE METABOLIC PANEL
ALT: 12 U/L — ABNORMAL LOW (ref 14–54)
AST: 19 U/L (ref 15–41)
Albumin: 4.2 g/dL (ref 3.5–5.0)
Alkaline Phosphatase: 61 U/L (ref 38–126)
Anion gap: 7 (ref 5–15)
BUN: 7 mg/dL (ref 6–20)
CALCIUM: 9 mg/dL (ref 8.9–10.3)
CHLORIDE: 107 mmol/L (ref 101–111)
CO2: 22 mmol/L (ref 22–32)
Creatinine, Ser: 0.69 mg/dL (ref 0.44–1.00)
GFR calc non Af Amer: >60 mL/min (ref >60)
Glucose, Bld: 85 mg/dL (ref 65–99)
Potassium: 3.7 mmol/L (ref 3.5–5.1)
SODIUM: 136 mmol/L (ref 135–145)
Total Bilirubin: 0.8 mg/dL (ref 0.3–1.2)
Total Protein: 7.6 g/dL (ref 6.5–8.1)

## 2017-07-17 LAB — URINALYSIS, ROUTINE W REFLEX MICROSCOPIC
BILIRUBIN URINE: NEGATIVE
Bacteria, UA: NONE SEEN
GLUCOSE, UA: NEGATIVE mg/dL
KETONES UR: NEGATIVE mg/dL
NITRITE: NEGATIVE
PROTEIN: NEGATIVE mg/dL
Specific Gravity, Urine: 1.002 — ABNORMAL LOW (ref 1.005–1.030)
Squamous Epithelial / LPF: NONE SEEN
pH: 6 (ref 5.0–8.0)

## 2017-07-17 LAB — WET PREP, GENITAL
SPERM: NONE SEEN
TRICH WET PREP: NONE SEEN
Yeast Wet Prep HPF POC: NONE SEEN

## 2017-07-17 LAB — CBC
HEMATOCRIT: 38.3 % (ref 36.0–46.0)
HEMOGLOBIN: 13.4 g/dL (ref 12.0–15.0)
MCH: 31.6 pg (ref 26.0–34.0)
MCHC: 35 g/dL (ref 30.0–36.0)
MCV: 90.3 fL (ref 78.0–100.0)
Platelets: 248 10*3/uL (ref 150–400)
RBC: 4.24 MIL/uL (ref 3.87–5.11)
RDW: 12.8 % (ref 11.5–15.5)
WBC: 5.5 10*3/uL (ref 4.0–10.5)

## 2017-07-17 LAB — POCT PREGNANCY, URINE: Preg Test, Ur: NEGATIVE

## 2017-07-17 MED ORDER — METRONIDAZOLE 500 MG PO TABS: 500.0000 mg | ORAL_TABLET | Freq: Two times a day (BID) | ORAL | 0 refills | Status: DC

## 2017-07-17 NOTE — MAU Provider Note (Signed)
History     CSN: [id number]  Arrival date and time: 07/17/17 1702   First Provider Initiated Contact with Patient 07/17/17 1738      Chief Complaint  Patient presents with  . STD screen  . URI  . Vaginal Discharge   HPI  Elizabeth [name] is a 27 y.o. G1P0010 non pregnant female who presents with multiple concerns.  She reports cold symptoms since Saturday after attending her mom's birthday party. Reports nasal congestion, runny nose, and sneezing. Denies fever/chills, ear pain, cough, SOB, or sore throat. Has been taking claritin with moderate relief of symptoms but has run out and can't afford more until she gets paid.   States she thinks she has either a UTI or kidney infection. Symptoms of foul smelling urine and low back pain. Thinks she had a UTI last year that she never treated and has now moved to her kidneys. Denies dysuria, hematuria, urinary frequency, flank pain. Reports lower aching back pain that she noticed last week. Denies pain at this time.   Has noticed thin vaginal discharge since last week. No odor or irritation. States she would like to be tested for all STDs. She denies any history of STDs. She is in monogamous relationship x 3 years. Does not use contraception or condoms. Denies dyspareunia or post coital bleeding and does not think that her SO has been unfaithful.   Wants to be tested for endometriosis. She thinks that she has endometriosis due to painful menses, occasional irregular menses, and thinks this is why she has not become pregnant. Normally has period every month except for last year when she went 3 months without a cycle. She has not used contraception since 2014; was on OCPs. States the first day of her cycle is extremely painful and she is unable to work on these days. Typical menses lasts 5 days. States she has never been evaluated for her cycle issues.  Currently on cycle. Reports appropriate amount of bleeding for her. Denies abdominal pain.     Past Medical History:  Diagnosis Date  . Anxiety   . Bipolar 1 disorder [facility](HCC)     Past Surgical History:  Procedure Laterality Date  . ABSCESS DRAINAGE     left groin  . HEEL SPUR SURGERY    . INCISION AND DRAINAGE ABSCESS Left 03/03/2016   Procedure: INCISION AND DRAINAGE ABSCESS;  Surgeon: Leafy Roiego F Pabon, MD;  Location: [facility] ORS;  Service: General;  Laterality: Left;  . right foot surgery    . TONSILLECTOMY AND ADENOIDECTOMY      Family History  Problem Relation Age of Onset  . Alcohol abuse Father   . Alcohol abuse Maternal Grandfather   . Alcohol abuse Paternal Grandfather     Social History   Tobacco Use  . Smoking status: Current Every Day Smoker    Packs/day: 0.50    Types: Cigarettes  . Smokeless tobacco: Never Used  Substance Use Topics  . Alcohol use: Yes    Alcohol/week: 0.6 oz    Types: 1 Glasses of wine per week    Comment: occasional  . Drug use: Yes    Types: Marijuana    Comment: history of drug abuse/cocaine and narcotics. Used marijuana 2 days ago. Denies cocaine use.    Allergies:  Allergies  Allergen Reactions  . Depakote [Divalproex Sodium] Anaphylaxis  . Penicillins Anaphylaxis and Other (See Comments)    Has patient had a PCN reaction causing immediate rash, facial/tongue/throat swelling, SOB or lightheadedness with  hypotension: Yes Has patient had a PCN reaction causing severe rash involving mucus membranes or skin necrosis: No Has patient had a PCN reaction that required hospitalization No Has patient had a PCN reaction occurring within the last 10 years: No If all of the above answers are "NO", then may proceed with Cephalosporin use.  [name] Risperidone And Related     unknown  . Trazodone And Nefazodone Other (See Comments)    Pt states that she does not like how this medication makes her feel.   . Vistaril [Hydroxyzine Hcl] Itching  . Peanut Butter Flavor Itching and Rash    Reaction to peanut butter only, tolerates peanuts.     Medications Prior to Admission  Medication Sig Dispense Refill Last Dose  . methocarbamol (ROBAXIN) 500 MG tablet Take 1 tablet (500 mg total) by mouth at bedtime as needed for muscle spasms. 20 tablet 0   . Multiple Vitamin (MULTIVITAMIN WITH MINERALS) TABS tablet Take 1 tablet by mouth daily. 7 tablet 0 06/10/2017 at Unknown time  . naproxen (NAPROSYN) 500 MG tablet Take 1 tablet (500 mg total) by mouth 2 (two) times daily with a meal. 30 tablet 0     Review of Systems  Constitutional: Negative.   HENT: Positive for congestion, rhinorrhea and sneezing. Negative for ear pain and sore throat.   Gastrointestinal: Negative.  Negative for abdominal pain, nausea and vomiting.  Genitourinary: Positive for menstrual problem and vaginal discharge. Negative for dyspareunia, dysuria, flank pain, frequency, hematuria and vaginal bleeding.  Musculoskeletal: Positive for back pain.   Physical Exam   Blood pressure 109/73, pulse 75, temperature 98.8 F (37.1 C), temperature source Oral, resp. rate 16, height 5\' 6"  (1.676 m), weight 183 lb 12 oz (83.3 kg), SpO2 98 %.  Physical Exam  Nursing note and vitals reviewed. Constitutional: She is oriented to person, place, and time. She appears well-developed and well-nourished. No distress.  HENT:  Head: Normocephalic and atraumatic.  Nose: Mucosal edema and rhinorrhea present. Right sinus exhibits no maxillary sinus tenderness and no frontal sinus tenderness. Left sinus exhibits no maxillary sinus tenderness and no frontal sinus tenderness.  Mouth/Throat: Oropharynx is clear and moist.  Unable to visualize TMs d/t cerumen  Eyes: Conjunctivae are normal. Right eye exhibits no discharge. Left eye exhibits no discharge. No scleral icterus.  Neck: Normal range of motion.  Cardiovascular: Normal rate, regular rhythm and normal heart sounds.  No murmur heard. Respiratory: Effort normal and breath sounds normal. No respiratory distress. She has no wheezes.   GI: Soft. Bowel sounds are normal. She exhibits no distension. There is no tenderness. There is no rebound and no CVA tenderness.  Neurological: She is alert and oriented to person, place, and time.  Skin: Skin is warm and dry. She is not diaphoretic.  Psychiatric: She has a normal mood and affect. Her behavior is normal. Judgment and thought content normal.    MAU Course  Procedures Results for orders placed or performed during the hospital encounter of 07/17/17 (from the past 24 hour(s))  Urinalysis, Routine w reflex microscopic     Status: Abnormal   Collection Time: 07/17/17  2:45 PM  Result Value Ref Range   Color, Urine COLORLESS (A) YELLOW   APPearance CLEAR CLEAR   Specific Gravity, Urine 1.002 (L) 1.005 - 1.030   pH 6.0 5.0 - 8.0   Glucose, UA NEGATIVE NEGATIVE mg/dL   Hgb urine dipstick MODERATE (A) NEGATIVE   Bilirubin Urine NEGATIVE NEGATIVE   Ketones, ur  NEGATIVE NEGATIVE mg/dL   Protein, ur NEGATIVE NEGATIVE mg/dL   Nitrite NEGATIVE NEGATIVE   Leukocytes, UA TRACE (A) NEGATIVE   RBC / HPF 0-5 0 - 5 RBC/hpf   WBC, UA 0-5 0 - 5 WBC/hpf   Bacteria, UA NONE SEEN NONE SEEN   Squamous Epithelial / LPF NONE SEEN NONE SEEN  Pregnancy, urine POC     Status: None   Collection Time: 07/17/17  5:50 PM  Result Value Ref Range   Preg Test, Ur NEGATIVE NEGATIVE  CBC     Status: None   Collection Time: 07/17/17  6:43 PM  Result Value Ref Range   WBC 5.5 4.0 - 10.5 K/uL   RBC 4.24 3.87 - 5.11 MIL/uL   Hemoglobin 13.4 12.0 - 15.0 g/dL   HCT 16.1 09.6 - 04.5 %   MCV 90.3 78.0 - 100.0 fL   MCH 31.6 26.0 - 34.0 pg   MCHC 35.0 30.0 - 36.0 g/dL   RDW 40.9 81.1 - 91.4 %   Platelets 248 150 - 400 K/uL  Comprehensive metabolic panel     Status: Abnormal   Collection Time: 07/17/17  6:43 PM  Result Value Ref Range   Sodium 136 135 - 145 mmol/L   Potassium 3.7 3.5 - 5.1 mmol/L   Chloride 107 101 - 111 mmol/L   CO2 22 22 - 32 mmol/L   Glucose, Bld 85 65 - 99 mg/dL   BUN 7 6 - 20  mg/dL   Creatinine, Ser 7.82 0.44 - 1.00 mg/dL   Calcium 9.0 8.9 - 95.6 mg/dL   Total Protein 7.6 6.5 - 8.1 g/dL   Albumin 4.2 3.5 - 5.0 g/dL   AST 19 15 - 41 U/L   ALT 12 (L) 14 - 54 U/L   Alkaline Phosphatase 61 38 - 126 U/L   Total Bilirubin 0.8 0.3 - 1.2 mg/dL   GFR calc non Af Amer >60 >60 mL/min   GFR calc Af Amer >60 >60 mL/min   Anion gap 7 5 - 15  Wet prep, genital     Status: Abnormal   Collection Time: 07/17/17  7:00 PM  Result Value Ref Range   Yeast Wet Prep HPF POC NONE SEEN NONE SEEN   Trich, Wet Prep NONE SEEN NONE SEEN   Clue Cells Wet Prep HPF POC PRESENT (A) NONE SEEN   WBC, Wet Prep HPF POC FEW (A) NONE SEEN   Sperm NONE SEEN     MDM UPT negative Patient with multiple complaints; mostly should be managed outpatient. Discussed u/a results with her. Hemoglobin likely d/t menstrual bleeding; but will send for urine culture. VSS & no CVAT. CMP ordered and kidney function tests normal. Pt relieved.  STD screening ordered per patient request.  Patient with benign exam.  Has not seen a PCP in over 2 years d/t insurance issues.   Assessment and Plan  A: 1. Screen for STD (sexually transmitted disease)   2. Viral upper respiratory tract infection   3. Pregnancy examination or test, negative result   4. BV (bacterial vaginosis)    P: Discharge home Rx flagyl -- no alcohol while taking HIV, RPR, GC/CT pending Urine culture pending Recommend getting cetrizine at [facility] outpatient pharmacy  Msg to [facility] for f/u appt Patient given [facility] resources information for self pay & primary care  Judeth Horn 07/17/2017, 5:38 PM

## 2017-07-17 NOTE — MAU Note (Addendum)
Pt presents with multiple complaints:  She states she thinks she has uti that went untreated for so long that it has developed into a kidney infection.   She states she thinks she has endometriosis, a cold, and BV.  She also states that's she is having difficulty getting pregnant.   She would like to have STD/HIV testing and a breast exam.

## 2017-07-17 NOTE — Discharge Instructions (Signed)
[place] (Revised August 2014)    Insufficient Money for Medicine:           [address]: call "211"    MAP Program at [place] or [place] [phone]            No Primary Care Doctor:  To locate a primary care doctor that accepts your insurance or provides certain services:           [place]: [phone]           Physician Referral Service: [phone] ask for My [place]  If no insurance, you need to see if you qualify for [facility] orange card, call to set      up appointment for eligibility/enrollment at [phone] or [phone] or visit [place] ([address], [place] and [place]) to meet with a [facility] enrollment specialist.  Agencies that provide inexpensive (sliding fee scale) medical care:       [facility] - Family Medicine at [place] - [phone]     [facility] [place] - [facility] - [place] [phone]     [facility] for Children - [place] [phone]  [facility] - [place] @ [place] [phone]  [facility] - [place] @ [place] - [phone]  [facility]: [phone]   Women's Clinic: [phone]   Planned Parenthood: [phone]   [facility] [place]    [place] Providers:           [facility] [phone] (No Family Planning accepted)          [address], [phone], Mon-Fri 9am-5pm          [facility] - [phone]  [place], [address] [place], Mon-Thursday 8am-5pm, Fri 8am-noon  [employer] [place] -  [place], [address], Mon-Fri 7:30am-4:30pm          [place] - [phone]          [address], [facility] - [phone] [address], [address]          Only accepts [place] [employer] patients after they have their name applied to their card  Self Pay (no insurance) in [facility]:           Sickle Cell Patients:   [place], [phone] [facility]:  [address] [place] [phone]       [facility]  [address] [place] [phone]  [place]:  [address] [phone]          [facility]           [place], [phone] [facility] for [place], [phone]           [facility] [place]           [place], [address], [place] [address]           [phone], Mon-Fri 9am-7pm, [place] 9am-1pm          [facility] - Family Medicine @ [place], [place]          [facility] - [facility]           [address] [place] [facility] and [place]  [address] [place] [phone]          [place], [place]  [facility] - [place]   [place], [place] [phone] [facility] and [place]  [address] [phone]  Dr. Vista Lawman           [address], [place], [facility]           [address] [phone]          [facility]             [address]  [phone]          [facility]           [place], [place], 1st & 3rd Saturday every month, 10am-1pm OTHERS:  [facility]  ([facility] Only)  [phone] (Thursday only) Strategies for finding a Primary Care Provider:  1) Find a Doctor and Pay Out of Pocket  Although you won't have to find out who is covered by your insurance plan, it is a good idea to ask around and get recommendations. You will then need to call the office and see if the doctor you have chosen will accept you as a new patient and what types of options they offer for patients who are self-pay. Some doctors offer discounts or will set up payment plans for their patients who do not have insurance, but you will need to ask so you aren't surprised when you get to your appointment.  2) [place] - To see if you qualify for orange card access to healthcare safety net providers.  Call for appointment for eligibility/enrollment at [phone] or [phone]. (Uninsured, 0-200% FPL, qualifying info)  Applicants for [facility] are first required to see if they are eligible to enroll in the [facility] Marketplace before enrolling in [facility] (and get an exemption if they are not).  GCCN Criteria for acceptance is:  ? Proof of ACA Marketing exemption - form or documentation  ? Valid photo ID (driver's license, state identification card, passport, home country ID)  ? Proof of [facility] residency (e.g. drivers license, lease/landlord information, pay stubs with address, utility bill, bank statement, etc.)  ? Proof of income (1040, last year's tax return, W2, 4 current pay stubs, other income proof)  ? Proof of assets (current bank statement +  3 most recent, disability paperwork, life insurance info, tax value on autos, etc.)  3) [place] Department  Not all health departments have doctors that can see patients for sick visits, but many do, so it is worth a call  to see if yours does. If you don't know where your local health department is, you can check in your phone book. The CDC also has a tool to help you locate your state's health department, and many state websites also have listings of all of their local health departments.  4) Find a [facility]  If your illness is not likely to be very severe or complicated, you may want to try a walk in clinic. These are popping up all over the country in pharmacies, drugstores, and shopping centers. They're usually staffed by nurse practitioners or physician assistants that have been trained to treat common illnesses and complaints. They're usually fairly quick and inexpensive. However, if you have serious medical issues or chronic medical problems, these are probably not your best option   STD Testing:           [place], [place] [facility]           [address] [place], phone [phone] or [phone]           Monday - Friday, call for an appointment          [place], [place] [facility]           [address], [place], phone [phone] or [phone]           Monday - Friday, call for an appointment Abuse/Neglect:           [place]: [place]: [phone] (After Hours) Emergency Shelter:  [facility] [phone]  Ages- [phone]  [place] - [phone]  Youth Focus - Act Together - [phone] (ages 32-17)  [place] @ [employer] - [phone]   Mammograms - Free at [facility] [place]:           Room at the [place]: [phone]   (Homeless mother with children)          [place]: [phone] (Mothers only)  Youth Focus: [phone] (Pregnant 3-21 years old)  Adopt a Mom  [phone]  [place]   [facility] [place]  [address] [place] [phone]          [facility] [place]           [address], [place], [place]          [facility].           [place], [place]  [place] [facility]           [phone]          [facility] in [place]           [phone]           [phone] (After Hours)  [place] Abuse Resources:           Alcohol and Drug Services: [phone]           [facility]: [phone]          [facility]: [phone]   Narcotics Helpline - [phone]          [facility]: [phone]           Residential & Outpatient Substance Abuse Program - [place]: [phone]  [employer]  [place] and [place] - [phone] Psychological Services:          [place]: [phone]   Therapeutic Alternatives: [phone]          [place]           [address]: [phone]     (24 Hour)  Mobile Crisis:   [place]:  Radio producer on [place] [phone] [facility] on [place] [phone]  Walk In [place]  ([place] - [phone] or [phone]  [place]. [place] [phone]  [place] [address] [place] [phone]   Dental Assistance:  If unable to pay or uninsured, contact: [facility] to become qualified for the adult dental clinic. Patient must be enrolled in [facility] (uninsured, 0-200% FPL, qualifying info).  Enroll in [facility] first, then see Primary Care Physician assigned to you, the PCP makes a dental referral. [facility] will receive referral and contacts patient for appointment.  Patients with Medicaid           [address], [place] (Children up to 80 + Pregnant Women) - [phone]  [place] - Suite [phone]  If unable to pay, or uninsured: contact [place] [phone] in [place] - ([place] only + Pregnant Women), [phone] in [place] only) to become qualified for the adult dental clinic  Must see if eligible to enroll in [place] before enrolling into the [facility] (exemption required) [phone] for an appointment)  [email or web address];   [phone].  If not eligible for ACA, then go by Department of Health and Human Services to see if eligible for orange card.  [address], [place].  Once you get an orange card, you will have a Primary Care home who will then refer you to dental if needed.  Other Scientist, forensic:   [facility] - [phone] (ext [phone])   [address]  Dr. Donn Pierini - [phone]   [place]   [facility]           [place], [place], [place], [address]           [phone], Ext. [phone]           2nd and 4th Thursday of the month at 6:30am (Simple extractions only - no wisdom teeth or surgery) First come/First serve -First 10 clients served           [facility] [place], [place] and [place] residents only)          [address] Madelaine Bhat [place], [place], [address]           [phone]                    [place] Department           [phone]          [place]         [facility]          [phone]   Transportation Options:  Ambulance - 911 - [phone] per ride Family Member to accompany patient (if stable) - [place] - [phone]  PART - [phone]  Taxi - [phone] - [place] [phone] (Application required)  [facility] - [phone]    Health Maintenance, Female Adopting a healthy lifestyle and getting preventive care can go a long way to promote health and wellness. Talk with your health care provider about what schedule of regular examinations is right for you. This is a good chance for you to check in with your provider about disease prevention and staying healthy. In between checkups, there are plenty of things you can do on your own. Experts have done a lot of research about which lifestyle changes and preventive measures are most likely to keep you healthy. Ask your health care provider for more information. Weight and diet Eat a healthy diet  Be sure to include plenty of vegetables, fruits, low-fat dairy products, and lean protein.  Do not eat a lot of foods high in solid fats, added sugars, or salt.  Get regular exercise. This is one of the most important things you can do for your health. ? Most adults should exercise for at least 150 minutes each week. The exercise should increase your heart rate and make you sweat (moderate-intensity exercise). ? Most adults should also do strengthening exercises at least twice a week. This is in addition to the moderate-intensity exercise.  Maintain a healthy weight  Body mass index (BMI) is a measurement that can be used to identify  possible weight problems. It estimates body fat based on height and weight. Your health care provider can help determine your BMI and help you achieve or maintain a healthy weight.  For females 106 years of age and older: ? A BMI below 18.5 is considered underweight. ? A BMI of 18.5 to 24.9 is normal. ? A BMI of 25 to 29.9 is considered overweight. ? A BMI of 30 and  above is considered obese.  Watch levels of cholesterol and blood lipids  You should start having your blood tested for lipids and cholesterol at 29 years of age, then have this test every 5 years.  You may need to have your cholesterol levels checked more often if: ? Your lipid or cholesterol levels are high. ? You are older than 29 years of age. ? You are at high risk for heart disease.  Cancer screening Lung Cancer  Lung cancer screening is recommended for adults 48-46 years old who are at high risk for lung cancer because of a history of smoking.  A yearly low-dose CT scan of the lungs is recommended for people who: ? Currently smoke. ? Have quit within the past 15 years. ? Have at least a 30-pack-year history of smoking. A pack year is smoking an average of one pack of cigarettes a day for 1 year.  Yearly screening should continue until it has been 15 years since you quit.  Yearly screening should stop if you develop a health problem that would prevent you from having lung cancer treatment.  Breast Cancer  Practice breast self-awareness. This means understanding how your breasts normally appear and feel.  It also means doing regular breast self-exams. Let your health care provider know about any changes, no matter how small.  If you are in your 20s or 30s, you should have a clinical breast exam (CBE) by a health care provider every 1-3 years as part of a regular health exam.  If you are 51 or older, have a CBE every year. Also consider having a breast X-ray (mammogram) every year.  If you have a family history  of breast cancer, talk to your health care provider about genetic screening.  If you are at high risk for breast cancer, talk to your health care provider about having an MRI and a mammogram every year.  Breast cancer gene (BRCA) assessment is recommended for women who have family members with BRCA-related cancers. BRCA-related cancers include: ? Breast. ? Ovarian. ? Tubal. ? Peritoneal cancers.  Results of the assessment will determine the need for genetic counseling and BRCA1 and BRCA2 testing.  Cervical Cancer Your health care provider may recommend that you be screened regularly for cancer of the pelvic organs (ovaries, uterus, and vagina). This screening involves a pelvic examination, including checking for microscopic changes to the surface of your cervix (Pap test). You may be encouraged to have this screening done every 3 years, beginning at age 49.  For women ages 36-65, health care providers may recommend pelvic exams and Pap testing every 3 years, or they may recommend the Pap and pelvic exam, combined with testing for human papilloma virus (HPV), every 5 years. Some types of HPV increase your risk of cervical cancer. Testing for HPV may also be done on women of any age with unclear Pap test results.  Other health care providers may not recommend any screening for nonpregnant women who are considered low risk for pelvic cancer and who do not have symptoms. Ask your health care provider if a screening pelvic exam is right for you.  If you have had past treatment for cervical cancer or a condition that could lead to cancer, you need Pap tests and screening for cancer for at least 20 years after your treatment. If Pap tests have been discontinued, your risk factors (such as having a new sexual partner) need to be reassessed to determine if screening should resume. Some women have  medical problems that increase the chance of getting cervical cancer. In these cases, your health care provider  may recommend more frequent screening and Pap tests.  Colorectal Cancer  This type of cancer can be detected and often prevented.  Routine colorectal cancer screening usually begins at 29 years of age and continues through 29 years of age.  Your health care provider may recommend screening at an earlier age if you have risk factors for colon cancer.  Your health care provider may also recommend using home test kits to check for hidden blood in the stool.  A small camera at the end of a tube can be used to examine your colon directly (sigmoidoscopy or colonoscopy). This is done to check for the earliest forms of colorectal cancer.  Routine screening usually begins at age 61.  Direct examination of the colon should be repeated every 5-10 years through 29 years of age. However, you may need to be screened more often if early forms of precancerous polyps or small growths are found.  Skin Cancer  Check your skin from head to toe regularly.  Tell your health care provider about any new moles or changes in moles, especially if there is a change in a mole's shape or color.  Also tell your health care provider if you have a mole that is larger than the size of a pencil eraser.  Always use sunscreen. Apply sunscreen liberally and repeatedly throughout the day.  Protect yourself by wearing long sleeves, pants, a wide-brimmed hat, and sunglasses whenever you are outside.  Heart disease, diabetes, and high blood pressure  High blood pressure causes heart disease and increases the risk of stroke. High blood pressure is more likely to develop in: ? People who have blood pressure in the high end of the normal range (130-139/85-89 mm Hg). ? People who are overweight or obese. ? People who are African American.  If you are 63-49 years of age, have your blood pressure checked every 3-5 years. If you are 13 years of age or older, have your blood pressure checked every year. You should have your  blood pressure measured twice--once when you are at a hospital or clinic, and once when you are not at a hospital or clinic. Record the average of the two measurements. To check your blood pressure when you are not at a hospital or clinic, you can use: ? An automated blood pressure machine at a pharmacy. ? A home blood pressure monitor.  If you are between 39 years and 30 years old, ask your health care provider if you should take aspirin to prevent strokes.  Have regular diabetes screenings. This involves taking a blood sample to check your fasting blood sugar level. ? If you are at a normal weight and have a low risk for diabetes, have this test once every three years after 29 years of age. ? If you are overweight and have a high risk for diabetes, consider being tested at a younger age or more often. Preventing infection Hepatitis B  If you have a higher risk for hepatitis B, you should be screened for this virus. You are considered at high risk for hepatitis B if: ? You were born in a country where hepatitis B is common. Ask your health care provider which countries are considered high risk. ? Your parents were born in a high-risk country, and you have not been immunized against hepatitis B (hepatitis B vaccine). ? You have HIV or AIDS. ? You use  needles to inject street drugs. ? You live with someone who has hepatitis B. ? You have had sex with someone who has hepatitis B. ? You get hemodialysis treatment. ? You take certain medicines for conditions, including cancer, organ transplantation, and autoimmune conditions.  Hepatitis C  Blood testing is recommended for: ? Everyone born from [birth year] through 1967. ? Anyone with known risk factors for hepatitis C.  Sexually transmitted infections (STIs)  You should be screened for sexually transmitted infections (STIs) including gonorrhea and chlamydia if: ? You are sexually active and are younger than 29 years of age. ? You are older than  29 years of age and your health care provider tells you that you are at risk for this type of infection. ? Your sexual activity has changed since you were last screened and you are at an increased risk for chlamydia or gonorrhea. Ask your health care provider if you are at risk.  If you do not have HIV, but are at risk, it may be recommended that you take a prescription medicine daily to prevent HIV infection. This is called pre-exposure prophylaxis (PrEP). You are considered at risk if: ? You are sexually active and do not regularly use condoms or know the HIV status of your partner(s). ? You take drugs by injection. ? You are sexually active with a partner who has HIV.  Talk with your health care provider about whether you are at high risk of being infected with HIV. If you choose to begin PrEP, you should first be tested for HIV. You should then be tested every 3 months for as long as you are taking PrEP. Pregnancy  If you are premenopausal and you may become pregnant, ask your health care provider about preconception counseling.  If you may become pregnant, take 400 to 800 micrograms (mcg) of folic acid every day.  If you want to prevent pregnancy, talk to your health care provider about birth control (contraception). Osteoporosis and menopause  Osteoporosis is a disease in which the bones lose minerals and strength with aging. This can result in serious bone fractures. Your risk for osteoporosis can be identified using a bone density scan.  If you are 20 years of age or older, or if you are at risk for osteoporosis and fractures, ask your health care provider if you should be screened.  Ask your health care provider whether you should take a calcium or vitamin D supplement to lower your risk for osteoporosis.  Menopause may have certain physical symptoms and risks.  Hormone replacement therapy may reduce some of these symptoms and risks. Talk to your health care provider about whether  hormone replacement therapy is right for you. Follow these instructions at home:  Schedule regular health, dental, and eye exams.  Stay current with your immunizations.  Do not use any tobacco products including cigarettes, chewing tobacco, or electronic cigarettes.  If you are pregnant, do not drink alcohol.  If you are breastfeeding, limit how much and how often you drink alcohol.  Limit alcohol intake to no more than 1 drink per day for nonpregnant women. One drink equals 12 ounces of beer, 5 ounces of wine, or 1 ounces of hard liquor.  Do not use street drugs.  Do not share needles.  Ask your health care provider for help if you need support or information about quitting drugs.  Tell your health care provider if you often feel depressed.  Tell your health care provider if you have ever been  abused or do not feel safe at home. This information is not intended to replace advice given to you by your health care provider. Make sure you discuss any questions you have with your health care provider. Document Released: 12/05/2010 Document Revised: 10/28/2015 Document Reviewed: 02/23/2015 Elsevier Interactive Patient Education  2018 [employer].      Contraception Choices Contraception, also called birth control, refers to methods or devices that prevent pregnancy. Hormonal methods Contraceptive implant A contraceptive implant is a thin, plastic tube that contains a hormone. It is inserted into the upper part of the arm. It can remain in place for up to 3 years. Progestin-only injections Progestin-only injections are injections of progestin, a synthetic form of the hormone progesterone. They are given every 3 months by a health care provider. Birth control pills Birth control pills are pills that contain hormones that prevent pregnancy. They must be taken once a day, preferably at the same time each day. Birth control patch The birth control patch contains hormones that  prevent pregnancy. It is placed on the skin and must be changed once a week for three weeks and removed on the fourth week. A prescription is needed to use this method of contraception. Vaginal ring A vaginal ring contains hormones that prevent pregnancy. It is placed in the vagina for three weeks and removed on the fourth week. After that, the process is repeated with a new ring. A prescription is needed to use this method of contraception. Emergency contraceptive Emergency contraceptives prevent pregnancy after unprotected sex. They come in pill form and can be taken up to 5 days after sex. They work best the sooner they are taken after having sex. Most emergency contraceptives are available without a prescription. This method should not be used as your only form of birth control. Barrier methods Female condom A female condom is a thin sheath that is worn over the penis during sex. Condoms keep sperm from going inside a woman's body. They can be used with a spermicide to increase their effectiveness. They should be disposed after a single use. Female condom A female condom is a soft, loose-fitting sheath that is put into the vagina before sex. The condom keeps sperm from going inside a woman's body. They should be disposed after a single use. Diaphragm A diaphragm is a soft, dome-shaped barrier. It is inserted into the vagina before sex, along with a spermicide. The diaphragm blocks sperm from entering the uterus, and the spermicide kills sperm. A diaphragm should be left in the vagina for 6-8 hours after sex and removed within 24 hours. A diaphragm is prescribed and fitted by a health care provider. A diaphragm should be replaced every 1-2 years, after giving birth, after gaining more than 15 lb (6.8 kg), and after pelvic surgery. Cervical cap A cervical cap is a round, soft latex or plastic cup that fits over the cervix. It is inserted into the vagina before sex, along with spermicide. It blocks sperm  from entering the uterus. The cap should be left in place for 6-8 hours after sex and removed within 48 hours. A cervical cap must be prescribed and fitted by a health care provider. It should be replaced every 2 years. Sponge A sponge is a soft, circular piece of polyurethane foam with spermicide on it. The sponge helps block sperm from entering the uterus, and the spermicide kills sperm. To use it, you make it wet and then insert it into the vagina. It should be inserted before  sex, left in for at least 6 hours after sex, and removed and thrown away within 30 hours. Spermicides Spermicides are chemicals that kill or block sperm from entering the cervix and uterus. They can come as a cream, jelly, suppository, foam, or tablet. A spermicide should be inserted into the vagina with an applicator at least 01-49 minutes before sex to allow time for it to work. The process must be repeated every time you have sex. Spermicides do not require a prescription. Intrauterine contraception Intrauterine device (IUD) An IUD is a T-shaped device that is put in a woman's uterus. There are two types:  Hormone IUD.This type contains progestin, a synthetic form of the hormone progesterone. This type can stay in place for 3-5 years.  Copper IUD.This type is wrapped in copper wire. It can stay in place for 10 years.  Permanent methods of contraception Female tubal ligation In this method, a woman's fallopian tubes are sealed, tied, or blocked during surgery to prevent eggs from traveling to the uterus. Hysteroscopic sterilization In this method, a small, flexible insert is placed into each fallopian tube. The inserts cause scar tissue to form in the fallopian tubes and block them, so sperm cannot reach an egg. The procedure takes about 3 months to be effective. Another form of birth control must be used during those 3 months. Female sterilization This is a procedure to tie off the tubes that carry sperm (vasectomy).  After the procedure, the man can still ejaculate fluid (semen). Natural planning methods Natural family planning In this method, a couple does not have sex on days when the woman could become pregnant. Calendar method This means keeping track of the length of each menstrual cycle, identifying the days when pregnancy can happen, and not having sex on those days. Ovulation method In this method, a couple avoids sex during ovulation. Symptothermal method This method involves not having sex during ovulation. The woman typically checks for ovulation by watching changes in her temperature and in the consistency of cervical mucus. Post-ovulation method In this method, a couple waits to have sex until after ovulation. Summary  Contraception, also called birth control, means methods or devices that prevent pregnancy.  Hormonal methods of contraception include implants, injections, pills, patches, vaginal rings, and emergency contraceptives.  Barrier methods of contraception can include female condoms, female condoms, diaphragms, cervical caps, sponges, and spermicides.  There are two types of IUDs (intrauterine devices). An IUD can be put in a woman's uterus to prevent pregnancy for 3-5 years.  Permanent sterilization can be done through a procedure for males, females, or both.  Natural family planning methods involve not having sex on days when the woman could become pregnant. This information is not intended to replace advice given to you by your health care provider. Make sure you discuss any questions you have with your health care provider. Document Released: 05/22/2005 Document Revised: 06/24/2016 Document Reviewed: 06/24/2016 Elsevier Interactive Patient Education  2018 [employer].

## 2017-07-18 LAB — RPR: RPR Ser Ql: NONREACTIVE

## 2017-07-18 LAB — GC/CHLAMYDIA PROBE AMP (~~LOC~~) NOT AT ARMC
Chlamydia: NEGATIVE
Neisseria Gonorrhea: NEGATIVE

## 2017-07-18 LAB — URINE CULTURE: Culture: 20000 — AB

## 2017-07-19 LAB — HIV ANTIBODY (ROUTINE TESTING W REFLEX): HIV Screen 4th Generation wRfx: NONREACTIVE

## 2017-08-01 ENCOUNTER — Inpatient Hospital Stay
Admit: 2017-08-01 | Discharge: 2017-08-01 | Disposition: A | Discharge status: Home / Self Care | Payer: MEDICAID | Attending: Emergency Medicine

## 2017-08-01 DIAGNOSIS — H1013 Acute atopic conjunctivitis, bilateral: Secondary | ICD-10-CM

## 2017-08-01 MED ORDER — FLUORESCEIN 0.6 MG EYE STRIPS
0.6 mg | OPHTHALMIC | Status: AC
Start: 2017-08-01 — End: 2017-08-01
  Administered 2017-08-01: 16:00:00 via OPHTHALMIC

## 2017-08-01 MED ORDER — FLUORESCEIN 0.6 MG EYE STRIPS
0.6 mg | OPHTHALMIC | Status: DC
Start: 2017-08-01 — End: 2017-08-01

## 2017-08-01 MED ORDER — PROPARACAINE 0.5 % EYE DROPS
0.5 % | OPHTHALMIC | Status: AC
Start: 2017-08-01 — End: 2017-08-01
  Administered 2017-08-01: 16:00:00 via OPHTHALMIC

## 2017-08-01 MED ORDER — NAPHAZOLINE-PHENIRAMINE 0.025 %-0.3 % EYE DROPS
Freq: Three times a day (TID) | OPHTHALMIC | 0 refills | Status: AC | PRN
Start: 2017-08-01 — End: ?

## 2017-08-01 MED FILL — FUL-GLO 0.6 MG EYE STRIPS: 0.6 mg | OPHTHALMIC | Qty: 1

## 2017-08-01 MED FILL — PROPARACAINE 0.5 % EYE DROPS: 0.5 % | OPHTHALMIC | Qty: 15

## 2017-08-01 NOTE — ED Triage Notes (Signed)
Pt co scratchy eyes

## 2017-08-01 NOTE — ED Provider Notes (Addendum)
EMERGENCY DEPARTMENT HISTORY AND PHYSICAL EXAM    10:36 AM      Date: 08/01/2017  Patient Name: Janet Pugh    History of Presenting Illness     Chief Complaint   Patient presents with   ??? Eye Pain         History Provided By: Patient      Additional History (Context): Tyrianna MANUELLA BLACKSON is a 27 y.o. female with No significant past medical history who presents with gradually worsening eye irritation R>L onset x5 days PTA. Patient confirms eye redness,  right eye matted shut this morning. Patient denies loss of vision. She denies any other complaints and states she does not wear or is supposed to wear glasses or contacts. Has used visine with relief. No injury to eye. She confirms tobacco use at 4 cigarettes/day. LNMP 1/27. No other concerns or symptoms at this time.      PCP: None    Chief Complaint: eye irritation  Duration: 5 Days  Timing:  Gradual and Worsening  Location: bilateral, R>L  Quality: "scratchy"  Modifying Factors: visine  Associated Symptoms: right eye matted, conjuctival injection, "glare" over right eye        Past History     Past Medical History:  Past Medical History:   Diagnosis Date   ??? Eczema        Past Surgical History:  Past Surgical History:   Procedure Laterality Date   ??? HX ANKLE FRACTURE TX      with reconstruction   ??? HX CESAREAN SECTION  2015       Family History:  History reviewed. No pertinent family history.    Social History:  Social History     Tobacco Use   ??? Smoking status: Current Every Day Smoker     Packs/day: 0.25   ??? Smokeless tobacco: Never Used   Substance Use Topics   ??? Alcohol use: No   ??? Drug use: No       Allergies:  No Known Allergies      Review of Systems       Review of Systems   Constitutional: Negative for appetite change and fever.   HENT: Negative for congestion, rhinorrhea and sore throat.    Eyes: Positive for pain ("scratchy"), discharge (right eye matted), redness and visual disturbance ("glare" over right eye).    Respiratory: Negative for cough, shortness of breath and wheezing.    Cardiovascular: Negative for chest pain and leg swelling.   Gastrointestinal: Negative for abdominal pain, constipation, diarrhea, nausea and vomiting.   Genitourinary: Negative for dysuria.   Musculoskeletal: Negative for arthralgias and back pain.   Neurological: Negative for dizziness, syncope and headaches.   All other systems reviewed and are negative.        Physical Exam     Visit Vitals  BP 136/86 (BP 1 Location: Left arm, BP Patient Position: At rest)   Pulse 91   Temp 98.2 ??F (36.8 ??C)   Resp 16   Ht 5\' 4"  (1.626 m)   Wt 104.3 kg (230 lb)   SpO2 98%   BMI 39.48 kg/m??         Physical Exam   Constitutional: She is oriented to person, place, and time. She appears well-developed. No distress.   HENT:   Head: Atraumatic.   Right Ear: External ear normal.   Left Ear: External ear normal.   Nose: Nose normal.   Mouth/Throat: Oropharynx is clear and moist.  Eyes: EOM are normal. Pupils are equal, round, and reactive to light. Right eye exhibits chemosis. Right conjunctiva is not injected. Right conjunctiva has no hemorrhage. Left conjunctiva is not injected. Left conjunctiva has no hemorrhage.   Slit lamp exam:       The right eye shows no corneal abrasion, no corneal flare, no corneal ulcer and no foreign body.       Cardiovascular: Normal rate, regular rhythm and intact distal pulses.   Pulmonary/Chest: Effort normal and breath sounds normal. No respiratory distress. She has no wheezes. She has no rales.   Musculoskeletal: Normal range of motion.   Neurological: She is alert and oriented to person, place, and time. She exhibits normal muscle tone. Coordination normal.   Skin: Skin is warm and dry. No rash noted. She is not diaphoretic. No erythema. No pallor.   Nursing note and vitals reviewed.        Diagnostic Study Results     Labs -  No results found for this or any previous visit (from the past 12 hour(s)).    Radiologic Studies -    No orders to display         Medical Decision Making     It should be noted that I, Gerome Sam, PA will be the provider of record for this patient.    I reviewed the vital signs, available nursing notes, past medical history, past surgical history, family history and social history.    Vital Signs-Reviewed the patient's vital signs.    Pulse Oximetry Analysis -  98% on room air (Interpretation WNL)    Cardiac Monitor:  Rate: 91 BPM    Records Reviewed: Nursing Notes (Time of Review: 10:36 AM)      Provider Notes (Medical Decision Making):   Physical exam is consistent with allergic conjunctivitis, no injection, no purulent drainage, will treat with naphcon-A.  Referred to opthalmology for further evaluation.  Patient educated to return to the ED for any new or worsening symptoms.  Patient denies questions.        Diagnosis     Clinical Impression:   1. Allergic conjunctivitis, unspecified laterality      Disposition: Discharge    Follow-up Information     Follow up With Specialties Details Why Contact Info    [facility] [place].  Schedule an appointment as soon as possible for a visit in 1 week Further evaluation [address], [address]  [place] [address]  [phone]    [facility] Schedule an appointment as soon as possible for a visit in 1 week As needed [address]  [address]  [place] [address]  [phone]              Medication List      START taking these medications    naphazoline-pheniramine 0.025-0.3 % ophthalmic solution  Commonly known as:  NAPHCON-A  Administer 2 Drops to both eyes three (3) times daily as needed for Pain.           Where to Get Your Medications      Information about where to get these medications is not yet available    Ask your nurse or doctor about these medications  ?? naphazoline-pheniramine 0.025-0.3 % ophthalmic solution       _______________________________       Scribe Attestation      Llana Aliment acting as a Neurosurgeon for and in the presence of Maralyn Sago  Amylah Will, NP-C     August 01, 2017 at 10:36 AM       Provider Attestation:      I personally performed the services described in the documentation, reviewed the documentation, as recorded by the scribe in my presence, and it accurately and completely records my words and actions. August 01, 2017 at 10:36 AM - Gerome SamSarah Brytni Dray, NP-C      _______________________________

## 2017-08-01 NOTE — ED Notes (Signed)
I have reviewed discharge instructions with the patient.  The patient verbalized understanding.

## 2017-08-28 ENCOUNTER — Encounter: Payer: Self-pay | Admitting: Family Medicine

## 2017-08-28 ENCOUNTER — Encounter: Payer: Self-pay | Admitting: *Deleted

## 2017-08-28 NOTE — Progress Notes (Signed)
Elizabeth [name] did not keep her scheduled appointment . Per discussion with Dr.Degle does not need to be called, may reschedule if she calls.

## 2017-10-02 DIAGNOSIS — S60444A External constriction of right ring finger, initial encounter: Secondary | ICD-10-CM

## 2017-10-02 NOTE — ED Notes (Signed)
I have reviewed discharge instructions with the patient.  The patient verbalized understanding. Patient armband removed and shredded

## 2017-10-02 NOTE — ED Provider Notes (Signed)
EMERGENCY DEPARTMENT HISTORY AND PHYSICAL EXAM    Date: 10/02/2017  Patient Name: Janet Pugh    History of Presenting Illness     Chief Complaint   Patient presents with   ??? Finger Swelling     right ring finger         History Provided By: Patient    Chief Complaint: finger swelling  Duration: today   Timing:  Acute  Location: right ring finger  Quality: Aching  Severity: 5 out of 10  Modifying Factors:   Associated Symptoms: swelling      Additional History (Context): Janet Pugh is a 27 y.o. female with No significant past medical history who presents with right ring finger swelling x today. Pt states she slept in a ring which she normally does not wear. She could not get it off today and she tried lubricants, soap, dental floss and other methods she found online. There is no injury to the finger.    PCP: None    Current Outpatient Medications   Medication Sig Dispense Refill   ??? naphazoline-pheniramine (NAPHCON-A) 0.025-0.3 % ophthalmic solution Administer 2 Drops to both eyes three (3) times daily as needed for Pain. 15 mL 0       Past History     Past Medical History:  Past Medical History:   Diagnosis Date   ??? Eczema        Past Surgical History:  Past Surgical History:   Procedure Laterality Date   ??? HX ANKLE FRACTURE TX      with reconstruction   ??? HX CESAREAN SECTION  2015       Family History:  No family history on file.    Social History:  Social History     Tobacco Use   ??? Smoking status: Current Every Day Smoker     Packs/day: 0.25   ??? Smokeless tobacco: Never Used   Substance Use Topics   ??? Alcohol use: No   ??? Drug use: No       Allergies:  No Known Allergies      Review of Systems   Review of Systems   Constitutional: Negative for chills and fever.   Musculoskeletal: Positive for arthralgias and joint swelling.   Skin: Positive for wound.   All other systems reviewed and are negative.    All Other Systems Negative  Physical Exam     Vitals:    10/02/17 2208   BP: (!) 149/94   Pulse: 86    Resp: 18   Temp: 98.7 ??F (37.1 ??C)   SpO2: 98%   Weight: 96.6 kg (213 lb)   Height:  (1.626 m)     Physical Exam   Constitutional: She appears well-developed and well-nourished. No distress.   HENT:   Head: Normocephalic and atraumatic.   Right Ear: External ear normal.   Left Ear: External ear normal.   Nose: Nose normal.   Eyes: Conjunctivae are normal.   Neck: Normal range of motion. Neck supple.   Musculoskeletal:        Hands:  Neurological: She is alert.   Skin: Skin is warm and dry. She is not diaphoretic.   Psychiatric: She has a normal mood and affect.              Diagnostic Study Results     Labs -   No results found for this or any previous visit (from the past 12 hour(s)).    Radiologic Studies -  No orders to display     CT Results  (Last 48 hours)    None        CXR Results  (Last 48 hours)    None            Medical Decision Making   I am the first provider for this patient.    I reviewed the vital signs, available nursing notes, past medical history, past surgical history, family history and social history.    Vital Signs-Reviewed the patient's vital signs.      Pulse Oximetry Analysis - 98% on RA    Records Reviewed: Nursing Notes    Procedures:  Procedures    Provider Notes (Medical Decision Making): pt with ring stuck on right ring finger with associated swelling and redness. Has attempted to remove at home w/o success.     10:27 PM ring removed with ring cutter. No complications.    MED RECONCILIATION:  No current facility-administered medications for this encounter.      Current Outpatient Medications   Medication Sig   ??? naphazoline-pheniramine (NAPHCON-A) 0.025-0.3 % ophthalmic solution Administer 2 Drops to both eyes three (3) times daily as needed for Pain.       Disposition:  home    DISCHARGE NOTE:     Pt has been reexamined.  Patient has no new complaints, changes, or physical findings.  Care plan outlined and precautions discussed.   All  medications were reviewed with the patient; will d/c home. All of pt's questions and concerns were addressed. Patient was instructed and agrees to follow up with pcp, as well as to return to the ED upon further deterioration. Patient is ready to go home.    Follow-up Information     Follow up With Specialties Details Why Contact Info    [facility] EM[facility] Emergency Medicine  As needed [address] [place] [address]  [phone]    [facility]    [address]  [place] [address]  [phone]          Current Discharge Medication List            Diagnosis     Clinical Impression:   1. Swelling of right ring finger    2. Tight ring on finger

## 2017-10-02 NOTE — ED Triage Notes (Signed)
Pt c/o right finger swelling, ring got stuck

## 2017-10-02 NOTE — ED Notes (Signed)
Grey toned ring to right fourth finger, distally a large amount of redness and swelling with decreased cap refill at finger tip. Attempt to use [place] and slip ring off unsuccessful. Manual ring cutter used x1 to band, removed ring without difficulty, patient tolerated well. No lacerations, abrasions noted to area where ring cutter was applied. Patient able to flex and extend finger after. Good cap refill after.

## 2017-10-03 ENCOUNTER — Inpatient Hospital Stay
Admit: 2017-10-03 | Discharge: 2017-10-03 | Disposition: A | Discharge status: Home / Self Care | Payer: MEDICAID | Attending: Emergency Medicine

## 2017-10-05 ENCOUNTER — Inpatient Hospital Stay (HOSPITAL_COMMUNITY)
Admission: AD | Admit: 2017-10-05 | Discharge: 2017-10-05 | Disposition: A | Discharge status: Home / Self Care | Payer: Self-pay | Source: Ambulatory Visit | Attending: Obstetrics & Gynecology | Admitting: Obstetrics & Gynecology

## 2017-10-05 ENCOUNTER — Other Ambulatory Visit: Payer: Self-pay

## 2017-10-05 ENCOUNTER — Encounter (HOSPITAL_COMMUNITY): Payer: Self-pay

## 2017-10-05 DIAGNOSIS — Z888 Allergy status to other drugs, medicaments and biological substances status: Secondary | ICD-10-CM | POA: Insufficient documentation

## 2017-10-05 DIAGNOSIS — N946 Dysmenorrhea, unspecified: Secondary | ICD-10-CM

## 2017-10-05 DIAGNOSIS — B9689 Other specified bacterial agents as the cause of diseases classified elsewhere: Secondary | ICD-10-CM

## 2017-10-05 DIAGNOSIS — F17210 Nicotine dependence, cigarettes, uncomplicated: Secondary | ICD-10-CM | POA: Insufficient documentation

## 2017-10-05 DIAGNOSIS — Z880 Allergy status to penicillin: Secondary | ICD-10-CM | POA: Insufficient documentation

## 2017-10-05 DIAGNOSIS — N760 Acute vaginitis: Secondary | ICD-10-CM | POA: Insufficient documentation

## 2017-10-05 LAB — URINALYSIS, ROUTINE W REFLEX MICROSCOPIC
Bilirubin Urine: NEGATIVE
Glucose, UA: NEGATIVE mg/dL
Ketones, ur: NEGATIVE mg/dL
Leukocytes, UA: NEGATIVE
Nitrite: NEGATIVE
Protein, ur: NEGATIVE mg/dL
Specific Gravity, Urine: 1.003 — ABNORMAL LOW (ref 1.005–1.030)
pH: 6 (ref 5.0–8.0)

## 2017-10-05 LAB — CBC
HCT: 36.4 % (ref 36.0–46.0)
Hemoglobin: 12.9 g/dL (ref 12.0–15.0)
MCH: 31.2 pg (ref 26.0–34.0)
MCHC: 35.4 g/dL (ref 30.0–36.0)
MCV: 87.9 fL (ref 78.0–100.0)
Platelets: 238 10*3/uL (ref 150–400)
RBC: 4.14 MIL/uL (ref 3.87–5.11)
RDW: 12.5 % (ref 11.5–15.5)
WBC: 5.5 10*3/uL (ref 4.0–10.5)

## 2017-10-05 LAB — POCT PREGNANCY, URINE: Preg Test, Ur: NEGATIVE

## 2017-10-05 LAB — WET PREP, GENITAL
SPERM: NONE SEEN
TRICH WET PREP: NONE SEEN
YEAST WET PREP: NONE SEEN

## 2017-10-05 MED ORDER — METRONIDAZOLE 500 MG PO TABS: 500.0000 mg | ORAL_TABLET | Freq: Two times a day (BID) | ORAL | 0 refills | Status: DC

## 2017-10-05 MED ORDER — IBUPROFEN 800 MG PO TABS: 800.0000 mg | ORAL_TABLET | Freq: Three times a day (TID) | ORAL | 0 refills | Status: DC

## 2017-10-05 MED ORDER — KETOROLAC TROMETHAMINE 60 MG/2ML IM SOLN
60.0000 mg | Freq: Once | INTRAMUSCULAR | Status: AC
  Administered 2017-10-05: 60 mg via INTRAMUSCULAR
  Filled 2017-10-05: qty 2

## 2017-10-05 NOTE — Progress Notes (Addendum)
Nonpregnant on period currently. Here dt severe abd pain during cycle. Took meds for the abdominal pain   Pending urine sample.  1814: Informed pt need urine sample as soon as able  1830: Urine sample provided but noted lot of blood in with urine. Lab notified if will accept. Said pt need to give better sample.   1838: Pregn test done with bloody urine sample   Up to bathroom 1922: Medicated per order  1955: states pain level still 7/10 but wants to go home to eat.   2000: D/c instructions given with pt understanding. Pt left unit via ambulatory

## 2017-10-05 NOTE — MAU Provider Note (Signed)
History     CSN: [id number]  Arrival date and time: 10/05/17 1732   First Provider Initiated Contact with Patient 10/05/17 1843      Chief Complaint  Patient presents with  . Abdominal Cramping   HPI Elizabeth [name] is a 27 y.o. G1P0010 non pregnant female who presents with lower abdominal pain with her period. This is an ongoing problem. She states she has this pain every month with her cycle. She rates the pain a 7/10 and has tried Midol with no relief. She wants to be tested for endometriosis and desires fertility testing. Patient had new gyn appointment in the clinic in March that she did not keep due to "family problems."  OB History    Gravida  1   Para      Term      Preterm      AB  1   Living        SAB      TAB  1   Ectopic      Multiple      Live Births              Past Medical History:  Diagnosis Date  . Anxiety   . Bipolar 1 disorder [facility])     Past Surgical History:  Procedure Laterality Date  . ABSCESS DRAINAGE     left groin  . HEEL SPUR SURGERY    . INCISION AND DRAINAGE ABSCESS Left 03/03/2016   Procedure: INCISION AND DRAINAGE ABSCESS;  Surgeon: Leafy Ro, MD;  Location: [facility] ORS;  Service: General;  Laterality: Left;  . right foot surgery    . TONSILLECTOMY AND ADENOIDECTOMY      Family History  Problem Relation Age of Onset  . Alcohol abuse Father   . Alcohol abuse Maternal Grandfather   . Alcohol abuse Paternal Grandfather     Social History   Tobacco Use  . Smoking status: Current Every Day Smoker    Packs/day: 0.50    Types: Cigarettes  . Smokeless tobacco: Never Used  Substance Use Topics  . Alcohol use: Yes    Alcohol/week: 0.6 oz    Types: 1 Glasses of wine per week    Comment: occasional  . Drug use: Yes    Types: Marijuana    Comment: history of drug abuse/cocaine and narcotics. Used marijuana 2 days ago. Denies cocaine use.    Allergies:  Allergies  Allergen Reactions  . Depakote [Divalproex  Sodium] Anaphylaxis  . Penicillins Anaphylaxis and Other (See Comments)    Has patient had a PCN reaction causing immediate rash, facial/tongue/throat swelling, SOB or lightheadedness with hypotension: Yes Has patient had a PCN reaction causing severe rash involving mucus membranes or skin necrosis: No Has patient had a PCN reaction that required hospitalization No Has patient had a PCN reaction occurring within the last 10 years: No If all of the above answers are "NO", then may proceed with Cephalosporin use.  [name] Risperidone And Related Other (See Comments)    Weight gain  . Trazodone And Nefazodone Other (See Comments)    Pt states that she does not like how this medication makes her feel.   . Vistaril [Hydroxyzine Hcl] Itching  . Peanut Butter Flavor Itching and Rash    Reaction to peanut butter only, tolerates peanuts.    Medications Prior to Admission  Medication Sig Dispense Refill Last Dose  . loratadine-pseudoephedrine (CLARITIN-D 24-HOUR) 10-240 MG 24 hr tablet Take 1 tablet by mouth  daily as needed for allergies.   Past Month at Unknown time  . Multiple Vitamin (MULTIVITAMIN WITH MINERALS) TABS tablet Take 1 tablet by mouth daily. 7 tablet 0 10/05/2017 at Unknown time  . methocarbamol (ROBAXIN) 500 MG tablet Take 1 tablet (500 mg total) by mouth at bedtime as needed for muscle spasms. (Patient not taking: Reported on 07/17/2017) 20 tablet 0 Not Taking at Unknown time  . metroNIDAZOLE (FLAGYL) 500 MG tablet Take 1 tablet (500 mg total) by mouth 2 (two) times daily. (Patient not taking: Reported on 10/05/2017) 14 tablet 0 Not Taking at Unknown time  . naproxen (NAPROSYN) 500 MG tablet Take 1 tablet (500 mg total) by mouth 2 (two) times daily with a meal. (Patient not taking: Reported on 07/17/2017) 30 tablet 0 Not Taking at Unknown time    Review of Systems  Constitutional: Negative.  Negative for fatigue and fever.  HENT: Negative.   Respiratory: Negative.  Negative for shortness of  breath.   Cardiovascular: Negative.  Negative for chest pain.  Gastrointestinal: Positive for abdominal pain. Negative for constipation, diarrhea, nausea and vomiting.  Genitourinary: Positive for menstrual problem, pelvic pain and vaginal bleeding. Negative for dysuria.  Neurological: Negative.  Negative for dizziness and headaches.   Physical Exam   Blood pressure 110/69, pulse 100, temperature 99.1 F (37.3 C), temperature source Oral, resp. rate 18.  Physical Exam  Nursing note and vitals reviewed. Constitutional: She is oriented to person, place, and time. She appears well-developed and well-nourished. No distress.  HENT:  Head: Normocephalic.  Eyes: Pupils are equal, round, and reactive to light.  Cardiovascular: Normal rate, regular rhythm and normal heart sounds.  Respiratory: Effort normal and breath sounds normal. No respiratory distress.  GI: Soft. Bowel sounds are normal. She exhibits no distension. There is no tenderness. There is no rebound and no guarding.  Genitourinary: No vaginal discharge found.  Genitourinary Comments: Small amount of bright red bleeding  Neurological: She is alert and oriented to person, place, and time.  Skin: Skin is warm and dry.  Psychiatric: She has a normal mood and affect. Her behavior is normal. Judgment and thought content normal.    MAU Course  Procedures Results for orders placed or performed during the hospital encounter of 10/05/17 (from the past 24 hour(s))  Pregnancy, urine POC     Status: None   Collection Time: 10/05/17  6:40 PM  Result Value Ref Range   Preg Test, Ur NEGATIVE NEGATIVE  CBC     Status: None   Collection Time: 10/05/17  7:01 PM  Result Value Ref Range   WBC 5.5 4.0 - 10.5 K/uL   RBC 4.14 3.87 - 5.11 MIL/uL   Hemoglobin 12.9 12.0 - 15.0 g/dL   HCT 16.1 09.6 - 04.5 %   MCV 87.9 78.0 - 100.0 fL   MCH 31.2 26.0 - 34.0 pg   MCHC 35.4 30.0 - 36.0 g/dL   RDW 40.9 81.1 - 91.4 %   Platelets 238 150 - 400 K/uL   Wet prep, genital     Status: Abnormal   Collection Time: 10/05/17  7:07 PM  Result Value Ref Range   Yeast Wet Prep HPF POC NONE SEEN NONE SEEN   Trich, Wet Prep NONE SEEN NONE SEEN   Clue Cells Wet Prep HPF POC PRESENT (A) NONE SEEN   WBC, Wet Prep HPF POC FEW (A) NONE SEEN   Sperm NONE SEEN   Urinalysis, Routine w reflex microscopic  Status: Abnormal   Collection Time: 10/05/17  7:18 PM  Result Value Ref Range   Color, Urine STRAW (A) YELLOW   APPearance CLEAR CLEAR   Specific Gravity, Urine 1.003 (L) 1.005 - 1.030   pH 6.0 5.0 - 8.0   Glucose, UA NEGATIVE NEGATIVE mg/dL   Hgb urine dipstick LARGE (A) NEGATIVE   Bilirubin Urine NEGATIVE NEGATIVE   Ketones, ur NEGATIVE NEGATIVE mg/dL   Protein, ur NEGATIVE NEGATIVE mg/dL   Nitrite NEGATIVE NEGATIVE   Leukocytes, UA NEGATIVE NEGATIVE   RBC / HPF 0-5 0 - 5 RBC/hpf   WBC, UA 0-5 0 - 5 WBC/hpf   Bacteria, UA MANY (A) NONE SEEN   Squamous Epithelial / LPF 0-5 0 - 5   MDM UA, UPT CBC Wet prep and gc/chlamydia Toradol  IM  Patient had many requests for physical exams and tests that should be managed outpatient. Discussed with patient importance of rescheduling new gyn appointment in the clinic and establishing care with PCP.   Assessment and Plan   1. Dysmenorrhea   2. Bacterial vaginosis    -Discharge home in stable condition -Rx for metronidazole and ibuprofen sent to patient's pharmacy -Patient advised to follow-up with [facility] and PCP to establish care -Patient may return to MAU as needed or if her condition were to change or worsen  Rolm Bookbinder CNM 10/05/2017, 6:43 PM   Allergies as of 10/05/2017      Reactions   Depakote [divalproex Sodium] Anaphylaxis   Penicillins Anaphylaxis, Other (See Comments)   Has patient had a PCN reaction causing immediate rash, facial/tongue/throat swelling, SOB or lightheadedness with hypotension: Yes Has patient had a PCN reaction causing severe rash involving mucus membranes  or skin necrosis: No Has patient had a PCN reaction that required hospitalization No Has patient had a PCN reaction occurring within the last 10 years: No If all of the above answers are "NO", then may proceed with Cephalosporin use.   Risperidone And Related Other (See Comments)   Weight gain   Trazodone And Nefazodone Other (See Comments)   Pt states that she does not like how this medication makes her feel.    Vistaril [hydroxyzine Hcl] Itching   Peanut Butter Flavor Itching, Rash   Reaction to peanut butter only, tolerates peanuts.      Medication List    STOP taking these medications   methocarbamol 500 MG tablet Commonly known as:  ROBAXIN   naproxen 500 MG tablet Commonly known as:  NAPROSYN     TAKE these medications   ibuprofen 800 MG tablet Commonly known as:  ADVIL,MOTRIN Take 1 tablet (800 mg total) by mouth 3 (three) times daily.   loratadine-pseudoephedrine 10-240 MG 24 hr tablet Commonly known as:  CLARITIN-D 24-hour Take 1 tablet by mouth daily as needed for allergies.   metroNIDAZOLE 500 MG tablet Commonly known as:  FLAGYL Take 1 tablet (500 mg total) by mouth 2 (two) times daily.   multivitamin with minerals Tabs tablet Take 1 tablet by mouth daily.

## 2017-10-05 NOTE — Discharge Instructions (Signed)
Bacterial Vaginosis Bacterial vaginosis is a vaginal infection that occurs when the normal balance of bacteria in the vagina is disrupted. It results from an overgrowth of certain bacteria. This is the most common vaginal infection among women ages 7-44. Because bacterial vaginosis increases your risk for STIs (sexually transmitted infections), getting treated can help reduce your risk for chlamydia, gonorrhea, herpes, and HIV (human immunodeficiency virus). Treatment is also important for preventing complications in pregnant women, because this condition can cause an early (premature) delivery. What are the causes? This condition is caused by an increase in harmful bacteria that are normally present in small amounts in the vagina. However, the reason that the condition develops is not fully understood. What increases the risk? The following factors may make you more likely to develop this condition:  Having a new sexual partner or multiple sexual partners.  Having unprotected sex.  Douching.  Having an intrauterine device (IUD).  Smoking.  Drug and alcohol abuse.  Taking certain antibiotic medicines.  Being pregnant.  You cannot get bacterial vaginosis from toilet seats, bedding, swimming pools, or contact with objects around you. What are the signs or symptoms? Symptoms of this condition include:  Grey or white vaginal discharge. The discharge can also be watery or foamy.  A fish-like odor with discharge, especially after sexual intercourse or during menstruation.  Itching in and around the vagina.  Burning or pain with urination.  Some women with bacterial vaginosis have no signs or symptoms. How is this diagnosed? This condition is diagnosed based on:  Your medical history.  A physical exam of the vagina.  Testing a sample of vaginal fluid under a microscope to look for a large amount of bad bacteria or abnormal cells. Your health care provider may use a cotton swab  or a small wooden spatula to collect the sample.  How is this treated? This condition is treated with antibiotics. These may be given as a pill, a vaginal cream, or a medicine that is put into the vagina (suppository). If the condition comes back after treatment, a second round of antibiotics may be needed. Follow these instructions at home: Medicines  Take over-the-counter and prescription medicines only as told by your health care provider.  Take or use your antibiotic as told by your health care provider. Do not stop taking or using the antibiotic even if you start to feel better. General instructions  If you have a female sexual partner, tell her that you have a vaginal infection. She should see her health care provider and be treated if she has symptoms. If you have a female sexual partner, he does not need treatment.  During treatment: ? Avoid sexual activity until you finish treatment. ? Do not douche. ? Avoid alcohol as directed by your health care provider. ? Avoid breastfeeding as directed by your health care provider.  Drink enough water and fluids to keep your urine clear or pale yellow.  Keep the area around your vagina and rectum clean. ? Wash the area daily with warm water. ? Wipe yourself from front to back after using the toilet.  Keep all follow-up visits as told by your health care provider. This is important. How is this prevented?  Do not douche.  Wash the outside of your vagina with warm water only.  Use protection when having sex. This includes latex condoms and dental dams.  Limit how many sexual partners you have. To help prevent bacterial vaginosis, it is best to have sex with just  one partner (monogamous).  Make sure you and your sexual partner are tested for STIs.  Wear cotton or cotton-lined underwear.  Avoid wearing tight pants and pantyhose, especially during summer.  Limit the amount of alcohol that you drink.  Do not use any products that  contain nicotine or tobacco, such as cigarettes and e-cigarettes. If you need help quitting, ask your health care provider.  Do not use illegal drugs. Where to find more information:  Centers for Disease Control and Prevention: SolutionApps.co.za  American Sexual Health Association (ASHA): www.ashastd.org  U.S. Department of Health and Health and safety inspector, Office on Women's Health: ConventionalMedicines.si or http://www.anderson-williamson.info/ Contact a health care provider if:  Your symptoms do not improve, even after treatment.  You have more discharge or pain when urinating.  You have a fever.  You have pain in your abdomen.  You have pain during sex.  You have vaginal bleeding between periods. Summary  Bacterial vaginosis is a vaginal infection that occurs when the normal balance of bacteria in the vagina is disrupted.  Because bacterial vaginosis increases your risk for STIs (sexually transmitted infections), getting treated can help reduce your risk for chlamydia, gonorrhea, herpes, and HIV (human immunodeficiency virus). Treatment is also important for preventing complications in pregnant women, because the condition can cause an early (premature) delivery.  This condition is treated with antibiotic medicines. These may be given as a pill, a vaginal cream, or a medicine that is put into the vagina (suppository). This information is not intended to replace advice given to you by your health care provider. Make sure you discuss any questions you have with your health care provider. Document Released: 05/22/2005 Document Revised: 09/25/2016 Document Reviewed: 02/05/2016 Elsevier Interactive Patient Education  2018 [employer].  Primary care follow up  [facility] (will see you even if you do not have sickle cell): [phone] [facility]: [phone] [facility]: [phone]

## 2017-10-08 LAB — GC/CHLAMYDIA PROBE AMP (~~LOC~~) NOT AT ARMC
Chlamydia: NEGATIVE
NEISSERIA GONORRHEA: NEGATIVE

## 2017-12-26 ENCOUNTER — Encounter

## 2017-12-26 ENCOUNTER — Encounter: Payer: BLUE CROSS/BLUE SHIELD | Admitting: Obstetrics and Gynecology

## 2018-01-15 ENCOUNTER — Emergency Department (HOSPITAL_COMMUNITY)
Admission: EM | Admit: 2018-01-15 | Discharge: 2018-01-15 | Disposition: A | Discharge status: Home / Self Care | Payer: BLUE CROSS/BLUE SHIELD | Attending: Emergency Medicine | Admitting: Emergency Medicine

## 2018-01-15 ENCOUNTER — Other Ambulatory Visit: Payer: Self-pay

## 2018-01-15 ENCOUNTER — Encounter (HOSPITAL_COMMUNITY): Payer: Self-pay

## 2018-01-15 DIAGNOSIS — Y939 Activity, unspecified: Secondary | ICD-10-CM | POA: Diagnosis not present

## 2018-01-15 DIAGNOSIS — F1290 Cannabis use, unspecified, uncomplicated: Secondary | ICD-10-CM | POA: Diagnosis not present

## 2018-01-15 DIAGNOSIS — Y999 Unspecified external cause status: Secondary | ICD-10-CM | POA: Insufficient documentation

## 2018-01-15 DIAGNOSIS — S161XXA Strain of muscle, fascia and tendon at neck level, initial encounter: Secondary | ICD-10-CM | POA: Insufficient documentation

## 2018-01-15 DIAGNOSIS — M542 Cervicalgia: Secondary | ICD-10-CM | POA: Diagnosis not present

## 2018-01-15 DIAGNOSIS — Z79899 Other long term (current) drug therapy: Secondary | ICD-10-CM | POA: Insufficient documentation

## 2018-01-15 DIAGNOSIS — S39012A Strain of muscle, fascia and tendon of lower back, initial encounter: Secondary | ICD-10-CM | POA: Diagnosis not present

## 2018-01-15 DIAGNOSIS — Y929 Unspecified place or not applicable: Secondary | ICD-10-CM | POA: Diagnosis not present

## 2018-01-15 DIAGNOSIS — F17210 Nicotine dependence, cigarettes, uncomplicated: Secondary | ICD-10-CM | POA: Diagnosis not present

## 2018-01-15 DIAGNOSIS — M5489 Other dorsalgia: Secondary | ICD-10-CM | POA: Diagnosis not present

## 2018-01-15 DIAGNOSIS — Z041 Encounter for examination and observation following transport accident: Secondary | ICD-10-CM | POA: Diagnosis present

## 2018-01-15 LAB — POC URINE PREG, ED: Preg Test, Ur: NEGATIVE

## 2018-01-15 MED ORDER — METHOCARBAMOL 500 MG PO TABS: 500.0000 mg | ORAL_TABLET | Freq: Every evening | ORAL | 0 refills | Status: DC | PRN

## 2018-01-15 NOTE — Discharge Instructions (Signed)
Take ibuprofen 3 times a day with meals. Take 800 mg (4 tablets) at a time. Do not take other anti-inflammatories at the same time (Advil, Motrin, naproxen, Aleve). You may supplement with Tylenol if you need further pain control. Use robaxin as needed for muscle stiffness or soreness.  Have caution, this may make you tired or groggy.  Do not drive or operate heavy machinery while taking this medicine. Use ice packs or heating pads if this helps control your pain. You will likely have continued muscle stiffness and soreness over the next couple days.  Follow-up in 1 week if your symptoms are not improving. Return to the emergency room if you develop vision changes, vomiting, slurred speech, numbness, loss of bowel or bladder control, or any new or worsening symptoms.

## 2018-01-15 NOTE — ED Notes (Signed)
Pt reports that she has posterior neck pain, and lower back pain 4/10. Pt report that she does not want a xray today. Pt reports that she want medications for her discomfort and wants to leave.
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
## 2018-01-18 ENCOUNTER — Ambulatory Visit: Payer: BLUE CROSS/BLUE SHIELD | Admitting: Obstetrics and Gynecology

## 2018-01-18 ENCOUNTER — Other Ambulatory Visit (HOSPITAL_COMMUNITY)
Admission: RE | Admit: 2018-01-18 | Discharge: 2018-01-18 | Disposition: A | Discharge status: Home / Self Care | Payer: BLUE CROSS/BLUE SHIELD | Source: Ambulatory Visit | Attending: Obstetrics and Gynecology | Admitting: Obstetrics and Gynecology

## 2018-01-18 ENCOUNTER — Encounter: Payer: Self-pay | Admitting: Obstetrics and Gynecology

## 2018-01-18 ENCOUNTER — Other Ambulatory Visit: Payer: Self-pay

## 2018-01-18 VITALS — BP 110/60 | HR 76 | Resp 16 | Ht 64.5 in | Wt 183.0 lb

## 2018-01-18 DIAGNOSIS — N926 Irregular menstruation, unspecified: Secondary | ICD-10-CM | POA: Diagnosis not present

## 2018-01-18 DIAGNOSIS — Z01419 Encounter for gynecological examination (general) (routine) without abnormal findings: Secondary | ICD-10-CM | POA: Insufficient documentation

## 2018-01-18 DIAGNOSIS — B9689 Other specified bacterial agents as the cause of diseases classified elsewhere: Secondary | ICD-10-CM | POA: Diagnosis not present

## 2018-01-18 DIAGNOSIS — Z113 Encounter for screening for infections with a predominantly sexual mode of transmission: Secondary | ICD-10-CM

## 2018-01-18 DIAGNOSIS — N761 Subacute and chronic vaginitis: Secondary | ICD-10-CM | POA: Insufficient documentation

## 2018-01-18 DIAGNOSIS — R8290 Unspecified abnormal findings in urine: Secondary | ICD-10-CM | POA: Diagnosis not present

## 2018-01-18 DIAGNOSIS — N946 Dysmenorrhea, unspecified: Secondary | ICD-10-CM

## 2018-01-18 LAB — POCT URINALYSIS DIPSTICK
Bilirubin, UA: NEGATIVE
Glucose, UA: NEGATIVE
Ketones, UA: NEGATIVE
LEUKOCYTES UA: NEGATIVE
Nitrite, UA: NEGATIVE
Odor: POSITIVE
PROTEIN UA: NEGATIVE
Urobilinogen, UA: 0.2 E.U./dL
pH, UA: 6 (ref 5.0–8.0)

## 2018-01-18 MED ORDER — NAPROXEN SODIUM 550 MG PO TABS: 550.0000 mg | ORAL_TABLET | Freq: Two times a day (BID) | ORAL | 1 refills | Status: DC
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
## 2018-01-19 LAB — HEPATITIS C ANTIBODY

## 2018-01-19 LAB — PROLACTIN: Prolactin: 18.7 ng/mL (ref 4.8–23.3)

## 2018-01-19 LAB — TSH: TSH: 0.719 u[IU]/mL (ref 0.450–4.500)

## 2018-01-19 LAB — HEPATITIS B SURFACE ANTIGEN: HEP B S AG: NEGATIVE

## 2018-01-21 ENCOUNTER — Telehealth: Payer: Self-pay | Admitting: *Deleted

## 2018-01-21 LAB — CYTOLOGY - PAP
Bacterial vaginitis: POSITIVE — AB
CANDIDA VAGINITIS: NEGATIVE
DIAGNOSIS: NEGATIVE
HPV (WINDOPATH): NOT DETECTED
Trichomonas: NEGATIVE

## 2018-01-21 NOTE — Telephone Encounter (Signed)
-----   Message from Patton SallesBrook E Amundson C Silva, MD sent at 01/21/2018 11:35 AM EDT ----- Please contact patient with results.  Her urine culture is showing infection, and I am waiting for the final identification and sensitivities.  If she can wait, hopefully results will be back soon so we can prescribe an antibiotic.  If she is feeling poorly, I would recommend bactrim DS po bid x 3 days.  Hep B and C are negative.   Prolactin and TSH are normal.

## 2018-01-21 NOTE — Telephone Encounter (Signed)
Message left to return call to Triage Nurse at [phone].   Please route to Triage if I am unavailable.    

## 2018-01-21 NOTE — Telephone Encounter (Signed)
Message left to return call to Triage Nurse at [phone].    

## 2018-01-21 NOTE — Telephone Encounter (Signed)
Patient returning Emily's call. °

## 2018-01-22 LAB — URINE CULTURE

## 2018-01-22 MED ORDER — SULFAMETHOXAZOLE-TRIMETHOPRIM 800-160 MG PO TABS: 1.0000 | ORAL_TABLET | Freq: Two times a day (BID) | ORAL | 0 refills | Status: AC

## 2018-01-22 NOTE — Telephone Encounter (Signed)
Pt returned call. Message from Dr. Edward JollySilva given.  Pt would like to start treatment. Instructions given for Bactrim and sent to pharmacy, start now.  Advised will call back with final results.  As of now, final culture is still pending.  [facility] contacted and confirmed urine culture is pending.  Encounter closed.

## 2018-01-23 ENCOUNTER — Other Ambulatory Visit: Payer: Self-pay | Admitting: *Deleted

## 2018-01-23 MED ORDER — METRONIDAZOLE 500 MG PO TABS: 500.0000 mg | ORAL_TABLET | Freq: Two times a day (BID) | ORAL | 0 refills | Status: DC

## 2018-01-26 ENCOUNTER — Emergency Department (HOSPITAL_COMMUNITY): Payer: BLUE CROSS/BLUE SHIELD

## 2018-01-26 ENCOUNTER — Emergency Department (HOSPITAL_COMMUNITY)
Admission: EM | Admit: 2018-01-26 | Discharge: 2018-01-26 | Disposition: A | Discharge status: Home / Self Care | Payer: BLUE CROSS/BLUE SHIELD | Attending: Emergency Medicine | Admitting: Emergency Medicine

## 2018-01-26 ENCOUNTER — Other Ambulatory Visit: Payer: Self-pay

## 2018-01-26 ENCOUNTER — Encounter (HOSPITAL_COMMUNITY): Payer: Self-pay | Admitting: Emergency Medicine

## 2018-01-26 DIAGNOSIS — F17210 Nicotine dependence, cigarettes, uncomplicated: Secondary | ICD-10-CM | POA: Diagnosis not present

## 2018-01-26 DIAGNOSIS — S3992XA Unspecified injury of lower back, initial encounter: Secondary | ICD-10-CM | POA: Diagnosis not present

## 2018-01-26 DIAGNOSIS — M545 Low back pain, unspecified: Secondary | ICD-10-CM

## 2018-01-26 DIAGNOSIS — Z79899 Other long term (current) drug therapy: Secondary | ICD-10-CM | POA: Diagnosis not present

## 2018-01-26 DIAGNOSIS — B9789 Other viral agents as the cause of diseases classified elsewhere: Secondary | ICD-10-CM | POA: Diagnosis not present

## 2018-01-26 DIAGNOSIS — J45909 Unspecified asthma, uncomplicated: Secondary | ICD-10-CM | POA: Diagnosis not present

## 2018-01-26 DIAGNOSIS — R05 Cough: Secondary | ICD-10-CM | POA: Diagnosis not present

## 2018-01-26 DIAGNOSIS — J069 Acute upper respiratory infection, unspecified: Secondary | ICD-10-CM | POA: Diagnosis not present

## 2018-01-26 LAB — POC URINE PREG, ED: PREG TEST UR: NEGATIVE

## 2018-01-26 LAB — GROUP A STREP BY PCR: Group A Strep by PCR: NOT DETECTED

## 2018-01-26 MED ORDER — ACETAMINOPHEN 325 MG PO TABS
650.0000 mg | ORAL_TABLET | Freq: Once | ORAL | Status: AC | PRN
  Administered 2018-01-26: 650 mg via ORAL
  Filled 2018-01-26: qty 2

## 2018-01-26 MED ORDER — NAPROXEN 500 MG PO TABS: 500.0000 mg | ORAL_TABLET | Freq: Two times a day (BID) | ORAL | 0 refills | Status: DC

## 2018-01-26 MED ORDER — METHOCARBAMOL 500 MG PO TABS: 500.0000 mg | ORAL_TABLET | Freq: Two times a day (BID) | ORAL | 0 refills | Status: DC
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
## 2018-01-29 ENCOUNTER — Telehealth: Payer: Self-pay | Admitting: Obstetrics and Gynecology

## 2018-01-29 NOTE — Telephone Encounter (Signed)
Call placed to patient to review benefits and schedule recommended ultrasound. Left voicemail message requesting a return call °

## 2018-01-30 NOTE — Telephone Encounter (Signed)
Spoke with patient regarding benefit for recommended ultrasound. Patient understood and agreeable. Patient ready to schedule. Patient requested to schedule the second week in September. Patient scheduled 02/14/18 with Dr Edward JollySilva. Patient aware of appointment date, arrival time and cancellation policy. No further questions.   Forwarding to Dr Edward JollySilva for final review. Patient is agreeable to disposition. Will close encounter

## 2018-02-07 ENCOUNTER — Telehealth: Payer: Self-pay | Admitting: Obstetrics and Gynecology

## 2018-02-07 NOTE — Telephone Encounter (Signed)
Spoke with patient, advised as seen below per pharmacy. Patient states she will not be picking up Flagyl until 9/12. Instructed patient to notify pharmacy, so RX will be filled when ready for pick up. Patient is returning for PUS on 9/12, advised I will update Dr. Edward Jolly.   Routing to provider for final review. Patient is agreeable to disposition. Will close encounter.

## 2018-02-07 NOTE — Telephone Encounter (Signed)
Patient requesting a new prescription for Flagyl. Patient stated that the pharmacy said they did not have the prescription on file.

## 2018-02-07 NOTE — Telephone Encounter (Signed)
Spoke with Selena Batten at [facility]. Was advised RX received for Flagyl on 01/23/18, was filled and placed on hold due to patient not picking up Rx. Rx will be filled and available today.

## 2018-02-07 NOTE — Telephone Encounter (Signed)
Attempted home number on file, number busy.   Left message on mobile number to call Noreene Larsson at [phone].

## 2018-02-14 ENCOUNTER — Ambulatory Visit (INDEPENDENT_AMBULATORY_CARE_PROVIDER_SITE_OTHER): Payer: BLUE CROSS/BLUE SHIELD

## 2018-02-14 ENCOUNTER — Encounter: Payer: Self-pay | Admitting: Obstetrics and Gynecology

## 2018-02-14 ENCOUNTER — Ambulatory Visit (INDEPENDENT_AMBULATORY_CARE_PROVIDER_SITE_OTHER): Payer: BLUE CROSS/BLUE SHIELD | Admitting: Obstetrics and Gynecology

## 2018-02-14 VITALS — BP 114/60 | HR 68 | Resp 16 | Ht 64.5 in | Wt 181.0 lb

## 2018-02-14 DIAGNOSIS — N760 Acute vaginitis: Secondary | ICD-10-CM | POA: Diagnosis not present

## 2018-02-14 DIAGNOSIS — N946 Dysmenorrhea, unspecified: Secondary | ICD-10-CM

## 2018-02-14 DIAGNOSIS — B9689 Other specified bacterial agents as the cause of diseases classified elsewhere: Secondary | ICD-10-CM

## 2018-02-14 DIAGNOSIS — N921 Excessive and frequent menstruation with irregular cycle: Secondary | ICD-10-CM | POA: Diagnosis not present

## 2018-02-14 DIAGNOSIS — R3915 Urgency of urination: Secondary | ICD-10-CM

## 2018-02-14 LAB — POCT URINALYSIS DIPSTICK
BILIRUBIN UA: NEGATIVE
Glucose, UA: NEGATIVE
KETONES UA: NEGATIVE
Leukocytes, UA: NEGATIVE
NITRITE UA: NEGATIVE
PH UA: 5 (ref 5.0–8.0)
PROTEIN UA: NEGATIVE
UROBILINOGEN UA: 0.2 U/dL

## 2018-02-14 MED ORDER — SULFAMETHOXAZOLE-TRIMETHOPRIM 800-160 MG PO TABS: 1.0000 | ORAL_TABLET | Freq: Two times a day (BID) | ORAL | 0 refills | Status: DC

## 2018-02-14 MED ORDER — METRONIDAZOLE 500 MG PO TABS: 500.0000 mg | ORAL_TABLET | Freq: Two times a day (BID) | ORAL | 0 refills | Status: DC

## 2018-02-14 MED ORDER — NORETHINDRONE-ETH ESTRADIOL 1-35 MG-MCG PO TABS: 1.0000 | ORAL_TABLET | Freq: Every day | ORAL | 3 refills | Status: DC

## 2018-02-14 NOTE — Patient Instructions (Signed)
Consider reaching out to the [employer] [place] for assistance.
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
## 2018-02-15 LAB — URINALYSIS, MICROSCOPIC ONLY
BACTERIA UA: NONE SEEN
CASTS: NONE SEEN /LPF

## 2018-02-15 LAB — URINE CULTURE

## 2018-02-20 ENCOUNTER — Encounter: Payer: Self-pay | Admitting: Obstetrics and Gynecology

## 2018-03-04 ENCOUNTER — Encounter: Payer: Self-pay | Admitting: Obstetrics and Gynecology

## 2018-03-04 ENCOUNTER — Other Ambulatory Visit: Payer: Self-pay

## 2018-03-04 ENCOUNTER — Ambulatory Visit: Payer: BLUE CROSS/BLUE SHIELD | Admitting: Obstetrics and Gynecology

## 2018-03-04 VITALS — BP 110/78 | HR 94 | Ht 64.75 in | Wt 183.0 lb

## 2018-03-04 DIAGNOSIS — N760 Acute vaginitis: Secondary | ICD-10-CM | POA: Diagnosis not present

## 2018-03-04 DIAGNOSIS — T384X5D Adverse effect of oral contraceptives, subsequent encounter: Secondary | ICD-10-CM | POA: Diagnosis not present
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
## 2018-03-05 ENCOUNTER — Telehealth: Payer: Self-pay | Admitting: *Deleted

## 2018-03-05 LAB — VAGINITIS/VAGINOSIS, DNA PROBE
Candida Species: NEGATIVE
Gardnerella vaginalis: POSITIVE — AB
Trichomonas vaginosis: NEGATIVE

## 2018-03-05 NOTE — Telephone Encounter (Signed)
-----   Message from Patton Salles, MD sent at 03/05/2018  2:22 PM EDT ----- Please inform of Affirm result showing bacterial vaginosis. She may treat with Flagyl 500 mg po bid for 7 days. Please send Rx to pharmacy of choice. ETOH precautions.  She will need to return to the office for vaginitis testing 48 - 72 hours after she finishes her last dose of the Flagyl.  If results are negative for BV, she will then do a 4 - 6 month course of Metrogel.

## 2018-03-05 NOTE — Telephone Encounter (Signed)
Notes recorded by Leda Min, RN on 03/05/2018 at 3:55 PM EDT Left message to call Noreene Larsson at [phone].

## 2018-03-06 MED ORDER — METRONIDAZOLE 500 MG PO TABS: 500.0000 mg | ORAL_TABLET | Freq: Two times a day (BID) | ORAL | 0 refills | Status: DC

## 2018-03-06 NOTE — Telephone Encounter (Signed)
Spoke with patient, advised as seen below per Dr. Edward Jolly. Patient will not be able to oick up medication immediately. Will start medication on 03/11/18, OV for recheck scheduled for 10/16 at 9:45am with Dr. Edward Jolly. Instructed patient to return call to office if she starts medication later, will need to reschedule f/u. Patient verbalizes understanding and is agreeable.   Encounter closed.

## 2018-03-06 NOTE — Telephone Encounter (Signed)
Patient returning call to [place]. Patient stated that she has to go to work at 10:30am.

## 2018-03-20 ENCOUNTER — Ambulatory Visit: Payer: Self-pay | Admitting: Obstetrics and Gynecology

## 2018-03-21 ENCOUNTER — Ambulatory Visit: Payer: BLUE CROSS/BLUE SHIELD | Admitting: Obstetrics and Gynecology

## 2018-03-21 ENCOUNTER — Other Ambulatory Visit: Payer: Self-pay

## 2018-03-21 ENCOUNTER — Encounter: Payer: Self-pay | Admitting: Obstetrics and Gynecology

## 2018-03-21 VITALS — BP 114/70 | HR 90 | Ht 64.75 in | Wt 184.6 lb

## 2018-03-21 DIAGNOSIS — R3989 Other symptoms and signs involving the genitourinary system: Secondary | ICD-10-CM

## 2018-03-21 DIAGNOSIS — N926 Irregular menstruation, unspecified: Secondary | ICD-10-CM

## 2018-03-21 DIAGNOSIS — N760 Acute vaginitis: Secondary | ICD-10-CM | POA: Diagnosis not present

## 2018-03-21 LAB — POCT URINALYSIS DIPSTICK
Bilirubin, UA: NEGATIVE
Blood, UA: NEGATIVE
GLUCOSE UA: NEGATIVE
Ketones, UA: NEGATIVE
LEUKOCYTES UA: NEGATIVE
Nitrite, UA: NEGATIVE
Protein, UA: NEGATIVE
Urobilinogen, UA: 0.2 E.U./dL
pH, UA: 5 (ref 5.0–8.0)

## 2018-03-21 LAB — POCT URINE PREGNANCY: PREG TEST UR: NEGATIVE
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
## 2018-03-22 LAB — VAGINITIS/VAGINOSIS, DNA PROBE
Candida Species: NEGATIVE
Gardnerella vaginalis: POSITIVE — AB
Trichomonas vaginosis: NEGATIVE

## 2018-03-23 ENCOUNTER — Encounter: Payer: Self-pay | Admitting: Obstetrics and Gynecology

## 2018-03-25 ENCOUNTER — Encounter: Payer: Self-pay | Admitting: Physician Assistant

## 2018-03-25 ENCOUNTER — Telehealth: Payer: Self-pay | Admitting: Emergency Medicine

## 2018-03-25 ENCOUNTER — Ambulatory Visit: Payer: BLUE CROSS/BLUE SHIELD | Admitting: Physician Assistant

## 2018-03-25 VITALS — BP 128/80 | HR 75 | Temp 98.8°F | Ht 64.75 in | Wt 184.0 lb

## 2018-03-25 DIAGNOSIS — J069 Acute upper respiratory infection, unspecified: Secondary | ICD-10-CM | POA: Diagnosis not present

## 2018-03-25 MED ORDER — AZITHROMYCIN 250 MG PO TABS: ORAL_TABLET | ORAL | 0 refills | Status: DC

## 2018-03-25 NOTE — Patient Instructions (Addendum)
  It was great to see you!  You have a viral upper respiratory infection. Antibiotics are not needed for this.  Viral infections usually take 7-10 days to resolve.  The cough can last a few weeks to go away.  If symptoms do not improve, please start the oral antibiotic, azithromycin.  Push fluids and get plenty of rest. Please return if you are not improving as expected, or if you have high fevers (>101.5) or difficulty swallowing or worsening productive cough.  Call clinic with questions.  I hope you start feeling better soon!

## 2018-03-25 NOTE — Telephone Encounter (Signed)
-----   Message from Patton Salles, MD sent at 03/23/2018  7:59 AM EDT ----- Please inform of Affirm result showing bacterial vaginosis. I am recommending she treat with Tindamax 500 mg. Take 2 tablets at once (1000 mg) daily for 5 days.  Dispense:  10 RF:  None.  Please send Rx to pharmacy of choice. ETOH precautions.   I would like to have her return 48 - 72 hours after her last pill of Tindamax so she can be retested.

## 2018-03-25 NOTE — Progress Notes (Signed)
Elizabeth [name] is a 27 y.o. female here for a new problem.  I acted as a Neurosurgeon for [employer], PA-C Corky Mull, LPN  History of Present Illness:   Chief Complaint  Patient presents with  . Cough    Cough  This is a new problem. Episode onset: Started last Wednesday. The problem has been gradually improving. The problem occurs every few hours. The cough is productive of purulent sputum. Associated symptoms include ear congestion, a fever (over the weekend, did not take temp), nasal congestion, postnasal drip and wheezing. Pertinent negatives include no sore throat or shortness of breath. The symptoms are aggravated by lying down. Risk factors for lung disease include occupational exposure. She has tried OTC cough suppressant (Thera Flu ) for the symptoms. Her past medical history is significant for pneumonia (2015). There is no history of asthma or bronchitis.    Eating and drinking okay. Denies chest pain or SOB.   Past Medical History:  Diagnosis Date  . Anxiety   . Bipolar 1 disorder (HCC)   . Chlamydia infection 2005, 2008  . Recurrent vaginitis      Social History   Socioeconomic History  . Marital status: Single    Spouse name: Not on file  . Number of children: Not on file  . Years of education: Not on file  . Highest education level: Not on file  Occupational History  . Not on file  Social Needs  . Financial resource strain: Not on file  . Food insecurity:    Worry: Not on file    Inability: Not on file  . Transportation needs:    Medical: Not on file    Non-medical: Not on file  Tobacco Use  . Smoking status: Current Every Day Smoker    Packs/day: 0.50    Types: Cigarettes  . Smokeless tobacco: Never Used  Substance and Sexual Activity  . Alcohol use: Not Currently    Alcohol/week: 1.0 standard drinks    Types: 1 Glasses of wine per week  . Drug use: Yes    Types: Marijuana    Comment: history of drug abuse/cocaine and narcotics. Used  marijuana 2 days ago. Denies cocaine use.  [name] Sexual activity: Yes    Birth control/protection: None  Lifestyle  . Physical activity:    Days per week: Not on file    Minutes per session: Not on file  . Stress: Not on file  Relationships  . Social connections:    Talks on phone: Not on file    Gets together: Not on file    Attends religious service: Not on file    Active member of club or organization: Not on file    Attends meetings of clubs or organizations: Not on file    Relationship status: Not on file  . Intimate partner violence:    Fear of current or ex partner: Not on file    Emotionally abused: Not on file    Physically abused: Not on file    Forced sexual activity: Not on file  Other Topics Concern  . 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 ABSCESS DRAINAGE     left groin  . HEEL SPUR SURGERY    . INCISION AND DRAINAGE ABSCESS Left 03/03/2016   Procedure: INCISION AND  DRAINAGE ABSCESS;  Surgeon: Leafy Ro, MD;  Location: [facility] ORS;  Service: General;  Laterality: Left;  . right foot surgery    . TONSILLECTOMY AND ADENOIDECTOMY      Family History  Problem Relation Age of Onset  . Hypertension Mother   . Thyroid cancer Mother   . Hypertension Father   . Alcohol abuse Paternal Grandfather   . Hypertension Maternal Grandmother   . Cancer Maternal Grandmother   . Heart Problems Paternal Grandmother     Allergies  Allergen Reactions  . Depakote [Divalproex Sodium] Anaphylaxis  . Penicillins Anaphylaxis and Other (See Comments)    Has patient had a PCN reaction causing immediate rash, facial/tongue/throat swelling, SOB or lightheadedness with hypotension: Yes Has patient had a PCN reaction causing severe rash involving mucus membranes or skin necrosis: No Has patient had a PCN reaction that required  hospitalization No Has patient had a PCN reaction occurring within the last 10 years: No If all of the above answers are "NO", then may proceed with Cephalosporin use.  [name] Risperidone And Related Other (See Comments)    Weight gain  . Trazodone And Nefazodone Other (See Comments)    Pt states that she does not like how this medication makes her feel.   . Vistaril [Hydroxyzine Hcl] Itching  . Peanut Butter Flavor Itching and Rash    Reaction to peanut butter only, tolerates peanuts.    Current Medications:   Current Outpatient Medications:  [name]  Multiple Vitamin (MULTIVITAMIN WITH MINERALS) TABS tablet, Take 1 tablet by mouth daily., Disp: 7 tablet, Rfl: 0 .  norethindrone-ethinyl estradiol 1/35 (ORTHO-NOVUM, NORTREL,CYCLAFEM) tablet, Take 1 tablet by mouth daily., Disp: 3 Package, Rfl: 3 .  OVER THE COUNTER MEDICATION, NURVIA  **supplement--for concentration** takes daily, Disp: , Rfl:  .  azithromycin (ZITHROMAX) 250 MG tablet, Take two tablets on day 1, then one tablet daily x 4 days, Disp: 6 tablet, Rfl: 0 .  loratadine-pseudoephedrine (CLARITIN-D 24-HOUR) 10-240 MG 24 hr tablet, Take 1 tablet by mouth daily as needed for allergies., Disp: , Rfl:    Review of Systems:   Review of Systems  Constitutional: Positive for fever (over the weekend, did not take temp).  HENT: Positive for postnasal drip. Negative for sore throat.   Respiratory: Positive for cough and wheezing. Negative for shortness of breath.     Vitals:   Vitals:   03/25/18 1538  BP: 128/80  Pulse: 75  Temp: 98.8 F (37.1 C)  TempSrc: Oral  SpO2: 98%  Weight: 184 lb (83.5 kg)  Height: 5' 4.75" (1.645 m)     Body mass index is 30.86 kg/m.  Physical Exam:   Physical Exam  Constitutional: She appears well-developed. She is cooperative.  Non-toxic appearance. She does not have a sickly appearance. She does not appear ill. No distress.  HENT:  Head: Normocephalic and atraumatic.  Right Ear: External ear and  ear canal normal.  Left Ear: External ear and ear canal normal.  Nose: Nose normal. Right sinus exhibits no maxillary sinus tenderness and no frontal sinus tenderness. Left sinus exhibits no maxillary sinus tenderness and no frontal sinus tenderness.  Mouth/Throat: Uvula is midline and mucous membranes are normal. Posterior oropharyngeal erythema present. No posterior oropharyngeal edema.  Bilateral cerumen impaction, unable to visualize TM  Eyes: Conjunctivae and lids are normal.  Neck: Trachea normal.  Cardiovascular: Normal rate, regular rhythm, S1 normal, S2 normal and normal heart sounds.  Pulmonary/Chest: Effort normal and breath sounds normal. She  has no decreased breath sounds. She has no wheezes. She has no rhonchi. She has no rales.  Lymphadenopathy:    She has no cervical adenopathy.  Neurological: She is alert.  Skin: Skin is warm, dry and intact.  Psychiatric: She has a normal mood and affect. Her speech is normal and behavior is normal.  Nursing note and vitals reviewed.   Assessment and Plan:    [place] was seen today for cough.  Diagnoses and all orders for this visit:  Upper respiratory tract infection, unspecified type  Other orders -     azithromycin (ZITHROMAX) 250 MG tablet; Take two tablets on day 1, then one tablet daily x 4 days   No red flags on exam.  Suspect likely viral etiology at this time. May return for ear lavage when no longer having symptoms -- we deferred today. Did provide safety net prescription of azithromycin if symptoms do not improve or if they worsen. Discussed taking medications as prescribed. Reviewed return precautions including worsening fever, SOB, worsening cough or other concerns. Push fluids and rest. I recommend that patient follow-up if symptoms worsen or persist despite treatment x 7-10 days, sooner if needed.  . Reviewed expectations re: course of current medical issues. . Discussed self-management of symptoms. . Outlined signs  and symptoms indicating need for more acute intervention. . Patient verbalized understanding and all questions were answered. . See orders for this visit as documented in the electronic medical record. . Patient received an After-Visit Summary.  CMA or LPN served as scribe during this visit. History, Physical, and Plan performed by medical provider. The above documentation has been reviewed and is accurate and complete.  Jarold Motto, PA-C

## 2018-03-25 NOTE — Telephone Encounter (Signed)
Left message on mobile to return call

## 2018-03-28 MED ORDER — TINIDAZOLE 500 MG PO TABS: ORAL_TABLET | ORAL | 0 refills | Status: DC

## 2018-03-28 NOTE — Telephone Encounter (Signed)
Spoke with patient and message from Dr. Edward Jolly given.  Pt verbalized understanding of instructions and plan of care.  She is unsure when she will pick up prescription. She will call when she picks up her prescription and let us know what day she starts her tablets so we can schedule her an appropriate follow up appointment.  Needs appointment 48-72 hours after lab tablets of Tindamax.  Encounter closed.

## 2018-03-28 NOTE — Telephone Encounter (Signed)
Message left to return call to [place] at [phone] on  Telephone Information:  Mobile [phone]  .

## 2018-03-28 NOTE — Telephone Encounter (Signed)
Patient returned call to [name]. °

## 2018-03-29 ENCOUNTER — Ambulatory Visit: Payer: BLUE CROSS/BLUE SHIELD | Admitting: Family Medicine

## 2018-03-29 ENCOUNTER — Encounter: Payer: Self-pay | Admitting: Family Medicine

## 2018-03-29 VITALS — BP 120/88 | HR 71 | Temp 98.0°F | Ht 64.75 in | Wt 182.2 lb

## 2018-03-29 DIAGNOSIS — J40 Bronchitis, not specified as acute or chronic: Secondary | ICD-10-CM | POA: Diagnosis not present

## 2018-03-29 DIAGNOSIS — J0100 Acute maxillary sinusitis, unspecified: Secondary | ICD-10-CM

## 2018-03-29 MED ORDER — FLUTICASONE PROPIONATE 50 MCG/ACT NA SUSP: 2.0000 | Freq: Every day | NASAL | 0 refills | Status: DC

## 2018-03-29 MED ORDER — BENZONATATE 200 MG PO CAPS: 200.0000 mg | ORAL_CAPSULE | Freq: Three times a day (TID) | ORAL | 0 refills | Status: DC | PRN

## 2018-03-29 NOTE — Progress Notes (Signed)
Patient: Elizabeth [name] MRN: [id number] DOB: 08/08/1990 PCP: Orland Mustard, MD     Subjective:  Chief Complaint  Patient presents with  . Cough    seen on 10/21.  given antibiotic but told to wait 10 days before taking.     HPI: The patient is a 27 y.o. female who presents today for cough/URI symptoms that are no better from when she saw PA on 10/21. Symptoms started 9 days ago.  She has not had fever/chills, but continues to cough and has lost her voice. She has been coughing and urinating on herself and had some episodes of post tussive emesis. When she saw PA on 10/21 told viral, but given zpack to start after 10 days. Since Monday she continues to have cough, congestion and extreme pain in her sinuses. No ear pain or drainage. Her cough is productive in nature. She has no known sick contacts. She does not have asthma, but she does smoke. She does not do flu shots. She has been taking tussin DM, thera flu night and day. No shortness of breath or wheezing.  Review of Systems  Constitutional: Positive for chills and fever.  HENT: Positive for congestion, postnasal drip and sinus pressure. Negative for ear pain, sinus pain and sore throat.   Respiratory: Positive for cough.   Cardiovascular: Positive for chest pain.       Only has chest pain when excessively coughing  Gastrointestinal: Positive for abdominal pain and nausea.       Some mild belly pain  Musculoskeletal: Negative for back pain and joint swelling.  Neurological: Negative for dizziness and headaches.  Psychiatric/Behavioral: Negative for sleep disturbance.    Allergies Patient is allergic to depakote [divalproex sodium]; penicillins; risperidone and related; trazodone and nefazodone; vistaril [hydroxyzine hcl]; and peanut butter flavor.  Past Medical History Patient  has a past medical history of Anxiety, Bipolar 1 disorder (HCC), Chlamydia infection (2005, 2008), and Recurrent vaginitis.  Surgical History Patient   has a past surgical history that includes right foot surgery; Abscess drainage; Tonsillectomy and adenoidectomy; Heel spur surgery; and Incision and drainage abscess (Left, 03/03/2016).  Family History Pateint's family history includes Alcohol abuse in her paternal grandfather; Cancer in her maternal grandmother; Heart Problems in her paternal grandmother; Hypertension in her father, maternal grandmother, and mother; Thyroid cancer in her mother.  Social History Patient  reports that she has been smoking cigarettes. She has been smoking about 0.50 packs per day. She has never used smokeless tobacco. She reports that she drank about 1.0 standard drinks of alcohol per week. She reports that she has current or past drug history. Drug: Marijuana.    Objective: Vitals:   03/29/18 1105  BP: 120/88  Pulse: 71  Temp: 98 F (36.7 C)  TempSrc: Oral  SpO2: 98%  Weight: 182 lb 3.2 oz (82.6 kg)  Height: 5' 4.75" (1.645 m)    Body mass index is 30.55 kg/m.  Physical Exam  Constitutional: She is oriented to person, place, and time. She appears well-developed and well-nourished.  HENT:  Right Ear: External ear normal.  Left Ear: External ear normal.  Mouth/Throat: No oropharyngeal exudate.  Bilateral impacted cerumen.  Mild cobblestoning. TTP over bilateral maxillary sinuses.   Cardiovascular: Normal rate, regular rhythm and normal heart sounds.  Pulmonary/Chest: Effort normal and breath sounds normal. No respiratory distress. She has no wheezes. She has no rales.  Abdominal: Soft. Bowel sounds are normal.  Lymphadenopathy:    She has cervical adenopathy.  Neurological:  She is alert and oriented to person, place, and time.  Skin: Skin is warm. No rash noted.  Vitals reviewed.      Assessment/plan: 1. Bronchitis Start zpack as symptoms are at 10 day point. Discussed conservative therapy with rest, fluids, honey, cool mist humidifier, robitussin DM and nyquil at night. Has some post nasal  drip as well and want her to start flonase. Discussed cough can last 6 weeks so continue conservative tx. Sending in tessalon pearls prn as well. Precautions given if not getting better/fever/worsening symptoms.   2. Subacute maxillary sinusitis zpack will cover her for this. Start flonase daily.     Return if symptoms worsen or fail to improve.   Orland Mustard, MD [facility]   03/29/2018

## 2018-03-29 NOTE — Patient Instructions (Signed)
Start your zpack today.. Treating you for sinus infection and bronchitis even though this is likely viral.   -honey, cool mist humidifier at night -tessalon pearls up to three times a day for cough -robitussin DM and nyquil at night -flonase daily to help with nasal congestion.   Acute Bronchitis, Adult Acute bronchitis is when air tubes (bronchi) in the lungs suddenly get swollen. The condition can make it hard to breathe. It can also cause these symptoms:  A cough.  Coughing up clear, yellow, or green mucus.  Wheezing.  Chest congestion.  Shortness of breath.  A fever.  Body aches.  Chills.  A sore throat.  Follow these instructions at home: Medicines  Take over-the-counter and prescription medicines only as told by your doctor.  If you were prescribed an antibiotic medicine, take it as told by your doctor. Do not stop taking the antibiotic even if you start to feel better. General instructions  Rest.  Drink enough fluids to keep your pee (urine) clear or pale yellow.  Avoid smoking and secondhand smoke. If you smoke and you need help quitting, ask your doctor. Quitting will help your lungs heal faster.  Use an inhaler, cool mist vaporizer, or humidifier as told by your doctor.  Keep all follow-up visits as told by your doctor. This is important. How is this prevented? To lower your risk of getting this condition again:  Wash your hands often with soap and water. If you cannot use soap and water, use hand sanitizer.  Avoid contact with people who have cold symptoms.  Try not to touch your hands to your mouth, nose, or eyes.  Make sure to get the flu shot every year.  Contact a doctor if:  Your symptoms do not get better in 2 weeks. Get help right away if:  You cough up blood.  You have chest pain.  You have very bad shortness of breath.  You become dehydrated.  You faint (pass out) or keep feeling like you are going to pass out.  You keep  throwing up (vomiting).  You have a very bad headache.  Your fever or chills gets worse. This information is not intended to replace advice given to you by your health care provider. Make sure you discuss any questions you have with your health care provider. Document Released: 11/08/2007 Document Revised: 12/29/2015 Document Reviewed: 11/10/2015 Elsevier Interactive Patient Education  [employer].

## 2018-04-01 ENCOUNTER — Ambulatory Visit: Payer: BLUE CROSS/BLUE SHIELD | Admitting: Physician Assistant

## 2018-04-12 ENCOUNTER — Ambulatory Visit: Payer: BLUE CROSS/BLUE SHIELD | Admitting: Family Medicine

## 2018-04-12 ENCOUNTER — Ambulatory Visit: Payer: BLUE CROSS/BLUE SHIELD | Admitting: Physician Assistant

## 2018-04-12 ENCOUNTER — Encounter: Payer: Self-pay | Admitting: Family Medicine

## 2018-04-12 VITALS — BP 110/70 | HR 71 | Temp 97.9°F | Ht 64.75 in | Wt 178.6 lb

## 2018-04-12 DIAGNOSIS — Z0000 Encounter for general adult medical examination without abnormal findings: Secondary | ICD-10-CM

## 2018-04-12 LAB — COMPREHENSIVE METABOLIC PANEL
ALBUMIN: 4.2 g/dL (ref 3.5–5.2)
ALK PHOS: 45 U/L (ref 39–117)
ALT: 8 U/L (ref 0–35)
AST: 17 U/L (ref 0–37)
BUN: 6 mg/dL (ref 6–23)
CHLORIDE: 109 meq/L (ref 96–112)
CO2: 24 mEq/L (ref 19–32)
Calcium: 9.3 mg/dL (ref 8.4–10.5)
Creatinine, Ser: 0.65 mg/dL (ref 0.40–1.20)
GFR: 138.47 mL/min (ref >60.00)
Glucose, Bld: 92 mg/dL (ref 70–99)
POTASSIUM: 3.8 meq/L (ref 3.5–5.1)
SODIUM: 138 meq/L (ref 135–145)
TOTAL PROTEIN: 7.1 g/dL (ref 6.0–8.3)
Total Bilirubin: 0.7 mg/dL (ref 0.2–1.2)

## 2018-04-12 LAB — CBC WITH DIFFERENTIAL/PLATELET
BASOS PCT: 1.1 % (ref 0.0–3.0)
Basophils Absolute: 0.1 10*3/uL (ref 0.0–0.1)
EOS PCT: 2.1 % (ref 0.0–5.0)
Eosinophils Absolute: 0.1 10*3/uL (ref 0.0–0.7)
HCT: 38.2 % (ref 36.0–46.0)
HEMOGLOBIN: 13.4 g/dL (ref 12.0–15.0)
LYMPHS PCT: 65.8 % — AB (ref 12.0–46.0)
Lymphs Abs: 3.3 10*3/uL (ref 0.7–4.0)
MCHC: 35 g/dL (ref 30.0–36.0)
MCV: 91 fl (ref 78.0–100.0)
Monocytes Absolute: 0.3 10*3/uL (ref 0.1–1.0)
Monocytes Relative: 6.8 % (ref 3.0–12.0)
Neutro Abs: 1.2 10*3/uL — ABNORMAL LOW (ref 1.4–7.7)
Neutrophils Relative %: 24.2 % — ABNORMAL LOW (ref 43.0–77.0)
Platelets: 280 10*3/uL (ref 150.0–400.0)
RBC: 4.2 Mil/uL (ref 3.87–5.11)
RDW: 12.9 % (ref 11.5–15.5)
WBC: 5.1 10*3/uL (ref 4.0–10.5)

## 2018-04-12 LAB — LIPID PANEL
CHOL/HDL RATIO: 4
CHOLESTEROL: 144 mg/dL (ref 0–200)
HDL: 35.7 mg/dL — ABNORMAL LOW (ref >39.00)
LDL CALC: 99 mg/dL (ref 0–99)
NONHDL: 108.11
Triglycerides: 48 mg/dL (ref 0.0–149.0)
VLDL: 9.6 mg/dL (ref 0.0–40.0)

## 2018-04-12 LAB — VITAMIN D 25 HYDROXY (VIT D DEFICIENCY, FRACTURES): VITD: 29.74 ng/mL — ABNORMAL LOW (ref 30.00–100.00)

## 2018-04-12 LAB — TSH: TSH: 0.51 u[IU]/mL (ref 0.35–4.50)

## 2018-04-12 NOTE — Progress Notes (Signed)
Patient: Elizabeth [name] MRN: [id number] DOB: 1991/03/10 PCP: Orland Mustard, MD     Subjective:  Chief Complaint  Patient presents with  . Annual Exam    HPI: The patient is a 27 y.o. female who presents today for annual exam. She denies any changes to past medical history. There have been no recent hospitalizations. They are following a well balanced diet and exercise plan. Weight has been stable. No complaints today.   Immunization History  Administered Date(s) Administered  . HPV Quadrivalent 07/23/2006, 11/08/2006  . Influenza,inj,quad, With Preservative 03/17/2014  . Pneumococcal Conjugate-13 03/17/2014  . Tdap 11/21/2015   Mammogram:  Pap smear: 01/2018 wnl.  Tdap: 2017 Gc/c: done in 01/2018 Hiv: screen   Review of Systems  Constitutional: Negative for chills, fatigue and fever.  HENT: Negative for dental problem, ear pain, hearing loss and trouble swallowing.   Eyes: Negative for visual disturbance.  Respiratory: Negative for cough, chest tightness and shortness of breath.   Cardiovascular: Negative for chest pain, palpitations and leg swelling.  Gastrointestinal: Negative for abdominal pain, blood in stool, diarrhea and nausea.  Endocrine: Negative for cold intolerance, polydipsia, polyphagia and polyuria.  Genitourinary: Negative for dysuria and hematuria.  Musculoskeletal: Negative for arthralgias.  Skin: Negative for rash.  Neurological: Negative for dizziness and headaches.  Psychiatric/Behavioral: Negative for dysphoric mood and sleep disturbance. The patient is not nervous/anxious.     Allergies Patient is allergic to depakote [divalproex sodium]; penicillins; risperidone and related; trazodone and nefazodone; vistaril [hydroxyzine hcl]; and peanut butter flavor.  Past Medical History Patient  has a past medical history of Anxiety, Bipolar 1 disorder (HCC), Chlamydia infection (2005, 2008), and Recurrent vaginitis.  Surgical History Patient  has a past  surgical history that includes right foot surgery; Abscess drainage; Tonsillectomy and adenoidectomy; Heel spur surgery; and Incision and drainage abscess (Left, 03/03/2016).  Family History Pateint's family history includes Alcohol abuse in her paternal grandfather; Cancer in her maternal grandmother; Heart Problems in her paternal grandmother; Hypertension in her father, maternal grandmother, and mother; Thyroid cancer in her mother.  Social History Patient  reports that she has been smoking cigarettes. She has been smoking about 0.50 packs per day. She has never used smokeless tobacco. She reports that she drank about 1.0 standard drinks of alcohol per week. She reports that she has current or past drug history. Drug: Marijuana.    Objective: Vitals:   04/12/18 1049  BP: 110/70  Pulse: 71  Temp: 97.9 F (36.6 C)  TempSrc: Oral  SpO2: 100%  Weight: 178 lb 9.6 oz (81 kg)  Height: 5' 4.75" (1.645 m)    Body mass index is 29.95 kg/m.  Physical Exam  Constitutional: She is oriented to person, place, and time. She appears well-developed and well-nourished.  HENT:  Right Ear: External ear normal.  Left Ear: External ear normal.  Mouth/Throat: Oropharynx is clear and moist.  Eyes: Pupils are equal, round, and reactive to light. Conjunctivae and EOM are normal.  Neck: Normal range of motion. Neck supple. No thyromegaly present.  Cardiovascular: Normal rate, regular rhythm, normal heart sounds and intact distal pulses.  No murmur heard. Pulmonary/Chest: Effort normal and breath sounds normal.  Abdominal: Soft. Bowel sounds are normal. She exhibits no distension. There is no tenderness.  Lymphadenopathy:    She has no cervical adenopathy.  Neurological: She is alert and oriented to person, place, and time. She displays normal reflexes. No cranial nerve deficit. Coordination normal.  Skin: Skin is warm and dry.  No rash noted.  Psychiatric: She has a normal mood and affect. Her behavior  is normal.  Vitals reviewed.        Office Visit from 04/12/2018 in [facility]  PHQ-9 Total Score  3      Assessment/plan: 1. Annual physical exam utd on health maintenance. Declines flu shot today. Will get routine lab work. Working on relationship and plans on going to counseling. No signs of depression on exam/phq9 today. She is to let me know if she feels like this could be an issue. F/u with gyn for chronic BV.  Patient counseling [x]    Nutrition: Stressed importance of moderation in sodium/caffeine intake, saturated fat and cholesterol, caloric balance, sufficient intake of fresh fruits, vegetables, fiber, calcium, iron, and 1 mg of folate supplement per day (for females capable of pregnancy).  [x]    Stressed the importance of regular exercise.   [x]    Substance Abuse: Discussed cessation/primary prevention of tobacco, alcohol, or other drug use; driving or other dangerous activities under the influence; availability of treatment for abuse.   [x]    Injury prevention: Discussed safety belts, safety helmets, smoke detector, smoking near bedding or upholstery.   [x]    Sexuality: Discussed sexually transmitted diseases, partner selection, use of condoms, avoidance of unintended pregnancy  and contraceptive alternatives.  [x]    Dental health: Discussed importance of regular tooth brushing, flossing, and dental visits.  [x]    Health maintenance and immunizations reviewed. Please refer to Health maintenance section.    - Comprehensive metabolic panel - CBC with Differential/Platelet - Lipid panel - TSH - VITAMIN D 25 Hydroxy (Vit-D Deficiency, Fractures)    Return if symptoms worsen or fail to improve.       Orland Mustard, MD [facility]  04/12/2018

## 2018-04-15 ENCOUNTER — Other Ambulatory Visit: Payer: Self-pay | Admitting: Family Medicine

## 2018-04-15 DIAGNOSIS — D72820 Lymphocytosis (symptomatic): Secondary | ICD-10-CM

## 2018-06-01 ENCOUNTER — Inpatient Hospital Stay
Admit: 2018-06-01 | Discharge: 2018-06-01 | Disposition: A | Discharge status: Home / Self Care | Payer: MEDICAID | Attending: Emergency Medicine

## 2018-06-01 ENCOUNTER — Emergency Department: Admit: 2018-06-01 | Payer: MEDICAID | Primary: Family Medicine

## 2018-06-01 DIAGNOSIS — J069 Acute upper respiratory infection, unspecified: Secondary | ICD-10-CM

## 2018-06-01 LAB — URINALYSIS W/ RFLX MICROSCOPIC
Bilirubin, Urine: NEGATIVE
Bilirubin: NEGATIVE
Glucose, Ur: NEGATIVE mg/dL
Glucose: NEGATIVE mg/dL
Ketone: 15 mg/dL — AB
Ketones, Urine: 15 mg/dL — AB
Leukocyte Esterase, Urine: NEGATIVE
Leukocyte Esterase: NEGATIVE
Nitrite, Urine: NEGATIVE
Nitrites: NEGATIVE
Specific Gravity, UA: >1.03 NA — ABNORMAL HIGH (ref 1.005–1.030)
Specific gravity: >1.03 — ABNORMAL HIGH (ref 1.005–1.030)
Urobilinogen, UA, POCT: 1 EU/dL (ref 0.2–1.0)
Urobilinogen: 1 EU/dL (ref 0.2–1.0)
pH (UA): 5 (ref 5.0–8.0)
pH, UA: 5 (ref 5.0–8.0)

## 2018-06-01 LAB — URINE MICROSCOPIC ONLY
RBC, UA: 0 /hpf (ref 0–5)
RBC: 0 /hpf (ref 0–5)
WBC, UA: 0 /hpf (ref 0–4)
WBC: 0 /hpf (ref 0–4)

## 2018-06-01 LAB — HCG URINE, QL
HCG urine, QL: NEGATIVE
Pregnancy Test(Urn): NEGATIVE

## 2018-06-01 MED ORDER — AZITHROMYCIN 250 MG TAB
250 mg | ORAL_TABLET | ORAL | 0 refills | Status: AC
Start: 2018-06-01 — End: 2018-06-06

## 2018-06-01 MED ORDER — ACETAMINOPHEN 500 MG TAB
500 mg | ORAL | Status: AC
Start: 2018-06-01 — End: 2018-06-01
  Administered 2018-06-01: 23:00:00 via ORAL

## 2018-06-01 MED ORDER — ONDANSETRON 4 MG TAB, RAPID DISSOLVE
4 mg | ORAL | Status: AC
Start: 2018-06-01 — End: 2018-06-01
  Administered 2018-06-01: 23:00:00 via ORAL

## 2018-06-01 MED ORDER — BENZONATATE 100 MG CAP
100 mg | ORAL_CAPSULE | Freq: Three times a day (TID) | ORAL | 0 refills | Status: AC | PRN
Start: 2018-06-01 — End: 2018-06-08

## 2018-06-01 MED FILL — ACETAMINOPHEN 500 MG TAB: 500 mg | ORAL | Qty: 2

## 2018-06-01 MED FILL — ONDANSETRON 4 MG TAB, RAPID DISSOLVE: 4 mg | ORAL | Qty: 1

## 2018-06-01 NOTE — ED Notes (Signed)
Pt up to bathroom for clean catch urine sample

## 2018-06-01 NOTE — ED Provider Notes (Signed)
EMERGENCY DEPARTMENT HISTORY AND PHYSICAL EXAM    4:18 PM      Date: 06/01/2018  Patient Name: Janet Pugh    History of Presenting Illness     Chief Complaint   Patient presents with   ??? Cough   ??? Generalized Body Aches         History Provided By: Patient    Additional History (Context): Janet Pugh is a 27 y.o. female with hx of eczema who presents with complaint of productive cough associated with myalgias and subjective fever x1 week.  Patient notes 2 episodes of posttussive emesis.  Patient notes she had similar symptoms about 6 weeks ago that resolved without intervention.  Denies chest pain, shortness of breath, abdominal pain, diarrhea. Notes she has tried over-the-counter medication for symptoms without relief. +smoker. LMP: 12/13      PCP: Other, Phys, MD    Current Outpatient Medications   Medication Sig Dispense Refill   ??? benzonatate (TESSALON PERLES) 100 mg capsule Take 1 Cap by mouth three (3) times daily as needed for Cough for up to 7 days. 21 Cap 0   ??? azithromycin (ZITHROMAX Z-PAK) 250 mg tablet Take medication as prescribed 6 Tab 0   ??? naphazoline-pheniramine (NAPHCON-A) 0.025-0.3 % ophthalmic solution Administer 2 Drops to both eyes three (3) times daily as needed for Pain. 15 mL 0       Past History     Past Medical History:  Past Medical History:   Diagnosis Date   ??? Eczema        Past Surgical History:  Past Surgical History:   Procedure Laterality Date   ??? HX ANKLE FRACTURE TX      with reconstruction   ??? HX CESAREAN SECTION  2015       Family History:  History reviewed. No pertinent family history.    Social History:  Social History     Tobacco Use   ??? Smoking status: Current Every Day Smoker     Packs/day: 0.25   ??? Smokeless tobacco: Never Used   Substance Use Topics   ??? Alcohol use: No   ??? Drug use: No       Allergies:  No Known Allergies      Review of Systems       Review of Systems   Constitutional: Positive for fever. Negative for chills.   Respiratory: Positive for cough.  Negative for shortness of breath.    Cardiovascular: Negative for chest pain.   Gastrointestinal: Negative for abdominal pain, nausea and vomiting.   Musculoskeletal: Positive for myalgias.   Skin: Negative for rash.   Neurological: Negative for weakness.   All other systems reviewed and are negative.        Physical Exam     Visit Vitals  BP (!) 154/113   Pulse 91   Temp 98.9 ??F (37.2 ??C)   Resp 20   Ht 5\' 4"  (1.626 m)   Wt 106.6 kg (235 lb)   LMP 05/17/2018   SpO2 97%   BMI 40.34 kg/m??         Physical Exam  Vitals signs and nursing note reviewed.   Constitutional:       General: She is not in acute distress.     Appearance: Normal appearance. She is well-developed. She is not ill-appearing, toxic-appearing or diaphoretic.   HENT:      Head: Normocephalic and atraumatic.      Right Ear: Tympanic membrane and ear  canal normal.      Left Ear: Tympanic membrane and ear canal normal.      Nose: Nose normal.      Mouth/Throat:      Mouth: Mucous membranes are moist.   Neck:      Musculoskeletal: Normal range of motion and neck supple. No neck rigidity.   Cardiovascular:      Rate and Rhythm: Normal rate and regular rhythm.      Heart sounds: Normal heart sounds. No murmur. No friction rub. No gallop.    Pulmonary:      Effort: Pulmonary effort is normal. No respiratory distress.      Breath sounds: Normal breath sounds. No stridor. No wheezing, rhonchi or rales.   Abdominal:      General: Abdomen is flat. There is no distension.      Tenderness: There is no tenderness. There is no guarding.   Musculoskeletal: Normal range of motion.   Lymphadenopathy:      Cervical: No cervical adenopathy.   Skin:     General: Skin is warm.      Findings: No rash.   Neurological:      Mental Status: She is alert.           Diagnostic Study Results     Labs -  Recent Results (from the past 12 hour(s))   URINALYSIS W/ RFLX MICROSCOPIC    Collection Time: 06/01/18  4:45 PM   Result Value Ref Range    Color DARK YELLOW      Appearance  CLOUDY      Specific gravity >1.030 (H) 1.005 - 1.030    pH (UA) 5.0 5.0 - 8.0      Protein TRACE (A) NEG mg/dL    Glucose NEGATIVE  NEG mg/dL    Ketone 15 (A) NEG mg/dL    Bilirubin NEGATIVE  NEG      Blood MODERATE (A) NEG      Urobilinogen 1.0 0.2 - 1.0 EU/dL    Nitrites NEGATIVE  NEG      Leukocyte Esterase NEGATIVE  NEG     HCG URINE, QL    Collection Time: 06/01/18  4:45 PM   Result Value Ref Range    HCG urine, QL NEGATIVE  NEG     URINE MICROSCOPIC ONLY    Collection Time: 06/01/18  4:45 PM   Result Value Ref Range    WBC 0 to 3 0 - 4 /hpf    RBC 0 to 3 0 - 5 /hpf    Epithelial cells 3+ 0 - 5 /lpf    Bacteria 1+ (A) NEG /hpf    Mucus 1+ (A) NEG /lpf       Radiologic Studies -   XR CHEST PA LAT    (Results Pending)   no acute process       Medical Decision Making   I am the first provider for this patient.    I reviewed the vital signs, available nursing notes, past medical history, past surgical history, family history and social history.    Vital Signs-Reviewed the patient's vital signs.    Records Reviewed: Nursing Notes and Old Medical Records (Time of Review: 4:18 PM)    ED Course: Progress Notes, Reevaluation, and Consults:  4:18 PM  Reviewed results with patient. Discussed need for close outpatient follow-up this week for reassessment. Discussed strict return precautions, including fever, vomiting, or any other medical concerns.    One or more blood pressure readings  were noted elevated during the patient's presentation in the emergency department today.  This abnormal reading has been cited in the patients' diagnosis, and they have been encouraged to follow up with their primary care physician, or referred to a consultant for further evaluation and treatment.    Provider Notes (Medical Decision Making): 27 year old female who presents to the ED due to cough, subjective fever, myalgias.  Afebrile, nontoxic-appearing, looks well.  No evidence of otitis media/externa, strep pharyngitis, pneumonia.  Out  of the window for influenza testing and treatment.  Tolerating p.o., no distress.  Stable for discharge with close outpatient follow-up for further assessment.      Diagnosis     Clinical Impression:   1. Acute upper respiratory infection    2. Elevated blood pressure reading        Disposition: home     Follow-up Information     Follow up With Specialties Details Why Contact Info    [facility] EMERGENCY DEPT Emergency Medicine  If symptoms worsen [address] [place] [address]  [phone]    [facility]    [address] [place]  [address]  [place] [address]  [phone]           Patient's Medications   Start Taking    AZITHROMYCIN (ZITHROMAX Z-PAK) 250 MG TABLET    Take medication as prescribed    BENZONATATE (TESSALON PERLES) 100 MG CAPSULE    Take 1 Cap by mouth three (3) times daily as needed for Cough for up to 7 days.   Continue Taking    NAPHAZOLINE-PHENIRAMINE (NAPHCON-A) 0.025-0.3 % OPHTHALMIC SOLUTION    Administer 2 Drops to both eyes three (3) times daily as needed for Pain.   These Medications have changed    No medications on file   Stop Taking    No medications on file       Dictation disclaimer:  Please note that this dictation was completed with Dragon, the computer voice recognition software.  Quite often unanticipated grammatical, syntax, homophones, and other interpretive errors are inadvertently transcribed by the computer software.  Please disregard these errors.  Please excuse any errors that have escaped final proofreading.

## 2018-06-01 NOTE — ED Notes (Signed)
Patient armband removed and shreddedI have reviewed discharge instructions with the patient.  The patient verbalized understanding. rx x 2 and work note given

## 2018-06-01 NOTE — ED Notes (Signed)
Pt states cough for 6 weeks with fever went away and returned on Christmas eve. Vomiting times 2 today poor appetite. Upper back hurts from coughing

## 2018-06-01 NOTE — ED Triage Notes (Signed)
Pt states cough for 6 weeks with fever went away and returned on Christmas eve. Vomiting times 2 today poor appetite. Upper back hurts from coughing

## 2018-06-01 NOTE — ED Provider Notes (Signed)
EMERGENCY DEPARTMENT HISTORY AND PHYSICAL EXAM    4:18 PM      Date: 06/01/2018  Patient Name: Janet Pugh    History of Presenting Illness     Chief Complaint   Patient presents with   ??? Cough   ??? Generalized Body Aches         History Provided By: Patient    Additional History (Context): Janet Pugh is a 27 y.o. female with hx of eczema who presents with complaint of productive cough associated with myalgias and subjective fever x1 week.  Patient notes 2 episodes of posttussive emesis.  Patient notes she had similar symptoms about 6 weeks ago that resolved without intervention.  Denies chest pain, shortness of breath, abdominal pain, diarrhea. Notes she has tried over-the-counter medication for symptoms without relief. +smoker. LMP: 12/13      PCP: Other, Phys, MD    Current Outpatient Medications   Medication Sig Dispense Refill   ??? benzonatate (TESSALON PERLES) 100 mg capsule Take 1 Cap by mouth three (3) times daily as needed for Cough for up to 7 days. 21 Cap 0   ??? azithromycin (ZITHROMAX Z-PAK) 250 mg tablet Take medication as prescribed 6 Tab 0   ??? naphazoline-pheniramine (NAPHCON-A) 0.025-0.3 % ophthalmic solution Administer 2 Drops to both eyes three (3) times daily as needed for Pain. 15 mL 0       Past History     Past Medical History:  Past Medical History:   Diagnosis Date   ??? Eczema        Past Surgical History:  Past Surgical History:   Procedure Laterality Date   ??? HX ANKLE FRACTURE TX      with reconstruction   ??? HX CESAREAN SECTION  2015       Family History:  History reviewed. No pertinent family history.    Social History:  Social History     Tobacco Use   ??? Smoking status: Current Every Day Smoker     Packs/day: 0.25   ??? Smokeless tobacco: Never Used   Substance Use Topics   ??? Alcohol use: No   ??? Drug use: No       Allergies:  No Known Allergies      Review of Systems       Review of Systems   Constitutional: Positive for fever. Negative for chills.    Respiratory: Positive for cough. Negative for shortness of breath.    Cardiovascular: Negative for chest pain.   Gastrointestinal: Negative for abdominal pain, nausea and vomiting.   Musculoskeletal: Positive for myalgias.   Skin: Negative for rash.   Neurological: Negative for weakness.   All other systems reviewed and are negative.        Physical Exam     Visit Vitals  BP (!) 154/113   Pulse 91   Temp 98.9 ??F (37.2 ??C)   Resp 20   Ht 5\' 4"  (1.626 m)   Wt 106.6 kg (235 lb)   LMP 05/17/2018   SpO2 97%   BMI 40.34 kg/m??         Physical Exam  Vitals signs and nursing note reviewed.   Constitutional:       General: She is not in acute distress.     Appearance: Normal appearance. She is well-developed. She is not ill-appearing, toxic-appearing or diaphoretic.   HENT:      Head: Normocephalic and atraumatic.      Right Ear: Tympanic membrane and ear  canal normal.      Left Ear: Tympanic membrane and ear canal normal.      Nose: Nose normal.      Mouth/Throat:      Mouth: Mucous membranes are moist.   Neck:      Musculoskeletal: Normal range of motion and neck supple. No neck rigidity.   Cardiovascular:      Rate and Rhythm: Normal rate and regular rhythm.      Heart sounds: Normal heart sounds. No murmur. No friction rub. No gallop.    Pulmonary:      Effort: Pulmonary effort is normal. No respiratory distress.      Breath sounds: Normal breath sounds. No stridor. No wheezing, rhonchi or rales.   Abdominal:      General: Abdomen is flat. There is no distension.      Tenderness: There is no tenderness. There is no guarding.   Musculoskeletal: Normal range of motion.   Lymphadenopathy:      Cervical: No cervical adenopathy.   Skin:     General: Skin is warm.      Findings: No rash.   Neurological:      Mental Status: She is alert.           Diagnostic Study Results     Labs -  Recent Results (from the past 12 hour(s))   URINALYSIS W/ RFLX MICROSCOPIC    Collection Time: 06/01/18  4:45 PM   Result Value Ref Range     Color DARK YELLOW      Appearance CLOUDY      Specific gravity >1.030 (H) 1.005 - 1.030    pH (UA) 5.0 5.0 - 8.0      Protein TRACE (A) NEG mg/dL    Glucose NEGATIVE  NEG mg/dL    Ketone 15 (A) NEG mg/dL    Bilirubin NEGATIVE  NEG      Blood MODERATE (A) NEG      Urobilinogen 1.0 0.2 - 1.0 EU/dL    Nitrites NEGATIVE  NEG      Leukocyte Esterase NEGATIVE  NEG     HCG URINE, QL    Collection Time: 06/01/18  4:45 PM   Result Value Ref Range    HCG urine, QL NEGATIVE  NEG     URINE MICROSCOPIC ONLY    Collection Time: 06/01/18  4:45 PM   Result Value Ref Range    WBC 0 to 3 0 - 4 /hpf    RBC 0 to 3 0 - 5 /hpf    Epithelial cells 3+ 0 - 5 /lpf    Bacteria 1+ (A) NEG /hpf    Mucus 1+ (A) NEG /lpf       Radiologic Studies -   XR CHEST PA LAT    (Results Pending)   no acute process       Medical Decision Making   I am the first provider for this patient.    I reviewed the vital signs, available nursing notes, past medical history, past surgical history, family history and social history.    Vital Signs-Reviewed the patient's vital signs.    Records Reviewed: Nursing Notes and Old Medical Records (Time of Review: 4:18 PM)    ED Course: Progress Notes, Reevaluation, and Consults:  4:18 PM  Reviewed results with patient. Discussed need for close outpatient follow-up this week for reassessment. Discussed strict return precautions, including fever, vomiting, or any other medical concerns.    One or more blood pressure readings  were noted elevated during the patient's presentation in the emergency department today.  This abnormal reading has been cited in the patients' diagnosis, and they have been encouraged to follow up with their primary care physician, or referred to a consultant for further evaluation and treatment.    Provider Notes (Medical Decision Making): 27 year old female who presents to the ED due to cough, subjective fever, myalgias.  Afebrile, nontoxic-appearing, looks well.  No evidence of otitis media/externa,  strep pharyngitis, pneumonia.  Out of the window for influenza testing and treatment.  Tolerating p.o., no distress.  Stable for discharge with close outpatient follow-up for further assessment.      Diagnosis     Clinical Impression:   1. Acute upper respiratory infection    2. Elevated blood pressure reading        Disposition: home     Follow-up Information     Follow up With Specialties Details Why Contact Info    [facility] EM[facility] Emergency Medicine  If symptoms worsen [address]  [place] [id number]  [phone]    [facility]    [address]  [address]  [place] [id number]  [phone]           Patient's Medications   Start Taking    AZITHROMYCIN (ZITHROMAX Z-PAK) 250 MG TABLET    Take medication as prescribed    BENZONATATE (TESSALON PERLES) 100 MG CAPSULE    Take 1 Cap by mouth three (3) times daily as needed for Cough for up to 7 days.   Continue Taking    NAPHAZOLINE-PHENIRAMINE (NAPHCON-A) 0.025-0.3 % OPHTHALMIC SOLUTION    Administer 2 Drops to both eyes three (3) times daily as needed for Pain.   These Medications have changed    No medications on file   Stop Taking    No medications on file       Dictation disclaimer:  Please note that this dictation was completed with Dragon, the computer voice recognition software.  Quite often unanticipated grammatical, syntax, homophones, and other interpretive errors are inadvertently transcribed by the computer software.  Please disregard these errors.  Please excuse any errors that have escaped final proofreading.

## 2018-06-03 LAB — CULTURE, URINE
Culture result:: 80000
Culture result:: <10000 — AB
Culture: 80000

## 2018-10-07 ENCOUNTER — Ambulatory Visit: Payer: Self-pay | Admitting: Family Medicine

## 2018-10-07 ENCOUNTER — Encounter: Payer: Self-pay | Admitting: Family Medicine

## 2018-10-07 VITALS — BP 102/74 | HR 84 | Temp 98.4°F | Ht 64.75 in | Wt 176.8 lb

## 2018-10-07 DIAGNOSIS — Z3A01 Less than 8 weeks gestation of pregnancy: Secondary | ICD-10-CM

## 2018-10-07 DIAGNOSIS — R112 Nausea with vomiting, unspecified: Secondary | ICD-10-CM | POA: Diagnosis not present

## 2018-10-07 NOTE — Patient Instructions (Signed)

## 2018-10-07 NOTE — Progress Notes (Signed)
Patient: Elizabeth [name] MRN: [id number] DOB: 1990/09/11 PCP: Orland Mustard, MD     Subjective:  Chief Complaint  Patient presents with  . positive pregnancy test    HPI: The patient is a 28 y.o. female who presents today for pregnancy. Her LMP was 08/25/2018. She would be [redacted]w[redacted]d. She started a PNV 2 weeks ago. This was a planned pregnancy. She is no longer with the baby's father and has no desire to be with him due to him being alcoholic. She does not drink or smoke. Had one episode of drinking/MJ before she knew she was pregnant, but has stopped all of this. Very excited about her pregnancy. +home pregnancy test.  -She has no cat. She has a lot of questions regarding some pills/belly blam. She does have some nausea and some morning sickness. Her family is supportive. EDD: 06/01/2019.    Review of Systems  Eyes: Negative for visual disturbance.  Respiratory: Negative for shortness of breath.   Cardiovascular: Negative for chest pain.  Gastrointestinal: Positive for nausea and vomiting.  Genitourinary: Negative for pelvic pain, vaginal bleeding and vaginal pain.  Neurological: Negative for dizziness and headaches.    Allergies Patient is allergic to depakote [divalproex sodium]; penicillins; risperidone and related; trazodone and nefazodone; vistaril [hydroxyzine hcl]; and peanut butter flavor.  Past Medical History Patient  has a past medical history of Anxiety, Bipolar 1 disorder (HCC), Chlamydia infection (2005, 2008), and Recurrent vaginitis.  Surgical History Patient  has a past surgical history that includes right foot surgery; Abscess drainage; Tonsillectomy and adenoidectomy; Heel spur surgery; and Incision and drainage abscess (Left, 03/03/2016).  Family History Pateint's family history includes Alcohol abuse in her paternal grandfather; Cancer in her maternal grandmother; Heart Problems in her paternal grandmother; Hypertension in her father, maternal grandmother, and  mother; Thyroid cancer in her mother.  Social History Patient  reports that she has been smoking cigarettes. She has been smoking about 0.50 packs per day. She has never used smokeless tobacco. She reports previous alcohol use of about 1.0 standard drinks of alcohol per week. She reports current drug use. Drug: Marijuana.    Objective: Vitals:   10/07/18 1344  BP: 102/74  Pulse: 84  Temp: 98.4 F (36.9 C)  TempSrc: Oral  SpO2: 98%  Weight: 176 lb 12.8 oz (80.2 kg)  Height: 5' 4.75" (1.645 m)    Body mass index is 29.65 kg/m.  Physical Exam Vitals signs reviewed.  Constitutional:      Appearance: Normal appearance.  Cardiovascular:     Rate and Rhythm: Normal rate and regular rhythm.     Heart sounds: Normal heart sounds.  Pulmonary:     Effort: Pulmonary effort is normal.     Breath sounds: Normal breath sounds.  Abdominal:     General: Abdomen is flat. Bowel sounds are normal.  Neurological:     General: No focal deficit present.     Mental Status: She is alert and oriented to person, place, and time.  Psychiatric:        Mood and Affect: Mood normal.        Behavior: Behavior normal.        Assessment/plan: 1. Less than [redacted] weeks gestation of pregnancy [redacted]w[redacted]d by her LMP. Her urine test was positive at home and will do qualitative today. Already on PNV and answered all of her questions and medication safety in pregnancy. Discussed getting appointment with OB/gyn.  - hCG, serum, qualitative   Return if symptoms worsen or  fail to improve.   Orland MustardAllison Peggy Monk, MD [facility]   10/07/2018

## 2018-10-08 LAB — HCG, SERUM, QUALITATIVE: Preg, Serum: POSITIVE — AB

## 2018-10-09 ENCOUNTER — Other Ambulatory Visit: Payer: Self-pay | Admitting: *Deleted

## 2018-10-09 ENCOUNTER — Encounter: Payer: Self-pay | Admitting: Family Medicine

## 2018-10-09 ENCOUNTER — Telehealth: Payer: Self-pay | Admitting: Obstetrics and Gynecology

## 2018-10-09 DIAGNOSIS — Z3201 Encounter for pregnancy test, result positive: Secondary | ICD-10-CM

## 2018-10-09 DIAGNOSIS — Z8619 Personal history of other infectious and parasitic diseases: Secondary | ICD-10-CM

## 2018-10-09 NOTE — Telephone Encounter (Signed)
Left message to call Graysyn Bache, RN at [facility] [phone].   

## 2018-10-09 NOTE — Telephone Encounter (Signed)
Can you check with the lab if there is any way they can run a quantitative BhcG on her blood work from 2 days ago? She needs to come for a BhcG today.

## 2018-10-09 NOTE — Telephone Encounter (Signed)
Patient just found out she is 6 weeks and 3 days pregnant. Would like to schedule viability ultrasound or referral to ob/gyn.

## 2018-10-09 NOTE — Telephone Encounter (Signed)
Left message to call Yerania Chamorro, RN at [facility] [phone].   

## 2018-10-09 NOTE — Telephone Encounter (Signed)
Patient returning Jill's call.  °

## 2018-10-09 NOTE — Telephone Encounter (Signed)
Left message to call Adoria Kawamoto, RN at [facility] [phone].   

## 2018-10-09 NOTE — Telephone Encounter (Signed)
Spoke with patient. Patient declines lab appt today due to her work schedule. Lab appt scheduled for 5/7 at 10am. ER/MAU precautions reviewed for pain or vaginal bleeding. Patient verbalizes understanding and is agreeable.   Routing to provider for final review. Patient is agreeable to disposition. Will close encounter.

## 2018-10-09 NOTE — Telephone Encounter (Signed)
Spoke with patient. LMP 08/25/18, seen at PCP on 10/07/18, positive hcg serum. Hx of chlamydia. Denies vaginal bleeding or pain. Was advised to f/u GYN for PUS or OBGYN. Advised I will review with covering provider and return call, patient is agreeable.   Dr. Oscar La -please advise.   Cc:  Dr. Edward Jolly

## 2018-10-09 NOTE — Telephone Encounter (Signed)
Patent is returning a call to [facility].

## 2018-10-10 ENCOUNTER — Other Ambulatory Visit (INDEPENDENT_AMBULATORY_CARE_PROVIDER_SITE_OTHER): Payer: Medicaid Other

## 2018-10-10 ENCOUNTER — Telehealth: Payer: Self-pay | Admitting: Obstetrics and Gynecology

## 2018-10-10 ENCOUNTER — Encounter (HOSPITAL_COMMUNITY): Payer: Self-pay | Admitting: *Deleted

## 2018-10-10 ENCOUNTER — Inpatient Hospital Stay (HOSPITAL_COMMUNITY): Payer: Medicaid Other

## 2018-10-10 ENCOUNTER — Other Ambulatory Visit: Payer: Self-pay

## 2018-10-10 ENCOUNTER — Inpatient Hospital Stay (HOSPITAL_COMMUNITY)
Admission: AD | Admit: 2018-10-10 | Discharge: 2018-10-10 | Disposition: A | Discharge status: Home / Self Care | Payer: Medicaid Other | Attending: Obstetrics & Gynecology | Admitting: Obstetrics & Gynecology

## 2018-10-10 DIAGNOSIS — Z8619 Personal history of other infectious and parasitic diseases: Secondary | ICD-10-CM

## 2018-10-10 DIAGNOSIS — F17210 Nicotine dependence, cigarettes, uncomplicated: Secondary | ICD-10-CM | POA: Insufficient documentation

## 2018-10-10 DIAGNOSIS — O99331 Smoking (tobacco) complicating pregnancy, first trimester: Secondary | ICD-10-CM | POA: Insufficient documentation

## 2018-10-10 DIAGNOSIS — Z3A01 Less than 8 weeks gestation of pregnancy: Secondary | ICD-10-CM | POA: Insufficient documentation

## 2018-10-10 DIAGNOSIS — Z3201 Encounter for pregnancy test, result positive: Secondary | ICD-10-CM

## 2018-10-10 DIAGNOSIS — Z3491 Encounter for supervision of normal pregnancy, unspecified, first trimester: Secondary | ICD-10-CM

## 2018-10-10 LAB — BETA HCG QUANT (REF LAB): hCG Quant: 28065 m[IU]/mL

## 2018-10-10 NOTE — MAU Note (Signed)
Pt sent from Dr Salli Quarry office for U/S. Denies any pain or vaginal bleeding

## 2018-10-10 NOTE — Telephone Encounter (Signed)
Routing to Dr. Oscar La to review STAT beta Hcg

## 2018-10-10 NOTE — Discharge Instructions (Signed)
First Trimester of Pregnancy °The first trimester of pregnancy is from week 1 until the end of week 13 (months 1 through 3). A week after a sperm fertilizes an egg, the egg will implant on the wall of the uterus. This embryo will begin to develop into a baby. Genes from you and your partner will form the baby. The female genes will determine whether the baby will be a boy or a girl. At 6-8 weeks, the eyes and face will be formed, and the heartbeat can be seen on ultrasound. At the end of 12 weeks, all the baby's organs will be formed. °Now that you are pregnant, you will want to do everything you can to have a healthy baby. Two of the most important things are to get good prenatal care and to follow your health care provider's instructions. Prenatal care is all the medical care you receive before the baby's birth. This care will help prevent, find, and treat any problems during the pregnancy and childbirth. °Body changes during your first trimester °Your body goes through many changes during pregnancy. The changes vary from woman to woman. °· You may gain or lose a couple of pounds at first. °· You may feel sick to your stomach (nauseous) and you may throw up (vomit). If the vomiting is uncontrollable, call your health care provider. °· You may tire easily. °· You may develop headaches that can be relieved by medicines. All medicines should be approved by your health care provider. °· You may urinate more often. Painful urination may mean you have a bladder infection. °· You may develop heartburn as a result of your pregnancy. °· You may develop constipation because certain hormones are causing the muscles that push stool through your intestines to slow down. °· You may develop hemorrhoids or swollen veins (varicose veins). °· Your breasts may begin to grow larger and become tender. Your nipples may stick out more, and the tissue that surrounds them (areola) may become darker. °· Your gums may bleed and may be  sensitive to brushing and flossing. °· Dark spots or blotches (chloasma, mask of pregnancy) may develop on your face. This will likely fade after the baby is born. °· Your menstrual periods will stop. °· You may have a loss of appetite. °· You may develop cravings for certain kinds of food. °· You may have changes in your emotions from day to day, such as being excited to be pregnant or being concerned that something may go wrong with the pregnancy and baby. °· You may have more vivid and strange dreams. °· You may have changes in your hair. These can include thickening of your hair, rapid growth, and changes in texture. Some women also have hair loss during or after pregnancy, or hair that feels dry or thin. Your hair will most likely return to normal after your baby is born. °What to expect at prenatal visits °During a routine prenatal visit: °· You will be weighed to make sure you and the baby are growing normally. °· Your blood pressure will be taken. °· Your abdomen will be measured to track your baby's growth. °· The fetal heartbeat will be listened to between weeks 10 and 14 of your pregnancy. °· Test results from any previous visits will be discussed. °Your health care provider may ask you: °· How you are feeling. °· If you are feeling the baby move. °· If you have had any abnormal symptoms, such as leaking fluid, bleeding, severe headaches, or abdominal   cramping. °· If you are using any tobacco products, including cigarettes, chewing tobacco, and electronic cigarettes. °· If you have any questions. °Other tests that may be performed during your first trimester include: °· Blood tests to find your blood type and to check for the presence of any previous infections. The tests will also be used to check for low iron levels (anemia) and protein on red blood cells (Rh antibodies). Depending on your risk factors, or if you previously had diabetes during pregnancy, you may have tests to check for high blood sugar  that affects pregnant women (gestational diabetes). °· Urine tests to check for infections, diabetes, or protein in the urine. °· An ultrasound to confirm the proper growth and development of the baby. °· Fetal screens for spinal cord problems (spina bifida) and Down syndrome. °· HIV (human immunodeficiency virus) testing. Routine prenatal testing includes screening for HIV, unless you choose not to have this test. °· You may need other tests to make sure you and the baby are doing well. °Follow these instructions at home: °Medicines °· Follow your health care provider's instructions regarding medicine use. Specific medicines may be either safe or unsafe to take during pregnancy. °· Take a prenatal vitamin that contains at least 600 micrograms (mcg) of folic acid. °· If you develop constipation, try taking a stool softener if your health care provider approves. °Eating and drinking ° °· Eat a balanced diet that includes fresh fruits and vegetables, whole grains, good sources of protein such as meat, eggs, or tofu, and low-fat dairy. Your health care provider will help you determine the amount of weight gain that is right for you. °· Avoid raw meat and uncooked cheese. These carry germs that can cause birth defects in the baby. °· Eating four or five small meals rather than three large meals a day may help relieve nausea and vomiting. If you start to feel nauseous, eating a few soda crackers can be helpful. Drinking liquids between meals, instead of during meals, also seems to help ease nausea and vomiting. °· Limit foods that are high in fat and processed sugars, such as fried and sweet foods. °· To prevent constipation: °? Eat foods that are high in fiber, such as fresh fruits and vegetables, whole grains, and beans. °? Drink enough fluid to keep your urine clear or pale yellow. °Activity °· Exercise only as directed by your health care provider. Most women can continue their usual exercise routine during  pregnancy. Try to exercise for 30 minutes at least 5 days a week. Exercising will help you: °? Control your weight. °? Stay in shape. °? Be prepared for labor and delivery. °· Experiencing pain or cramping in the lower abdomen or lower back is a good sign that you should stop exercising. Check with your health care provider before continuing with normal exercises. °· Try to avoid standing for long periods of time. Move your legs often if you must stand in one place for a long time. °· Avoid heavy lifting. °· Wear low-heeled shoes and practice good posture. °· You may continue to have sex unless your health care provider tells you not to. °Relieving pain and discomfort °· Wear a good support bra to relieve breast tenderness. °· Take warm sitz baths to soothe any pain or discomfort caused by hemorrhoids. Use hemorrhoid cream if your health care provider approves. °· Rest with your legs elevated if you have leg cramps or low back pain. °· If you develop varicose veins in   your legs, wear support hose. Elevate your feet for 15 minutes, 3-4 times a day. Limit salt in your diet. °Prenatal care °· Schedule your prenatal visits by the twelfth week of pregnancy. They are usually scheduled monthly at first, then more often in the last 2 months before delivery. °· Write down your questions. Take them to your prenatal visits. °· Keep all your prenatal visits as told by your health care provider. This is important. °Safety °· Wear your seat belt at all times when driving. °· Make a list of emergency phone numbers, including numbers for family, friends, the hospital, and police and fire departments. °General instructions °· Ask your health care provider for a referral to a local prenatal education class. Begin classes no later than the beginning of month 6 of your pregnancy. °· Ask for help if you have counseling or nutritional needs during pregnancy. Your health care provider can offer advice or refer you to specialists for help  with various needs. °· Do not use hot tubs, steam rooms, or saunas. °· Do not douche or use tampons or scented sanitary pads. °· Do not cross your legs for long periods of time. °· Avoid cat litter boxes and soil used by cats. These carry germs that can cause birth defects in the baby and possibly loss of the fetus by miscarriage or stillbirth. °· Avoid all smoking, herbs, alcohol, and medicines not prescribed by your health care provider. Chemicals in these products affect the formation and growth of the baby. °· Do not use any products that contain nicotine or tobacco, such as cigarettes and e-cigarettes. If you need help quitting, ask your health care provider. You may receive counseling support and other resources to help you quit. °· Schedule a dentist appointment. At home, brush your teeth with a soft toothbrush and be gentle when you floss. °Contact a health care provider if: °· You have dizziness. °· You have mild pelvic cramps, pelvic pressure, or nagging pain in the abdominal area. °· You have persistent nausea, vomiting, or diarrhea. °· You have a bad smelling vaginal discharge. °· You have pain when you urinate. °· You notice increased swelling in your face, hands, legs, or ankles. °· You are exposed to fifth disease or chickenpox. °· You are exposed to German measles (rubella) and have never had it. °Get help right away if: °· You have a fever. °· You are leaking fluid from your vagina. °· You have spotting or bleeding from your vagina. °· You have severe abdominal cramping or pain. °· You have rapid weight gain or loss. °· You vomit blood or material that looks like coffee grounds. °· You develop a severe headache. °· You have shortness of breath. °· You have any kind of trauma, such as from a fall or a car accident. °Summary °· The first trimester of pregnancy is from week 1 until the end of week 13 (months 1 through 3). °· Your body goes through many changes during pregnancy. The changes vary from  woman to woman. °· You will have routine prenatal visits. During those visits, your health care provider will examine you, discuss any test results you may have, and talk with you about how you are feeling. °This information is not intended to replace advice given to you by your health care provider. Make sure you discuss any questions you have with your health care provider. °Document Released: 05/16/2001 Document Revised: 05/03/2016 Document Reviewed: 05/03/2016 °Elsevier Interactive Patient Education © 2019 [employer] ° °[place] [facility] ° ° °[facility]       Phone: [phone] ° °[facility] Phone: [phone] ° °[facility]  Phone: [phone] ° °[facility] [place] [facility]  Phone: [phone] ° °[facility]  Phone: [phone] ° °[facility]       Phone: [phone] ° °[facility]    Phone: [phone]  ° °[facility] ([place])    Phone: [phone] ° °[facility]    Phone: [phone] ° °[facility]    Phone: [phone]  ° °[facility]  Phone: [phone] ° °[facility]    Phone: [phone] ° °[facility]   Phone: [phone] ° °[facility]    Phone: [phone] ° °

## 2018-10-10 NOTE — Telephone Encounter (Signed)
Patient calling for lab results from this morning's appointment.

## 2018-10-10 NOTE — MAU Provider Note (Signed)
Chief Complaint: Pregnancy Ultrasound   First Provider Initiated Contact with Patient 10/10/18 1611     SUBJECTIVE HPI: Elizabeth [name] is a 28 y.o. G2P0010 at [redacted]w[redacted]d who presents to [facility]missions from the office for an ultrasound. Had an HCG in the office today of 28,065. Per Dr. Oscar La, patient needs an ultrasound in MAU due to her history of chlamydia. Pt reports hx of chlamydia x 3 when she was a teenager.  Denies abdominal pain or vaginal bleeding.   Past Medical History:  Diagnosis Date  . Anxiety   . Bipolar 1 disorder (HCC)   . Chlamydia infection 2005, 2008  . Recurrent vaginitis    OB History  Gravida Para Term Preterm AB Living  2       1    SAB TAB Ectopic Multiple Live Births    1          # Outcome Date GA Lbr Len/2nd Weight Sex Delivery Anes PTL Lv  2 Current           1 TAB 2011           Past Surgical History:  Procedure Laterality Date  . ABSCESS DRAINAGE     left groin  . HEEL SPUR SURGERY    . INCISION AND DRAINAGE ABSCESS Left 03/03/2016   Procedure: INCISION AND DRAINAGE ABSCESS;  Surgeon: Leafy Ro, MD;  Location: [facility] ORS;  Service: General;  Laterality: Left;  . right foot surgery    . TONSILLECTOMY AND ADENOIDECTOMY     Social History   Socioeconomic History  . Marital status: Single    Spouse name: Not on file  . Number of children: Not on file  . Years of education: Not on file  . Highest education level: Not on file  Occupational History  . Not on file  Social Needs  . Financial resource strain: Not on file  . Food insecurity:    Worry: Not on file    Inability: Not on file  . Transportation needs:    Medical: Not on file    Non-medical: Not on file  Tobacco Use  . Smoking status: Current Every Day Smoker    Packs/day: 0.50    Types: Cigarettes  . Smokeless tobacco: Never Used  Substance and Sexual Activity  . Alcohol use: Not Currently    Alcohol/week: 1.0 standard drinks    Types: 1 Glasses of wine per week  . Drug  use: Yes    Types: Marijuana    Comment: history of drug abuse/cocaine and narcotics. Used marijuana 2 days ago. Denies cocaine use.  [name] Sexual activity: Yes    Birth control/protection: None  Lifestyle  . Physical activity:    Days per week: Not on file    Minutes per session: Not on file  . Stress: Not on file  Relationships  . Social connections:    Talks on phone: Not on file    Gets together: Not on file    Attends religious service: Not on file    Active member of club or organization: Not on file    Attends meetings of clubs or organizations: Not on file    Relationship status: Not on file  . Intimate partner violence:    Fear of current or ex partner: Not on file    Emotionally abused: Not on file    Physically abused: Not on file    Forced sexual activity: Not on file  Other Topics Concern  .  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 Hypertension Mother   . Thyroid cancer Mother   . Hypertension Father   . Alcohol abuse Paternal Grandfather   . Hypertension Maternal Grandmother   . Cancer Maternal Grandmother   . Heart Problems Paternal Grandmother    No current facility-administered medications on file prior to encounter.    Current Outpatient Medications on File Prior to Encounter  Medication Sig Dispense Refill  . Prenatal Vit-Fe Fumarate-FA (PRENATAL VITAMINS PO) Take by mouth.     Allergies  Allergen Reactions  . Depakote [Divalproex Sodium] Anaphylaxis  . Penicillins Anaphylaxis and Other (See Comments)    Has patient had a PCN reaction causing immediate rash, facial/tongue/throat swelling, SOB or lightheadedness with hypotension: Yes Has patient had a PCN reaction causing severe rash involving mucus membranes or skin necrosis: No Has patient had a PCN reaction that required hospitalization No Has patient had a PCN  reaction occurring within the last 10 years: No If all of the above answers are "NO", then may proceed with Cephalosporin use.  [name]. Risperidone And Related Other (See Comments)    Weight gain  . Trazodone And Nefazodone Other (See Comments)    Pt states that she does not like how this medication makes her feel.   . Vistaril [Hydroxyzine Hcl] Itching  . Peanut Butter Flavor Itching and Rash    Reaction to peanut butter only, tolerates peanuts.    I have reviewed patient's Past Medical Hx, Surgical Hx, Family Hx, Social Hx, medications and allergies.   Review of Systems  Constitutional: Negative.   Gastrointestinal: Negative.   Genitourinary: Negative.     OBJECTIVE Patient Vitals for the past 24 hrs:  BP Temp Pulse Resp Weight  10/10/18 1610 129/64 98.7 F (37.1 C) 76 16 75.3 kg   Constitutional: Well-developed, well-nourished female in no acute distress.  Respiratory: normal rate and effort.  Neurologic: Alert and oriented x 4.   LAB RESULTS Results for orders placed or performed in visit on 10/10/18 (from the past 24 hour(s))  Beta hCG quant (ref lab)     Status: None   Collection Time: 10/10/18  9:46 AM  Result Value Ref Range   hCG Quant 28,065 mIU/mL   Narrative   Performed at:  01 - [facility] [address]  [address], [place], [place]  [id number] Lab Director: Mila HomerWilliam F Hancock MD, Phone:  [phone]    IMAGING [place] Ob Less Than 14 Weeks With Ob Transvaginal  Result Date: 10/10/2018 CLINICAL DATA:  History of chlamydia, pregnant patient EXAM: OBSTETRIC <14 WK US AND TRANSVAGINAL OB US TECHNIQUE: Both transabdominal and transvaginal ultrasound examinations were performed for complete evaluation of the gestation as well as the maternal uterus, adnexal regions, and pelvic cul-de-sac. Transvaginal technique was performed to assess early pregnancy. COMPARISON:  None. FINDINGS: Intrauterine gestational sac: Single Yolk sac:  Visualized. Embryo:  Visualized. Cardiac  Activity: Visualized. Heart Rate: 123 bpm CRL: 5.3 mm   6 w   1 d                  US EDC: 06/04/2019 Subchorionic hemorrhage:  None visualized. Maternal uterus/adnexae: Ovaries are within normal limits. Right ovary measures 4.9 x 2.7 x 2.8 cm and contains a corpus luteum. The left ovary measures 3.3  x 2.6 x 2.6 cm. Trace free fluid. IMPRESSION: 1. Single viable intrauterine pregnancy as above. Trace free fluid in the pelvis. Electronically Signed   By: Jasmine Pang M.D.   On: 10/10/2018 17:21    MAU COURSE Orders Placed This Encounter  Procedures  . US OB LESS THAN 14 WEEKS WITH OB TRANSVAGINAL  . Discharge patient   No orders of the defined types were placed in this encounter.   MDM Pt with no abdominal pain or vaginal bleeding Ultrasound shows live IUP  ASSESSMENT 1. Normal IUP (intrauterine pregnancy) on prenatal ultrasound, first trimester   2. Hx of chlamydia infection     PLAN Discharge home in stable condition. Discussed reasons to return to MAU Start prenatal care, given list of providers  Allergies as of 10/10/2018      Reactions   Depakote [divalproex Sodium] Anaphylaxis   Penicillins Anaphylaxis, Other (See Comments)   Has patient had a PCN reaction causing immediate rash, facial/tongue/throat swelling, SOB or lightheadedness with hypotension: Yes Has patient had a PCN reaction causing severe rash involving mucus membranes or skin necrosis: No Has patient had a PCN reaction that required hospitalization No Has patient had a PCN reaction occurring within the last 10 years: No If all of the above answers are "NO", then may proceed with Cephalosporin use.   Risperidone And Related Other (See Comments)   Weight gain   Trazodone And Nefazodone Other (See Comments)   Pt states that she does not like how this medication makes her feel.    Vistaril [hydroxyzine Hcl] Itching   Peanut Butter Flavor Itching, Rash   Reaction to peanut butter only, tolerates peanuts.       Medication List    TAKE these medications   PRENATAL VITAMINS PO Take by mouth.        Judeth Horn, NP 10/10/2018  5:35 PM

## 2018-10-10 NOTE — Telephone Encounter (Signed)
Notes recorded by Leda Min, RN on 10/10/2018 at 2:44 PM EDT Spoke with patient, advised as seen below per Dr. Oscar La. Denies pain or bleeding. Advised I will call to schedule PUS and return call with appointment details and return call. Patient verbalizes understanding and is agreeable.   See telephone encounter dated 10/10/18.

## 2018-10-10 NOTE — Telephone Encounter (Signed)
-----   Message from Romualdo Bolk, MD sent at 10/10/2018  2:37 PM EDT ----- H/O chlamydia, she needs an ultrasound. Please get her into [facility] imaging

## 2018-10-10 NOTE — Telephone Encounter (Signed)
Call placed to [facility], they do not schedule OB US.   Call placed to [facility] main radiology scheduling, spoke with Sheralyn Boatman. Was advised OB US are only scheduled at [facility] on limited schedule at this time, Wednesdays and Thursdays, no [place] available for 5/7 or 5/8. Patients insurance is medicaid, was advised will need prior authorization prior to scheduling.   Reviewed with Dr. Oscar La, patient needs ultrasound due positive pregnancy test and hx of chlamydia, instruct patient to go directly to [facility] MAU for further evaluation and [place].  Call returned to patient, advised as seen above, patient will go to [facility] MAU today for further evaluation. Patient verbalizes understanding and is agreeable.   Routing to provider for final review. Patient is agreeable to disposition. Will close encounter.

## 2018-10-31 ENCOUNTER — Inpatient Hospital Stay (HOSPITAL_COMMUNITY)
Admission: AD | Admit: 2018-10-31 | Discharge: 2018-10-31 | Disposition: A | Discharge status: Home / Self Care | Payer: Medicaid Other | Attending: Obstetrics and Gynecology | Admitting: Obstetrics and Gynecology

## 2018-10-31 ENCOUNTER — Encounter (HOSPITAL_COMMUNITY): Payer: Self-pay

## 2018-10-31 ENCOUNTER — Other Ambulatory Visit: Payer: Self-pay

## 2018-10-31 DIAGNOSIS — R141 Gas pain: Secondary | ICD-10-CM

## 2018-10-31 DIAGNOSIS — T7491XA Unspecified adult maltreatment, confirmed, initial encounter: Secondary | ICD-10-CM

## 2018-10-31 DIAGNOSIS — Z362 Encounter for other antenatal screening follow-up: Secondary | ICD-10-CM

## 2018-10-31 DIAGNOSIS — F411 Generalized anxiety disorder: Secondary | ICD-10-CM

## 2018-10-31 DIAGNOSIS — Z87891 Personal history of nicotine dependence: Secondary | ICD-10-CM | POA: Insufficient documentation

## 2018-10-31 DIAGNOSIS — Z590 Homelessness: Secondary | ICD-10-CM | POA: Diagnosis not present

## 2018-10-31 DIAGNOSIS — Z3A09 9 weeks gestation of pregnancy: Secondary | ICD-10-CM | POA: Diagnosis not present

## 2018-10-31 DIAGNOSIS — R109 Unspecified abdominal pain: Secondary | ICD-10-CM | POA: Diagnosis not present

## 2018-10-31 DIAGNOSIS — O26891 Other specified pregnancy related conditions, first trimester: Secondary | ICD-10-CM | POA: Diagnosis present

## 2018-10-31 LAB — URINALYSIS, ROUTINE W REFLEX MICROSCOPIC
Bilirubin Urine: NEGATIVE
Glucose, UA: NEGATIVE mg/dL
Hgb urine dipstick: NEGATIVE
Ketones, ur: NEGATIVE mg/dL
Leukocytes,Ua: NEGATIVE
Nitrite: NEGATIVE
Protein, ur: NEGATIVE mg/dL
Specific Gravity, Urine: 1.01 (ref 1.005–1.030)
pH: 8 (ref 5.0–8.0)
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
## 2018-10-31 NOTE — Discharge Instructions (Signed)
First Trimester of Pregnancy  The first trimester of pregnancy is from week 1 until the end of week 13 (months 1 through 3). A week after a sperm fertilizes an egg, the egg will implant on the wall of the uterus. This embryo will begin to develop into a baby. Genes from you and your partner will form the baby. The female genes will determine whether the baby will be a boy or a girl. At 6-8 weeks, the eyes and face will be formed, and the heartbeat can be seen on ultrasound. At the end of 12 weeks, all the baby's organs will be formed.  Now that you are pregnant, you will want to do everything you can to have a healthy baby. Two of the most important things are to get good prenatal care and to follow your health care provider's instructions. Prenatal care is all the medical care you receive before the baby's birth. This care will help prevent, find, and treat any problems during the pregnancy and childbirth.  Body changes during your first trimester  Your body goes through many changes during pregnancy. The changes vary from woman to woman.   You may gain or lose a couple of pounds at first.   You may feel sick to your stomach (nauseous) and you may throw up (vomit). If the vomiting is uncontrollable, call your health care provider.   You may tire easily.   You may develop headaches that can be relieved by medicines. All medicines should be approved by your health care provider.   You may urinate more often. Painful urination may mean you have a bladder infection.   You may develop heartburn as a result of your pregnancy.   You may develop constipation because certain hormones are causing the muscles that push stool through your intestines to slow down.   You may develop hemorrhoids or swollen veins (varicose veins).   Your breasts may begin to grow larger and become tender. Your nipples may stick out more, and the tissue that surrounds them (areola) may become darker.   Your gums may bleed and may be  sensitive to brushing and flossing.   Dark spots or blotches (chloasma, mask of pregnancy) may develop on your face. This will likely fade after the baby is born.   Your menstrual periods will stop.   You may have a loss of appetite.   You may develop cravings for certain kinds of food.   You may have changes in your emotions from day to day, such as being excited to be pregnant or being concerned that something may go wrong with the pregnancy and baby.   You may have more vivid and strange dreams.   You may have changes in your hair. These can include thickening of your hair, rapid growth, and changes in texture. Some women also have hair loss during or after pregnancy, or hair that feels dry or thin. Your hair will most likely return to normal after your baby is born.  What to expect at prenatal visits  During a routine prenatal visit:   You will be weighed to make sure you and the baby are growing normally.   Your blood pressure will be taken.   Your abdomen will be measured to track your baby's growth.   The fetal heartbeat will be listened to between weeks 10 and 14 of your pregnancy.   Test results from any previous visits will be discussed.  Your health care provider may ask you:     How you are feeling.   If you are feeling the baby move.   If you have had any abnormal symptoms, such as leaking fluid, bleeding, severe headaches, or abdominal cramping.   If you are using any tobacco products, including cigarettes, chewing tobacco, and electronic cigarettes.   If you have any questions.  Other tests that may be performed during your first trimester include:   Blood tests to find your blood type and to check for the presence of any previous infections. The tests will also be used to check for low iron levels (anemia) and protein on red blood cells (Rh antibodies). Depending on your risk factors, or if you previously had diabetes during pregnancy, you may have tests to check for high blood sugar  that affects pregnant women (gestational diabetes).   Urine tests to check for infections, diabetes, or protein in the urine.   An ultrasound to confirm the proper growth and development of the baby.   Fetal screens for spinal cord problems (spina bifida) and Down syndrome.   HIV (human immunodeficiency virus) testing. Routine prenatal testing includes screening for HIV, unless you choose not to have this test.   You may need other tests to make sure you and the baby are doing well.  Follow these instructions at home:  Medicines   Follow your health care provider's instructions regarding medicine use. Specific medicines may be either safe or unsafe to take during pregnancy.   Take a prenatal vitamin that contains at least 600 micrograms (mcg) of folic acid.   If you develop constipation, try taking a stool softener if your health care provider approves.  Eating and drinking     Eat a balanced diet that includes fresh fruits and vegetables, whole grains, good sources of protein such as meat, eggs, or tofu, and low-fat dairy. Your health care provider will help you determine the amount of weight gain that is right for you.   Avoid raw meat and uncooked cheese. These carry germs that can cause birth defects in the baby.   Eating four or five small meals rather than three large meals a day may help relieve nausea and vomiting. If you start to feel nauseous, eating a few soda crackers can be helpful. Drinking liquids between meals, instead of during meals, also seems to help ease nausea and vomiting.   Limit foods that are high in fat and processed sugars, such as fried and sweet foods.   To prevent constipation:  ? Eat foods that are high in fiber, such as fresh fruits and vegetables, whole grains, and beans.  ? Drink enough fluid to keep your urine clear or pale yellow.  Activity   Exercise only as directed by your health care provider. Most women can continue their usual exercise routine during  pregnancy. Try to exercise for 30 minutes at least 5 days a week. Exercising will help you:  ? Control your weight.  ? Stay in shape.  ? Be prepared for labor and delivery.   Experiencing pain or cramping in the lower abdomen or lower back is a good sign that you should stop exercising. Check with your health care provider before continuing with normal exercises.   Try to avoid standing for long periods of time. Move your legs often if you must stand in one place for a long time.   Avoid heavy lifting.   Wear low-heeled shoes and practice good posture.   You may continue to have sex unless your health care   provider tells you not to.  Relieving pain and discomfort   Wear a good support bra to relieve breast tenderness.   Take warm sitz baths to soothe any pain or discomfort caused by hemorrhoids. Use hemorrhoid cream if your health care provider approves.   Rest with your legs elevated if you have leg cramps or low back pain.   If you develop varicose veins in your legs, wear support hose. Elevate your feet for 15 minutes, 3-4 times a day. Limit salt in your diet.  Prenatal care   Schedule your prenatal visits by the twelfth week of pregnancy. They are usually scheduled monthly at first, then more often in the last 2 months before delivery.   Write down your questions. Take them to your prenatal visits.   Keep all your prenatal visits as told by your health care provider. This is important.  Safety   Wear your seat belt at all times when driving.   Make a list of emergency phone numbers, including numbers for family, friends, the hospital, and police and fire departments.  General instructions   Ask your health care provider for a referral to a local prenatal education class. Begin classes no later than the beginning of month 6 of your pregnancy.   Ask for help if you have counseling or nutritional needs during pregnancy. Your health care provider can offer advice or refer you to specialists for help  with various needs.   Do not use hot tubs, steam rooms, or saunas.   Do not douche or use tampons or scented sanitary pads.   Do not cross your legs for long periods of time.   Avoid cat litter boxes and soil used by cats. These carry germs that can cause birth defects in the baby and possibly loss of the fetus by miscarriage or stillbirth.   Avoid all smoking, herbs, alcohol, and medicines not prescribed by your health care provider. Chemicals in these products affect the formation and growth of the baby.   Do not use any products that contain nicotine or tobacco, such as cigarettes and e-cigarettes. If you need help quitting, ask your health care provider. You may receive counseling support and other resources to help you quit.   Schedule a dentist appointment. At home, brush your teeth with a soft toothbrush and be gentle when you floss.  Contact a health care provider if:   You have dizziness.   You have mild pelvic cramps, pelvic pressure, or nagging pain in the abdominal area.   You have persistent nausea, vomiting, or diarrhea.   You have a bad smelling vaginal discharge.   You have pain when you urinate.   You notice increased swelling in your face, hands, legs, or ankles.   You are exposed to fifth disease or chickenpox.   You are exposed to German measles (rubella) and have never had it.  Get help right away if:   You have a fever.   You are leaking fluid from your vagina.   You have spotting or bleeding from your vagina.   You have severe abdominal cramping or pain.   You have rapid weight gain or loss.   You vomit blood or material that looks like coffee grounds.   You develop a severe headache.   You have shortness of breath.   You have any kind of trauma, such as from a fall or a car accident.  Summary   The first trimester of pregnancy is from week 1 until   the end of week 13 (months 1 through 3).   Your body goes through many changes during pregnancy. The changes vary from  woman to woman.   You will have routine prenatal visits. During those visits, your health care provider will examine you, discuss any test results you may have, and talk with you about how you are feeling.  This information is not intended to replace advice given to you by your health care provider. Make sure you discuss any questions you have with your health care provider.  Document Released: 05/16/2001 Document Revised: 05/03/2016 Document Reviewed: 05/03/2016  Elsevier Interactive Patient Education  2019 [employer]vier Inc[employer]

## 2018-10-31 NOTE — MAU Note (Signed)
Elizabeth [name] is a 28 y.o. at [redacted]w[redacted]d here in MAU reporting: lower abdominal cramping that started this morning, had cramping a couple of weeks ago also. Denies any vaginal bleeding   Onset of complaint:today Pain score:1 Vitals:   10/31/18 1213  BP: (!) 152/70  Pulse: 85  Resp: 18  Temp: 98 F (36.7 C)  SpO2: 100%      Lab orders placed from triage: UA

## 2018-10-31 NOTE — MAU Note (Signed)
Contacted social work per provider, left voicemail, waiting for return call.

## 2018-11-18 LAB — HEPATITIS B SURFACE ANTIGEN: Hepatitis B Surface Ag: NONREACTIVE

## 2018-11-18 LAB — CBC AND DIFFERENTIAL
HCT: 38 (ref 36–46)
Hemoglobin: 13 (ref 12.0–16.0)
Platelets: 257 (ref 150–399)

## 2018-11-18 LAB — HIV ANTIBODY (ROUTINE TESTING W REFLEX): HIV 1&2 Ab, 4th Generation: NEGATIVE

## 2018-11-26 LAB — OB RESULTS CONSOLE RUBELLA ANTIBODY, IGM: Rubella: IMMUNE

## 2019-01-27 LAB — OB RESULTS CONSOLE GBS: GBS: NEGATIVE

## 2019-01-30 NOTE — Progress Notes (Deleted)
28 y.o. G2P0010 Single African American female here for annual exam.    PCP:     Patient's last menstrual period was 08/25/2018 (exact date).           Sexually active: {yes no:314532}  The current method of family planning is {contraception:315051}.    Exercising: {yes no:314532}  {types:19826} Smoker:  {YES NO:22349}  Health Maintenance: Pap: 01-18-18 Neg:Neg HR HPV History of abnormal Pap:  {YES NO:22349} MMG:  *** Colonoscopy:  *** BMD:   ***  Result  *** TDaP:  11-21-15 Gardasil:   2 of 3  HIV: 11-18-18 Neg Hep C:01-18-18 Neg Screening Labs:  Hb today: ***, Urine today: ***   reports that she has quit smoking. Her smoking use included cigarettes. She smoked 0.50 packs per day. She has never used smokeless tobacco. She reports previous alcohol use of about 1.0 standard drinks of alcohol per week. She reports current drug use. Drug: Marijuana.  Past Medical History:  Diagnosis Date  . Anxiety   . Bipolar 1 disorder (HCC)   . Chlamydia infection 2005, 2008  . Recurrent vaginitis     Past Surgical History:  Procedure Laterality Date  . ABSCESS DRAINAGE     left groin  . HEEL SPUR SURGERY    . INCISION AND DRAINAGE ABSCESS Left 03/03/2016   Procedure: INCISION AND DRAINAGE ABSCESS;  Surgeon: Leafy Roiego F Pabon, MD;  Location: [facility] ORS;  Service: General;  Laterality: Left;  . right foot surgery    . TONSILLECTOMY AND ADENOIDECTOMY      Current Outpatient Medications  Medication Sig Dispense Refill  . Prenatal Vit-Fe Fumarate-FA (PRENATAL VITAMINS PO) Take by mouth.     No current facility-administered medications for this visit.     Family History  Problem Relation Age of Onset  . Hypertension Mother   . Thyroid cancer Mother   . Hypertension Father   . Alcohol abuse Paternal Grandfather   . Hypertension Maternal Grandmother   . Cancer Maternal Grandmother   . Heart Problems Paternal Grandmother     Review of Systems  Exam:   LMP 08/25/2018 (Exact Date)      General appearance: alert, cooperative and appears stated age Head: normocephalic, without obvious abnormality, atraumatic Neck: no adenopathy, supple, symmetrical, trachea midline and thyroid normal to inspection and palpation Lungs: clear to auscultation bilaterally Breasts: normal appearance, no masses or tenderness, No nipple retraction or dimpling, No nipple discharge or bleeding, No axillary adenopathy Heart: regular rate and rhythm Abdomen: soft, non-tender; no masses, no organomegaly Extremities: extremities normal, atraumatic, no cyanosis or edema Skin: skin color, texture, turgor normal. No rashes or lesions Lymph nodes: cervical, supraclavicular, and axillary nodes normal. Neurologic: grossly normal  Pelvic: External genitalia:  no lesions              No abnormal inguinal nodes palpated.              Urethra:  normal appearing urethra with no masses, tenderness or lesions              Bartholins and Skenes: normal                 Vagina: normal appearing vagina with normal color and discharge, no lesions              Cervix: no lesions              Pap taken: {yes no:314532} Bimanual Exam:  Uterus:  normal size, contour, position, consistency, mobility,  non-tender              Adnexa: no mass, fullness, tenderness              Rectal exam: {yes no:314532}.  Confirms.              Anus:  normal sphincter tone, no lesions  Chaperone was present for exam.  Assessment:   Well woman visit with normal exam.   Plan: Mammogram screening discussed. Self breast awareness reviewed. Pap and HR HPV as above. Guidelines for Calcium, Vitamin D, regular exercise program including cardiovascular and weight bearing exercise.   Follow up annually and prn.   Additional counseling given.  {yes [id number]. _______ minutes face to face time of which over 50% was spent in counseling.    After visit summary provided.

## 2019-01-31 ENCOUNTER — Ambulatory Visit: Payer: Medicaid Other | Admitting: Obstetrics and Gynecology

## 2019-01-31 ENCOUNTER — Encounter: Payer: Self-pay | Admitting: Obstetrics and Gynecology

## 2019-02-05 ENCOUNTER — Telehealth: Payer: Self-pay | Admitting: Family Medicine

## 2019-02-05 NOTE — Telephone Encounter (Signed)
Patient needs to be triaged.   Copied from [place] [id number]. Topic: General - Other >> Feb 05, 2019  2:38 PM Keene Breath wrote: Reason for CRM: Patient called to schedule an appt. As soon as possible.  With the PCP regarding tingling in fingers.    Tried the office 2x but no answer.  CB# [phone]

## 2019-02-05 NOTE — Telephone Encounter (Signed)
Attempted to contact patient at # provided and received busy signal x2.

## 2019-02-05 NOTE — Telephone Encounter (Signed)
Spoke to patient.  She has been c/o numbness and tingling in b/l hands and arms x 1 month.  Currently 6 months pregnant.    She denies radiating numbness/tingling to legs, neck or back pain, chest pain, dizziness, headaches or confusion.  States that she thinks it may be related to her sleeping position.    Offered doxy visit tomorrow w/Dr. Rogers Blocker but patient declined stating that she had to work.  She requested a doxy visit for Friday afternoon 9/4 but Dr. Rogers Blocker is not here in the afternoon.  Patient has MA and I advised that the best option for her may be to be seen at the [facility] in [place].  Will forward information to patient via [place].  Patient verbalized understanding of all of the above.

## 2019-02-06 NOTE — Telephone Encounter (Signed)
Spoke with patient and reviewed notes per Dr. Rogers Blocker.  She verbalizes understanding.  She did speak to her OB and they advised heat and ibuprofen which she reports does not help.  Advised her to follow up with the [facility]; contact info provided to patient via [place].

## 2019-02-06 NOTE — Telephone Encounter (Signed)
Let her know this could be related to pregnancy too. Very common to get carpal tunnel during pregnancy. Would discuss with OB as well. I could do a doxy in the AM tomorrow if she has time?  Orma Flaming, MD [facility]

## 2019-02-23 ENCOUNTER — Inpatient Hospital Stay (HOSPITAL_COMMUNITY)
Admission: AD | Admit: 2019-02-23 | Discharge: 2019-02-23 | Disposition: A | Discharge status: Home / Self Care | Payer: Medicaid Other | Source: Ambulatory Visit | Attending: Obstetrics and Gynecology | Admitting: Obstetrics and Gynecology

## 2019-02-23 ENCOUNTER — Encounter (HOSPITAL_COMMUNITY): Payer: Self-pay

## 2019-02-23 ENCOUNTER — Other Ambulatory Visit: Payer: Self-pay

## 2019-02-23 DIAGNOSIS — Z880 Allergy status to penicillin: Secondary | ICD-10-CM | POA: Insufficient documentation

## 2019-02-23 DIAGNOSIS — H918X3 Other specified hearing loss, bilateral: Secondary | ICD-10-CM

## 2019-02-23 DIAGNOSIS — Z888 Allergy status to other drugs, medicaments and biological substances status: Secondary | ICD-10-CM | POA: Insufficient documentation

## 2019-02-23 DIAGNOSIS — Z3A26 26 weeks gestation of pregnancy: Secondary | ICD-10-CM | POA: Insufficient documentation

## 2019-02-23 DIAGNOSIS — H6123 Impacted cerumen, bilateral: Secondary | ICD-10-CM | POA: Diagnosis not present

## 2019-02-23 DIAGNOSIS — O26892 Other specified pregnancy related conditions, second trimester: Secondary | ICD-10-CM | POA: Diagnosis not present

## 2019-02-23 DIAGNOSIS — Z87891 Personal history of nicotine dependence: Secondary | ICD-10-CM | POA: Insufficient documentation

## 2019-02-23 NOTE — MAU Note (Addendum)
Pt reports that she can't hear out of both ears that started a few days ago. Reports when the seasons change, she usually has this problem. Has tried flushing her ears out. Reports nasal congestion. Wants something for allergies. Denies fever, cough, sore throat, or SOB. Did not call [facility].

## 2019-02-23 NOTE — MAU Provider Note (Signed)
History     CSN: [id number]  Arrival date and time: 02/23/19 2725   First Provider Initiated Contact with Patient 02/23/19 [phone]      Chief Complaint  Patient presents with  . Ear Fullness   G2P0010 @26 .0 wks presenting with ear congestion and hearing loss. Reports sx started 2 days ago. States this happens when the season changes. Hearing loss is bilateral. Has tried flushing her ears with warm water without relief. Denies pain. No cough or fevers. Reports hx of excess ear wax. Denies putting anything in her ears. No pregnancy complaints. Pt is requesting to have ears flushed.   OB History    Gravida  2   Para      Term      Preterm      AB  1   Living        SAB      TAB  1   Ectopic      Multiple      Live Births              Past Medical History:  Diagnosis Date  . Anxiety   . Bipolar 1 disorder ([place])   . Chlamydia infection 2005, 2008  . Recurrent vaginitis     Past Surgical History:  Procedure Laterality Date  . ABSCESS DRAINAGE     left groin  . HEEL SPUR SURGERY    . INCISION AND DRAINAGE ABSCESS Left 03/03/2016   Procedure: INCISION AND DRAINAGE ABSCESS;  Surgeon: Jules Husbands, MD;  Location: [facility] ORS;  Service: General;  Laterality: Left;  . right foot surgery    . TONSILLECTOMY AND ADENOIDECTOMY      Family History  Problem Relation Age of Onset  . Hypertension Mother   . Thyroid cancer Mother   . Hypertension Father   . Alcohol abuse Paternal Grandfather   . Hypertension Maternal Grandmother   . Cancer Maternal Grandmother   . Heart Problems Paternal Grandmother     Social History   Tobacco Use  . Smoking status: Former Smoker    Packs/day: 0.50    Types: Cigarettes  . Smokeless tobacco: Never Used  Substance Use Topics  . Alcohol use: Not Currently    Alcohol/week: 1.0 standard drinks    Types: 1 Glasses of wine per week  . Drug use: Yes    Types: Marijuana    Comment: Last use 3 weeks ago    Allergies:  Allergies   Allergen Reactions  . Depakote [Divalproex Sodium] Anaphylaxis  . Penicillins Anaphylaxis and Other (See Comments)    Has patient had a PCN reaction causing immediate rash, facial/tongue/throat swelling, SOB or lightheadedness with hypotension: Yes Has patient had a PCN reaction causing severe rash involving mucus membranes or skin necrosis: No Has patient had a PCN reaction that required hospitalization No Has patient had a PCN reaction occurring within the last 10 years: No If all of the above answers are "NO", then may proceed with Cephalosporin use.  [name] Risperidone And Related Other (See Comments)    Weight gain  . Trazodone And Nefazodone Other (See Comments)    Pt states that she does not like how this medication makes her feel.   . Vistaril [Hydroxyzine Hcl] Itching  . Peanut Butter Flavor Itching and Rash    Reaction to peanut butter only, tolerates peanuts.    Medications Prior to Admission  Medication Sig Dispense Refill Last Dose  . Prenatal Vit-Fe Fumarate-FA (PRENATAL VITAMINS PO) Take by  mouth.   02/22/2019 at Unknown time    Review of Systems  Constitutional: Negative for fever.  HENT: Positive for congestion and hearing loss. Negative for ear discharge, ear pain and sore throat.   Respiratory: Negative for cough.    Physical Exam   Blood pressure 122/72, pulse (!) 107, resp. rate 18, height 5\' 6"  (1.676 m), weight 93.4 kg, last menstrual period 08/25/2018, SpO2 100 %.  Physical Exam  Nursing note and vitals reviewed. Constitutional: She is oriented to person, place, and time. She appears well-developed and well-nourished. No distress.  HENT:  Head: Normocephalic and atraumatic.  Right Ear: External ear normal. Decreased hearing is noted.  Left Ear: External ear normal. Decreased hearing is noted.  Nose: Nose normal.  Mouth/Throat: Uvula is midline, oropharynx is clear and moist and mucous membranes are normal.  Unable to visualized TM in both ears d/t cerumen   Respiratory: Effort normal. No respiratory distress.  Musculoskeletal: Normal range of motion.  Neurological: She is alert and oriented to person, place, and time.  Psychiatric: She has a normal mood and affect.  FHT 155  MAU Course  Procedures  MDM Recommend starting Claritin or Zyrtec. Informed pt ear lavage cannot be performed in MAU, [facility]. Stable for discharge home.   Assessment and Plan   1. [redacted] weeks gestation of pregnancy   2. Bilateral impacted cerumen   3. Other specified hearing loss of both ears    [facility] Start Claritin OTC once daily  Allergies as of 02/23/2019      Reactions   Depakote [divalproex Sodium] Anaphylaxis   Penicillins Anaphylaxis, Other (See Comments)   Has patient had a PCN reaction causing immediate rash, facial/tongue/throat swelling, SOB or lightheadedness with hypotension: Yes Has patient had a PCN reaction causing severe rash involving mucus membranes or skin necrosis: No Has patient had a PCN reaction that required hospitalization No Has patient had a PCN reaction occurring within the last 10 years: No If all of the above answers are "NO", then may proceed with Cephalosporin use.   Risperidone And Related Other (See Comments)   Weight gain   Trazodone And Nefazodone Other (See Comments)   Pt states that she does not like how this medication makes her feel.    Vistaril [hydroxyzine Hcl] Itching   Peanut Butter Flavor Itching, Rash   Reaction to peanut butter only, tolerates peanuts.      Medication List    TAKE these medications   PRENATAL VITAMINS PO Take by mouth.      Donette LarryMelanie Eldor Conaway, CNM 02/23/2019, 6:39 AM

## 2019-02-23 NOTE — Discharge Instructions (Signed)
Earwax Buildup, Adult The ears produce a substance called earwax that helps keep bacteria out of the ear and protects the skin in the ear canal. Occasionally, earwax can build up in the ear and cause discomfort or hearing loss. What increases the risk? This condition is more likely to develop in people who:  Are female.  Are elderly.  Naturally produce more earwax.  Clean their ears often with cotton swabs.  Use earplugs often.  Use in-ear headphones often.  Wear hearing aids.  Have narrow ear canals.  Have earwax that is overly thick or sticky.  Have eczema.  Are dehydrated.  Have excess hair in the ear canal. What are the signs or symptoms? Symptoms of this condition include:  Reduced or muffled hearing.  A feeling of fullness in the ear or feeling that the ear is plugged.  Fluid coming from the ear.  Ear pain.  Ear itch.  Ringing in the ear.  Coughing.  An obvious piece of earwax that can be seen inside the ear canal. How is this diagnosed? This condition may be diagnosed based on:  Your symptoms.  Your medical history.  An ear exam. During the exam, your health care provider will look into your ear with an instrument called an otoscope. You may have tests, including a hearing test. How is this treated? This condition may be treated by:  Using ear drops to soften the earwax.  Having the earwax removed by a health care provider. The health care provider may: ? Flush the ear with water. ? Use an instrument that has a loop on the end (curette). ? Use a suction device.  Surgery to remove the wax buildup. This may be done in severe cases. Follow these instructions at home:   Take over-the-counter and prescription medicines only as told by your health care provider.  Do not put any objects, including cotton swabs, into your ear. You can clean the opening of your ear canal with a washcloth or facial tissue.  Follow instructions from your health care  provider about cleaning your ears. Do not over-clean your ears.  Drink enough fluid to keep your urine clear or pale yellow. This will help to thin the earwax.  Keep all follow-up visits as told by your health care provider. If earwax builds up in your ears often or if you use hearing aids, consider seeing your health care provider for routine, preventive ear cleanings. Ask your health care provider how often you should schedule your cleanings.  If you have hearing aids, clean them according to instructions from the manufacturer and your health care provider. Contact a health care provider if:  You have ear pain.  You develop a fever.  You have blood, pus, or other fluid coming from your ear.  You have hearing loss.  You have ringing in your ears that does not go away.  Your symptoms do not improve with treatment.  You feel like the room is spinning (vertigo). Summary  Earwax can build up in the ear and cause discomfort or hearing loss.  The most common symptoms of this condition include reduced or muffled hearing and a feeling of fullness in the ear or feeling that the ear is plugged.  This condition may be diagnosed based on your symptoms, your medical history, and an ear exam.  This condition may be treated by using ear drops to soften the earwax or by having the earwax removed by a health care provider.  Do not put any   objects, including cotton swabs, into your ear. You can clean the opening of your ear canal with a washcloth or facial tissue. This information is not intended to replace advice given to you by your health care provider. Make sure you discuss any questions you have with your health care provider. Document Released: 06/29/2004 Document Revised: 05/04/2017 Document Reviewed: 08/02/2016 Elsevier Patient Education  2020 [employer]vier Inc.  

## 2019-03-28 LAB — TYPE AND SCREEN: .: NEGATIVE

## 2019-05-07 DIAGNOSIS — Z113 Encounter for screening for infections with a predominantly sexual mode of transmission: Secondary | ICD-10-CM | POA: Diagnosis not present

## 2019-05-07 DIAGNOSIS — Z3492 Encounter for supervision of normal pregnancy, unspecified, second trimester: Secondary | ICD-10-CM | POA: Diagnosis not present

## 2019-05-07 DIAGNOSIS — Z3A36 36 weeks gestation of pregnancy: Secondary | ICD-10-CM | POA: Diagnosis not present

## 2019-05-07 DIAGNOSIS — Z3493 Encounter for supervision of normal pregnancy, unspecified, third trimester: Secondary | ICD-10-CM | POA: Diagnosis not present

## 2019-05-11 LAB — OB RESULTS CONSOLE GC/CHLAMYDIA
Chlamydia: NEGATIVE
Gonorrhea: NEGATIVE

## 2019-06-03 DIAGNOSIS — R809 Proteinuria, unspecified: Secondary | ICD-10-CM | POA: Diagnosis not present

## 2019-06-04 ENCOUNTER — Inpatient Hospital Stay (HOSPITAL_COMMUNITY)
Admission: AD | Admit: 2019-06-04 | Discharge: 2019-06-07 | DRG: 786 | Disposition: A | Discharge status: Home / Self Care | Payer: Medicaid Other | Attending: Obstetrics & Gynecology | Admitting: Obstetrics & Gynecology

## 2019-06-04 ENCOUNTER — Inpatient Hospital Stay (HOSPITAL_COMMUNITY): Payer: Medicaid Other | Admitting: Anesthesiology

## 2019-06-04 ENCOUNTER — Encounter (HOSPITAL_COMMUNITY)
Admission: AD | Disposition: A | Discharge status: Home / Self Care | Payer: Self-pay | Source: Home / Self Care | Attending: Obstetrics & Gynecology

## 2019-06-04 ENCOUNTER — Other Ambulatory Visit: Payer: Self-pay

## 2019-06-04 ENCOUNTER — Encounter (HOSPITAL_COMMUNITY): Payer: Self-pay | Admitting: Obstetrics and Gynecology

## 2019-06-04 DIAGNOSIS — J189 Pneumonia, unspecified organism: Secondary | ICD-10-CM | POA: Diagnosis not present

## 2019-06-04 DIAGNOSIS — Z87891 Personal history of nicotine dependence: Secondary | ICD-10-CM | POA: Diagnosis not present

## 2019-06-04 DIAGNOSIS — Z880 Allergy status to penicillin: Secondary | ICD-10-CM | POA: Diagnosis not present

## 2019-06-04 DIAGNOSIS — Z20822 Contact with and (suspected) exposure to covid-19: Secondary | ICD-10-CM | POA: Diagnosis present

## 2019-06-04 DIAGNOSIS — O9081 Anemia of the puerperium: Secondary | ICD-10-CM | POA: Diagnosis not present

## 2019-06-04 DIAGNOSIS — O26893 Other specified pregnancy related conditions, third trimester: Secondary | ICD-10-CM | POA: Diagnosis present

## 2019-06-04 DIAGNOSIS — R059 Cough, unspecified: Secondary | ICD-10-CM

## 2019-06-04 DIAGNOSIS — R05 Cough: Secondary | ICD-10-CM

## 2019-06-04 DIAGNOSIS — O99324 Drug use complicating childbirth: Secondary | ICD-10-CM | POA: Diagnosis present

## 2019-06-04 DIAGNOSIS — O9952 Diseases of the respiratory system complicating childbirth: Secondary | ICD-10-CM | POA: Diagnosis present

## 2019-06-04 DIAGNOSIS — O8612 Endometritis following delivery: Secondary | ICD-10-CM | POA: Diagnosis not present

## 2019-06-04 DIAGNOSIS — D62 Acute posthemorrhagic anemia: Secondary | ICD-10-CM | POA: Diagnosis not present

## 2019-06-04 DIAGNOSIS — F1290 Cannabis use, unspecified, uncomplicated: Secondary | ICD-10-CM | POA: Diagnosis present

## 2019-06-04 DIAGNOSIS — J811 Chronic pulmonary edema: Secondary | ICD-10-CM

## 2019-06-04 DIAGNOSIS — R000 Tachycardia, unspecified: Secondary | ICD-10-CM | POA: Diagnosis present

## 2019-06-04 DIAGNOSIS — O1404 Mild to moderate pre-eclampsia, complicating childbirth: Secondary | ICD-10-CM | POA: Diagnosis present

## 2019-06-04 DIAGNOSIS — O1494 Unspecified pre-eclampsia, complicating childbirth: Secondary | ICD-10-CM | POA: Diagnosis not present

## 2019-06-04 DIAGNOSIS — O1400 Mild to moderate pre-eclampsia, unspecified trimester: Secondary | ICD-10-CM | POA: Diagnosis not present

## 2019-06-04 DIAGNOSIS — O99824 Streptococcus B carrier state complicating childbirth: Secondary | ICD-10-CM | POA: Diagnosis present

## 2019-06-04 DIAGNOSIS — Z3A40 40 weeks gestation of pregnancy: Secondary | ICD-10-CM | POA: Diagnosis not present

## 2019-06-04 DIAGNOSIS — O1403 Mild to moderate pre-eclampsia, third trimester: Secondary | ICD-10-CM

## 2019-06-04 LAB — PROTEIN / CREATININE RATIO, URINE
Creatinine, Urine: 94.37 mg/dL
Creatinine, Urine: 116.37 mg/dL
Protein Creatinine Ratio: 0.75 mg/mg{Cre} — ABNORMAL HIGH (ref 0.00–0.15)
Protein Creatinine Ratio: 0.52 mg/mg{Cre} — ABNORMAL HIGH (ref 0.00–0.15)
Total Protein, Urine: 71 mg/dL
Total Protein, Urine: 61 mg/dL

## 2019-06-04 LAB — URINALYSIS, ROUTINE W REFLEX MICROSCOPIC
Bilirubin Urine: NEGATIVE
Glucose, UA: NEGATIVE mg/dL
Ketones, ur: NEGATIVE mg/dL
Nitrite: NEGATIVE
Protein, ur: 100 mg/dL — AB
Specific Gravity, Urine: 1.011 (ref 1.005–1.030)
pH: 6 (ref 5.0–8.0)

## 2019-06-04 LAB — CBC
HCT: 39.6 % (ref 36.0–46.0)
Hemoglobin: 14 g/dL (ref 12.0–15.0)
MCH: 32.7 pg (ref 26.0–34.0)
MCHC: 35.4 g/dL (ref 30.0–36.0)
MCV: 92.5 fL (ref 80.0–100.0)
Platelets: 174 10*3/uL (ref 150–400)
RBC: 4.28 MIL/uL (ref 3.87–5.11)
RDW: 13.5 % (ref 11.5–15.5)
WBC: 8.1 10*3/uL (ref 4.0–10.5)
nRBC: 0 % (ref 0.0–0.2)

## 2019-06-04 LAB — TYPE AND SCREEN
ABO/RH(D): O POS
Antibody Screen: NEGATIVE

## 2019-06-04 LAB — RPR: RPR Ser Ql: NONREACTIVE

## 2019-06-04 LAB — RESPIRATORY PANEL BY RT PCR (FLU A&B, COVID)
Influenza A by PCR: NEGATIVE
Influenza B by PCR: NEGATIVE
SARS Coronavirus 2 by RT PCR: NEGATIVE

## 2019-06-04 LAB — ABO/RH: ABO/RH(D): O POS

## 2019-06-04 LAB — POCT FERN TEST: POCT Fern Test: NEGATIVE

## 2019-06-04 SURGERY — Surgical Case
Anesthesia: Epidural

## 2019-06-04 MED ORDER — ACETAMINOPHEN 325 MG PO TABS
650.0000 mg | ORAL_TABLET | ORAL | Status: DC | PRN
  Administered 2019-06-06 – 2019-06-07 (×2): 650 mg via ORAL
  Filled 2019-06-04 (×2): qty 2

## 2019-06-04 MED ORDER — ONDANSETRON HCL 4 MG/2ML IJ SOLN: 4.0000 mg | Freq: Once | INTRAMUSCULAR | Status: DC | PRN

## 2019-06-04 MED ORDER — MIDAZOLAM HCL 2 MG/2ML IJ SOLN
INTRAMUSCULAR | Status: AC
  Filled 2019-06-04: qty 2

## 2019-06-04 MED ORDER — OXYCODONE-ACETAMINOPHEN 5-325 MG PO TABS
2.0000 | ORAL_TABLET | ORAL | Status: AC
  Administered 2019-06-04: 2 via ORAL
  Filled 2019-06-04: qty 2

## 2019-06-04 MED ORDER — OXYCODONE-ACETAMINOPHEN 5-325 MG PO TABS: 1.0000 | ORAL_TABLET | ORAL | Status: DC | PRN

## 2019-06-04 MED ORDER — OXYTOCIN BOLUS FROM INFUSION: 500.0000 mL | Freq: Once | INTRAVENOUS | Status: DC

## 2019-06-04 MED ORDER — MORPHINE SULFATE (PF) 0.5 MG/ML IJ SOLN
INTRAMUSCULAR | Status: DC | PRN
  Administered 2019-06-04: 3 mg via EPIDURAL

## 2019-06-04 MED ORDER — TERBUTALINE SULFATE 1 MG/ML IJ SOLN
0.2500 mg | Freq: Once | INTRAMUSCULAR | Status: AC | PRN
  Administered 2019-06-04: 0.25 mg via SUBCUTANEOUS
  Filled 2019-06-04: qty 1

## 2019-06-04 MED ORDER — STERILE WATER FOR IRRIGATION IR SOLN
Status: DC | PRN
  Administered 2019-06-04: 1000 mL

## 2019-06-04 MED ORDER — DIPHENHYDRAMINE HCL 25 MG PO CAPS
25.0000 mg | ORAL_CAPSULE | Freq: Four times a day (QID) | ORAL | Status: DC | PRN
  Administered 2019-06-05: 25 mg via ORAL
  Filled 2019-06-04: qty 1

## 2019-06-04 MED ORDER — FENTANYL CITRATE (PF) 100 MCG/2ML IJ SOLN
25.0000 ug | INTRAMUSCULAR | Status: DC | PRN
  Administered 2019-06-04: 50 ug via INTRAVENOUS

## 2019-06-04 MED ORDER — ACETAMINOPHEN 325 MG PO TABS: 650.0000 mg | ORAL_TABLET | ORAL | Status: DC | PRN

## 2019-06-04 MED ORDER — FLEET ENEMA 7-19 GM/118ML RE ENEM: 1.0000 | ENEMA | RECTAL | Status: DC | PRN

## 2019-06-04 MED ORDER — SOD CITRATE-CITRIC ACID 500-334 MG/5ML PO SOLN
30.0000 mL | ORAL | Status: DC | PRN
  Administered 2019-06-04: 30 mL via ORAL
  Filled 2019-06-04: qty 30

## 2019-06-04 MED ORDER — GENTAMICIN SULFATE 40 MG/ML IJ SOLN
INTRAVENOUS | Status: DC | PRN
  Administered 2019-06-04: 400 mg via INTRAVENOUS

## 2019-06-04 MED ORDER — MENTHOL 3 MG MT LOZG
1.0000 | LOZENGE | OROMUCOSAL | Status: DC | PRN
  Administered 2019-06-05: 3 mg via ORAL
  Filled 2019-06-04: qty 9

## 2019-06-04 MED ORDER — OXYCODONE-ACETAMINOPHEN 5-325 MG PO TABS: 2.0000 | ORAL_TABLET | ORAL | Status: DC | PRN

## 2019-06-04 MED ORDER — PHENYLEPHRINE HCL-NACL 20-0.9 MG/250ML-% IV SOLN
INTRAVENOUS | Status: AC
  Filled 2019-06-04: qty 250

## 2019-06-04 MED ORDER — PROPOFOL 10 MG/ML IV BOLUS
INTRAVENOUS | Status: AC
  Filled 2019-06-04: qty 20

## 2019-06-04 MED ORDER — ONDANSETRON HCL 4 MG/2ML IJ SOLN
INTRAMUSCULAR | Status: AC
  Filled 2019-06-04: qty 2

## 2019-06-04 MED ORDER — EPHEDRINE 5 MG/ML INJ
10.0000 mg | INTRAVENOUS | Status: AC | PRN
  Administered 2019-06-04 (×2): 10 mg via INTRAVENOUS

## 2019-06-04 MED ORDER — OXYTOCIN 40 UNITS IN NORMAL SALINE INFUSION - SIMPLE MED: 2.5000 [IU]/h | INTRAVENOUS | Status: AC

## 2019-06-04 MED ORDER — OXYTOCIN 40 UNITS IN NORMAL SALINE INFUSION - SIMPLE MED
INTRAVENOUS | Status: DC | PRN
  Administered 2019-06-04: 40 mL via INTRAVENOUS

## 2019-06-04 MED ORDER — FENTANYL CITRATE (PF) 250 MCG/5ML IJ SOLN
INTRAMUSCULAR | Status: AC
  Filled 2019-06-04: qty 10

## 2019-06-04 MED ORDER — OXYTOCIN 40 UNITS IN NORMAL SALINE INFUSION - SIMPLE MED
2.5000 [IU]/h | INTRAVENOUS | Status: DC
  Filled 2019-06-04: qty 1000

## 2019-06-04 MED ORDER — ZOLPIDEM TARTRATE 5 MG PO TABS: 5.0000 mg | ORAL_TABLET | Freq: Every evening | ORAL | Status: DC | PRN

## 2019-06-04 MED ORDER — COCONUT OIL OIL
1.0000 "application " | TOPICAL_OIL | Status: DC | PRN
  Administered 2019-06-05: 1 via TOPICAL

## 2019-06-04 MED ORDER — MEPERIDINE HCL 25 MG/ML IJ SOLN: 6.2500 mg | INTRAMUSCULAR | Status: DC | PRN

## 2019-06-04 MED ORDER — FENTANYL CITRATE (PF) 100 MCG/2ML IJ SOLN
INTRAMUSCULAR | Status: AC
  Filled 2019-06-04: qty 2

## 2019-06-04 MED ORDER — SODIUM CHLORIDE 0.9 % IV SOLN: INTRAVENOUS | Status: DC | PRN

## 2019-06-04 MED ORDER — PHENYLEPHRINE 40 MCG/ML (10ML) SYRINGE FOR IV PUSH (FOR BLOOD PRESSURE SUPPORT)
80.0000 ug | PREFILLED_SYRINGE | INTRAVENOUS | Status: DC | PRN
  Administered 2019-06-04: 80 ug via INTRAVENOUS
  Filled 2019-06-04: qty 10

## 2019-06-04 MED ORDER — FENTANYL CITRATE (PF) 100 MCG/2ML IJ SOLN: 50.0000 ug | INTRAMUSCULAR | Status: DC | PRN

## 2019-06-04 MED ORDER — OXYTOCIN 40 UNITS IN NORMAL SALINE INFUSION - SIMPLE MED
1.0000 m[IU]/min | INTRAVENOUS | Status: DC
  Administered 2019-06-04: 1 m[IU]/min via INTRAVENOUS

## 2019-06-04 MED ORDER — ACETAMINOPHEN 325 MG PO TABS: 325.0000 mg | ORAL_TABLET | ORAL | Status: DC | PRN

## 2019-06-04 MED ORDER — SODIUM CHLORIDE 0.9 % IR SOLN
Status: DC | PRN
  Administered 2019-06-04: 1000 mL

## 2019-06-04 MED ORDER — SIMETHICONE 80 MG PO CHEW
80.0000 mg | CHEWABLE_TABLET | ORAL | Status: DC | PRN
  Administered 2019-06-04 – 2019-06-06 (×3): 80 mg via ORAL
  Filled 2019-06-04 (×3): qty 1

## 2019-06-04 MED ORDER — LACTATED RINGERS IV SOLN
500.0000 mL | Freq: Once | INTRAVENOUS | Status: AC
  Administered 2019-06-04: 500 mL via INTRAVENOUS

## 2019-06-04 MED ORDER — SODIUM CHLORIDE (PF) 0.9 % IJ SOLN
INTRAMUSCULAR | Status: DC | PRN
  Administered 2019-06-04: 12 mL/h via EPIDURAL

## 2019-06-04 MED ORDER — OXYCODONE-ACETAMINOPHEN 5-325 MG PO TABS
1.0000 | ORAL_TABLET | ORAL | Status: DC | PRN
  Administered 2019-06-04 – 2019-06-05 (×2): 1 via ORAL
  Filled 2019-06-04: qty 2
  Filled 2019-06-04: qty 1

## 2019-06-04 MED ORDER — OXYCODONE-ACETAMINOPHEN 5-325 MG PO TABS
2.0000 | ORAL_TABLET | ORAL | Status: DC | PRN
  Administered 2019-06-05 – 2019-06-06 (×4): 2 via ORAL
  Filled 2019-06-04 (×4): qty 2

## 2019-06-04 MED ORDER — MORPHINE SULFATE (PF) 0.5 MG/ML IJ SOLN
INTRAMUSCULAR | Status: AC
  Filled 2019-06-04: qty 10

## 2019-06-04 MED ORDER — OXYTOCIN 40 UNITS IN NORMAL SALINE INFUSION - SIMPLE MED
INTRAVENOUS | Status: AC
  Filled 2019-06-04: qty 1000

## 2019-06-04 MED ORDER — LACTATED RINGERS IV SOLN: INTRAVENOUS | Status: DC

## 2019-06-04 MED ORDER — LACTATED RINGERS IV SOLN: INTRAVENOUS | Status: DC | PRN

## 2019-06-04 MED ORDER — IBUPROFEN 800 MG PO TABS
800.0000 mg | ORAL_TABLET | Freq: Three times a day (TID) | ORAL | Status: AC
  Administered 2019-06-04 – 2019-06-07 (×9): 800 mg via ORAL
  Filled 2019-06-04 (×9): qty 1

## 2019-06-04 MED ORDER — GENTAMICIN SULFATE 40 MG/ML IJ SOLN
5.0000 mg/kg | Freq: Once | INTRAVENOUS | Status: DC
  Filled 2019-06-04: qty 10

## 2019-06-04 MED ORDER — LIDOCAINE HCL (PF) 1 % IJ SOLN
30.0000 mL | INTRAMUSCULAR | Status: AC | PRN
  Administered 2019-06-04: 6 mL via SUBCUTANEOUS
  Administered 2019-06-04: 7 mL via SUBCUTANEOUS

## 2019-06-04 MED ORDER — DIBUCAINE (PERIANAL) 1 % EX OINT: 1.0000 "application " | TOPICAL_OINTMENT | CUTANEOUS | Status: DC | PRN

## 2019-06-04 MED ORDER — CLINDAMYCIN PHOSPHATE 900 MG/50ML IV SOLN
900.0000 mg | Freq: Once | INTRAVENOUS | Status: AC
  Administered 2019-06-04: 900 mg via INTRAVENOUS

## 2019-06-04 MED ORDER — ACETAMINOPHEN 160 MG/5ML PO SOLN: 325.0000 mg | ORAL | Status: DC | PRN

## 2019-06-04 MED ORDER — DEXTROSE 5 % IV SOLN
INTRAVENOUS | Status: AC
  Filled 2019-06-04: qty 3000

## 2019-06-04 MED ORDER — ONDANSETRON HCL 4 MG/2ML IJ SOLN
4.0000 mg | Freq: Four times a day (QID) | INTRAMUSCULAR | Status: DC | PRN
  Administered 2019-06-04 (×2): 4 mg via INTRAVENOUS
  Filled 2019-06-04: qty 2

## 2019-06-04 MED ORDER — EPHEDRINE 5 MG/ML INJ
10.0000 mg | INTRAVENOUS | Status: DC | PRN
  Administered 2019-06-04: 10 mg via INTRAVENOUS
  Filled 2019-06-04: qty 10

## 2019-06-04 MED ORDER — VANCOMYCIN HCL IN DEXTROSE 1-5 GM/200ML-% IV SOLN
1000.0000 mg | Freq: Two times a day (BID) | INTRAVENOUS | Status: DC
  Administered 2019-06-04: 1000 mg via INTRAVENOUS
  Filled 2019-06-04 (×2): qty 200

## 2019-06-04 MED ORDER — PRENATAL MULTIVITAMIN CH
1.0000 | ORAL_TABLET | Freq: Every day | ORAL | Status: DC
  Administered 2019-06-05 – 2019-06-06 (×2): 1 via ORAL
  Filled 2019-06-04 (×2): qty 1

## 2019-06-04 MED ORDER — DIPHENHYDRAMINE HCL 50 MG/ML IJ SOLN: 12.5000 mg | INTRAMUSCULAR | Status: DC | PRN

## 2019-06-04 MED ORDER — OXYCODONE HCL 5 MG PO TABS: 5.0000 mg | ORAL_TABLET | Freq: Once | ORAL | Status: DC | PRN

## 2019-06-04 MED ORDER — WITCH HAZEL-GLYCERIN EX PADS: 1.0000 "application " | MEDICATED_PAD | CUTANEOUS | Status: DC | PRN

## 2019-06-04 MED ORDER — LACTATED RINGERS IV SOLN
500.0000 mL | INTRAVENOUS | Status: DC | PRN
  Administered 2019-06-04 (×3): 1000 mL via INTRAVENOUS

## 2019-06-04 MED ORDER — PHENYLEPHRINE 40 MCG/ML (10ML) SYRINGE FOR IV PUSH (FOR BLOOD PRESSURE SUPPORT)
80.0000 ug | PREFILLED_SYRINGE | INTRAVENOUS | Status: DC | PRN
  Administered 2019-06-04: 80 ug via INTRAVENOUS

## 2019-06-04 MED ORDER — FENTANYL-BUPIVACAINE-NACL 0.5-0.125-0.9 MG/250ML-% EP SOLN
EPIDURAL | Status: AC
  Filled 2019-06-04: qty 250

## 2019-06-04 MED ORDER — FENTANYL CITRATE (PF) 100 MCG/2ML IJ SOLN
INTRAMUSCULAR | Status: DC | PRN
  Administered 2019-06-04: 100 ug via EPIDURAL

## 2019-06-04 MED ORDER — OXYCODONE HCL 5 MG/5ML PO SOLN: 5.0000 mg | Freq: Once | ORAL | Status: DC | PRN

## 2019-06-04 MED ORDER — SENNOSIDES-DOCUSATE SODIUM 8.6-50 MG PO TABS
2.0000 | ORAL_TABLET | ORAL | Status: DC
  Administered 2019-06-04 – 2019-06-07 (×3): 2 via ORAL
  Filled 2019-06-04 (×3): qty 2

## 2019-06-04 MED ORDER — FENTANYL-BUPIVACAINE-NACL 0.5-0.125-0.9 MG/250ML-% EP SOLN: 12.0000 mL/h | EPIDURAL | Status: DC | PRN

## 2019-06-04 SURGICAL SUPPLY — 41 items
ADH SKN CLS APL DERMABOND .7 (GAUZE/BANDAGES/DRESSINGS)
APL SKNCLS STERI-STRIP NONHPOA (GAUZE/BANDAGES/DRESSINGS) ×1
BENZOIN TINCTURE PRP APPL 2/3 (GAUZE/BANDAGES/DRESSINGS) ×2 IMPLANT
CHLORAPREP W/TINT 26ML (MISCELLANEOUS) ×2 IMPLANT
CLAMP CORD UMBIL (MISCELLANEOUS) IMPLANT
CLOSURE STERI STRIP 1/2 X4 (GAUZE/BANDAGES/DRESSINGS) ×2 IMPLANT
CLOTH BEACON ORANGE TIMEOUT ST (SAFETY) ×2 IMPLANT
DERMABOND ADVANCED (GAUZE/BANDAGES/DRESSINGS)
DERMABOND ADVANCED .7 DNX12 (GAUZE/BANDAGES/DRESSINGS) IMPLANT
DRAPE C SECTION CLR SCREEN (DRAPES) ×2 IMPLANT
DRSG OPSITE POSTOP 4X10 (GAUZE/BANDAGES/DRESSINGS) ×2 IMPLANT
ELECT REM PT RETURN 9FT ADLT (ELECTROSURGICAL) ×2
ELECTRODE REM PT RTRN 9FT ADLT (ELECTROSURGICAL) ×1 IMPLANT
EXTRACTOR VACUUM M CUP 4 TUBE (SUCTIONS) IMPLANT
GAUZE SPONGE 4X4 12PLY STRL LF (GAUZE/BANDAGES/DRESSINGS) ×2 IMPLANT
GLOVE BIOGEL PI IND STRL 7.0 (GLOVE) ×2 IMPLANT
GLOVE BIOGEL PI INDICATOR 7.0 (GLOVE) ×2
GLOVE SURG SS PI 6.5 STRL IVOR (GLOVE) ×2 IMPLANT
GOWN STRL REUS W/TWL LRG LVL3 (GOWN DISPOSABLE) ×4 IMPLANT
KIT ABG SYR 3ML LUER SLIP (SYRINGE) IMPLANT
NEEDLE HYPO 25X5/8 SAFETYGLIDE (NEEDLE) IMPLANT
NS IRRIG 1000ML POUR BTL (IV SOLUTION) ×2 IMPLANT
PACK C SECTION WH (CUSTOM PROCEDURE TRAY) ×2 IMPLANT
PAD ABD 7.5X8 STRL (GAUZE/BANDAGES/DRESSINGS) ×4 IMPLANT
PAD OB MATERNITY 4.3X12.25 (PERSONAL CARE ITEMS) ×2 IMPLANT
PENCIL SMOKE EVAC W/HOLSTER (ELECTROSURGICAL) ×2 IMPLANT
RTRCTR C-SECT PINK 25CM LRG (MISCELLANEOUS) ×2 IMPLANT
SUT CHROMIC 1 CTX 36 (SUTURE) IMPLANT
SUT CHROMIC 2 0 CT 1 (SUTURE) ×2 IMPLANT
SUT MON AB 4-0 PS1 27 (SUTURE) ×2 IMPLANT
SUT PLAIN 1 NONE 54 (SUTURE) IMPLANT
SUT PLAIN 2 0 (SUTURE)
SUT PLAIN 2 0 XLH (SUTURE) ×2 IMPLANT
SUT PLAIN ABS 2-0 CT1 27XMFL (SUTURE) IMPLANT
SUT VIC AB 0 CTX 36 (SUTURE) ×4
SUT VIC AB 0 CTX36XBRD ANBCTRL (SUTURE) ×2 IMPLANT
SUT VIC AB 1 CTX 36 (SUTURE) ×6
SUT VIC AB 1 CTX36XBRD ANBCTRL (SUTURE) ×3 IMPLANT
TOWEL OR 17X24 6PK STRL BLUE (TOWEL DISPOSABLE) ×2 IMPLANT
TRAY FOLEY W/BAG SLVR 14FR LF (SET/KITS/TRAYS/PACK) ×2 IMPLANT
WATER STERILE IRR 1000ML POUR (IV SOLUTION) ×2 IMPLANT

## 2019-06-04 NOTE — MAU Note (Signed)
Patient reports to MAU c/o ctx every 2-6 min. No bleeding or LOF. +FM.
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
## 2019-06-04 NOTE — Brief Op Note (Signed)
06/04/2019  2:43 PM  PATIENT:  Elizabeth [name]  28 y.o. female  PRE-OPERATIVE DIAGNOSIS:  primary cesarean for abnormal fetal heart rate tracing  POST-OPERATIVE DIAGNOSIS:  primary cesarean for abnormal fetal heart rate tracing  PROCEDURE:  Procedure(s): CESAREAN SECTION (N/A)  SURGEON:  Surgeon(s) and Role:    * Waymon Amato, MD - Primary  ASSISTANTS: None.  SCRUB TECH: [place]  ANESTHESIA:   epidural  EBL:  732cc   BLOOD ADMINISTERED:none  DRAINS: none   LOCAL MEDICATIONS USED:  NONE  SPECIMEN:  Source of Specimen:  Placenta to pathology.  Cord blood to Lab. Umbilical Cord [place] labs to laboratory.    DISPOSITION OF SPECIMEN:  PATHOLOGY  COUNTS:  YES  TOURNIQUET:  * No tourniquets in log *  DICTATION: .Note written in EPIC  PLAN OF CARE: Admit to inpatient   PATIENT DISPOSITION:  PACU - hemodynamically stable.   Delay start of Pharmacological VTE agent (>24hrs) due to surgical blood loss or risk of bleeding: not applicable.  Dr. Alesia Richards.  06/04/19.

## 2019-06-04 NOTE — Transfer of Care (Signed)
Immediate Anesthesia Transfer of Care Note  Patient: Elizabeth [name]  Procedure(s) Performed: CESAREAN SECTION (N/A )  Patient Location: PACU  Anesthesia Type:Epidural  Level of Consciousness: awake  Airway & Oxygen Therapy: Patient Spontanous Breathing  Post-op Assessment: Report given to RN and Post -op Vital signs reviewed and stable  Post vital signs: stable  Last Vitals:  Vitals Value Taken Time  BP 111/78 06/04/19 1439  Temp    Pulse 144 06/04/19 1443  Resp 34 06/04/19 1443  SpO2 100 % 06/04/19 1443  Vitals shown include unvalidated device data.  Last Pain:  Vitals:   06/04/19 1231  TempSrc:   PainSc: 0-No pain      Patients Stated Pain Goal: 0 (00/93/81 8299)  Complications: No apparent anesthesia complications

## 2019-06-04 NOTE — Anesthesia Procedure Notes (Signed)
Epidural Patient location during procedure: OB Start time: 06/04/2019 4:33 AM End time: 06/04/2019 4:35 AM  Staffing Anesthesiologist: Lyn Hollingshead, MD Performed: anesthesiologist   Preanesthetic Checklist Completed: patient identified, IV checked, site marked, risks and benefits discussed, surgical consent, monitors and equipment checked, pre-op evaluation and timeout performed  Epidural Patient position: sitting Prep: DuraPrep and site prepped and draped Patient monitoring: continuous pulse ox and blood pressure Approach: midline Location: L3-L4 Injection technique: LOR air  Needle:  Needle type: Tuohy  Needle gauge: 17 G Needle length: 9 cm and 9 Needle insertion depth: 7 cm Catheter type: closed end flexible Catheter size: 19 Gauge Catheter at skin depth: 12 cm Test dose: negative and Other  Assessment Events: blood not aspirated, injection not painful, no injection resistance, no paresthesia and negative IV test  Additional Notes Reason for block:procedure for pain

## 2019-06-04 NOTE — Anesthesia Procedure Notes (Addendum)
Date/Time: 06/04/2019 1:33 PM Performed by: Ignacia Bayley, CRNA Pre-anesthesia Checklist: Patient identified, Emergency Drugs available, Suction available, Timeout performed and Patient being monitored Oxygen Delivery Method: Nasal cannula Placement Confirmation: positive ETCO2

## 2019-06-04 NOTE — Progress Notes (Addendum)
At 2030 I did patient's third 1 hour check and HR was 140, blood pressure 97/53. Pt states she feels fine. No dizziness or nausea. Fundus is firm and bleeding scant. Patient hydrating well.  I called midwife Burman Foster to inform her of patient vital signs. She said she would come to evaluate the patient.  No further orders given at this time

## 2019-06-04 NOTE — Anesthesia Postprocedure Evaluation (Signed)
Anesthesia Post Note  Patient: Alisa Graff  Procedure(s) Performed: CESAREAN SECTION (N/A )     Patient location during evaluation: Mother Baby Anesthesia Type: Epidural Level of consciousness: awake and alert Pain management: pain level controlled Vital Signs Assessment: post-procedure vital signs reviewed and stable Respiratory status: spontaneous breathing, nonlabored ventilation and respiratory function stable Cardiovascular status: stable Postop Assessment: no headache, no backache and epidural receding Anesthetic complications: no    Last Vitals:  Vitals:   06/04/19 1615 06/04/19 1620  BP: (!) 114/57   Pulse: (!) 137   Resp: (!) 24 (!) 31  Temp:    SpO2: 100%     Last Pain:  Vitals:   06/04/19 1612  TempSrc:   PainSc: 7    Pain Goal: Patients Stated Pain Goal: 0 (06/04/19 0057)                 Diahann Guajardo

## 2019-06-04 NOTE — Op Note (Addendum)
Patient: Elizabeth [name] DOB: 12-25-1990 MRN:  [id number]  DATE OF SURGERY: 06/04/2019  PREOP DIAGNOSIS:  1. 28 y/o G2P0 at 40 weeks 2 days in labor.  2. Abnormal fetal heart tracing with recurrent deep variables to 50s and prolonged decelerations.  3. Remote from delivery at 9 cm dilation.     POSTOP DIAGNOSIS: Same as above.  PROCEDURE: Urgent Primary low uterine segment transverse cesarean section via Pfannenstiel incision.     SURGEON: Dr.  Waymon Amato  ASSISTANT: None  ANESTHESIA: Bolused epidural  COMPLICATIONS: None  FINDINGS: Viable female infant in cephalic presentation, DOP, weight 6 lbs 8.8 oz, Apgar scores of 5 and 9. Normal uterus and fallopian tubes and ovaries bilaterally.    EBL:  732 cc  IV FLUID:  Per anesthesia   URINE OUTPUT: 75 cc concentrated urine  INDICATIONS:   28 y/o P0 who presented complaining of contractions.  She proceeded to 5cm dilation and did not change further.  Pitocin was started for labor augmentation.  At 2 millli units of pitocin patient started having recurrent late, variable and prolonged decelerations.  Pitocin was stopped and fetal resuscitation measures taken but fetal heart tracing remained abnormal with recurrent deep variables and prolonged decelerations.  She had then progressed to 9 cm with an anterior lip.  Patient attempted to push past the lip unsuccessfully.  It was decided to proceed with a cesarean delivery for concern of fetal status.  She was consented for the procedure after explaining risks benefits and alternatives of the procedure.    PROCEDURE:   Informed consent was obtained from the patient to undergo the procedure. She was taken to the operating room where her spinal anesthesia was found to be adequate. Betadine was used for prep and she was then draped in the usual sterile fashion and a Foley catheter was placed. She received gentamicin and clindamycin at beginning of the procedure.  A Pfannenstiel incision was made  with the scalpel and the incision extended through the subcutaneous layer and also the fascia with the bovie. Small perforators in the subcutaneous layer were contained with the Bovie. The fascia was nicked in the midline and then was further separated from the rectus muscles bilaterally using Mayo scissors. Kochers were placed inferiorly and then superiorly to allow further separation of fascia from the rectus muscles.  The peritoneal cavity was entered bluntly with the fingers. The Alexis retractor was placed in. The bladder flap was not created.    The uterus was incised with a scalpel about 2cm above the uterovesical fold and the incision extended bluntly bilaterally with fingers and bandage scisors. Copious meconium stained fluid was noted.  The head then the rest of the body was then delivered with abdominal pressure.  She delivered a viable female infant, apgar scores 5, 9.  The cord was clamped and cut quickly at less than 1 minute. Cord blood was collected.    The uterus was not exteriorized.  The placenta was delivered with gentle traction on the umbilical cord. The edges of the uterus was grasped with T clamps. The uterus was cleared of clots and debris with a lap.  The uterine incision was closed with #1 Vicryl in a running locked stitch. An imbricating layer of the same stitch was placed over the initial closure.  A small area that bled on the left side was contained with figure of 8 stitch.  Irrigation was applied and suctioned out. Excellent hemostasis was noted over the incision.  The muscles  and peritoneum were then reapproximated using chromic suture.  Fascia was closed using 0 Vicryl in a running stitch. The subcutaneous layer was irrigated and suctioned out. Small perforators were contained with the bovie.  The subcutaneous layer was closed using 1-0 plain in interrupted stitches. The skin was closed using 4-0 Monocryl. Benzocaine and steri strips were applied.  Honeycomb was then applied.  The patient was then cleaned and she was taken to the recovery room with her baby in stable conditions.   SPECIMEN: Placenta to pathology, umbilical cord blood to lab.   DISPOSITION: TO PACU, STABLE.   Dr. Sallye Ober.

## 2019-06-04 NOTE — Progress Notes (Signed)
Subjective:    Comfortable w/ epidural  Objective:    VS: BP (!) 103/54   Pulse (!) 106   Temp 98.5 F (36.9 C) (Oral)   Resp 18   Ht 5\' 6"  (1.676 m)   Wt 111.6 kg   LMP 08/25/2018 (Exact Date)   SpO2 99%   BMI 39.71 kg/m  FHR : baseline 145 / variability moderate / accelerations present / late decelerations IUPC: MVU assessing Membranes: light mec fluid when AROm @ 0531, now moderate mec stained fluid  Dilation: 4 Effacement (%): 90 Station: -2 Presentation: Vertex Exam by:: Burman Foster, CNM  Assessment/Plan:   28 y.o. G2P0010 [redacted]w[redacted]d  Labor: IUPC assessing, will determine need for Pitocin  Preeclampsia:  Repeat PCR from cath urine sent for testing Fetal Wellbeing:  Category II Pain Control:  Epidural I/D:  GBS positive, Vancomycin x1 dose completed Anticipated MOD:  NSVD   Dr. Alesia Richards updated on IUPC placement, moderate mec stained fluid, and fetal surveillance  Arrie Eastern CNM, MSN 06/04/2019 8:05 AM

## 2019-06-04 NOTE — Lactation Note (Signed)
This note was copied from a baby's chart. Lactation Consultation Note  Patient Name: Elizabeth [name] POEUM'P Date: 06/04/2019 Reason for consult: Initial assessment   P1 mom with support person, (maternal grandmother) in room.  +DAT  Mom eager about bf.LC suggested STS often and hand exp often. Upon exam mom has wide spaced breasts, +breast changes during pregnancy, semi flat nipples with shorter shaft.  Mom requests assistance with feed.   Infant placed STS, hand exp. Reviewed.  Mom returned demonstration.  LC was able to collect 28ml into spoon. This was finger fed to infant by LC.  Infant has lingual frenulum visible.  Mom attempted to latch in football without success.  Laid back position attempted.  [facility] taught grandmother how to sandwich breast tissue; latch was achieved and a few swallows heard.  Infant did slip off after a few minutes and mom needed assistance getting infant latched back to the breast.  LC suggested hand expressing prior to feeding and collecting EBM into spoon or container and feeding this back to infant.  DEBP set up.  Parts, cleaning, set up, and milk storage reviewed with family.  Flange size 21 attemptedon right side, but 24 is correct fit and most comfortable for mom.  Mom was encouraged to pump every 2-3, 8 times in 24 hours.  Mom pumped for 15 minutes; no droplets seen at this time.  LC encouraged her to hand express.  LC encouraged mom to call out for assistance getting infant latched if needed.   Maternal Data Has patient been taught Hand Expression?: Yes Does the patient have breastfeeding experience prior to this delivery?: No  Feeding Feeding Type: Breast Fed  LATCH Score Latch: Repeated attempts needed to sustain latch, nipple held in mouth throughout feeding, stimulation needed to elicit sucking reflex.  Audible Swallowing: A few with stimulation  Type of Nipple: Flat(shorter shaft; semi flat)  Comfort (Breast/Nipple): Filling, red/small  blisters or bruises, mild/mod discomfort(right side tender/ slightly pink)  Hold (Positioning): Assistance needed to correctly position infant at breast and maintain latch.  LATCH Score: 5  Interventions Interventions: Breast feeding basics reviewed;Assisted with latch;Skin to skin;Breast massage;Hand express;Position options;DEBP;Support pillows;Adjust position;Breast compression  Lactation Tools Discussed/Used Tools: Pump Breast pump type: Double-Electric Breast Pump WIC Program: Yes Pump Review: Setup, frequency, and cleaning;Milk Storage Initiated by:: Rhea Bleacher Date initiated:: 06/05/19   Consult Status Consult Status: Follow-up Date: 06/05/19 Follow-up type: In-patient    Ferne Coe [facility] 06/04/2019, 11:47 PM

## 2019-06-04 NOTE — Anesthesia Preprocedure Evaluation (Signed)
Anesthesia Evaluation  Patient identified by MRN, date of birth, ID band Patient awake    Reviewed: Allergy & Precautions, H&P , NPO status , Patient's Chart, lab work & pertinent test results  Airway Mallampati: II  TM Distance: >3 FB Neck ROM: full    Dental no notable dental hx. (+) Chipped, Teeth Intact   Pulmonary Current Smoker, former smoker,    Pulmonary exam normal breath sounds clear to auscultation       Cardiovascular negative cardio ROS Normal cardiovascular exam Rhythm:regular Rate:Normal     Neuro/Psych PSYCHIATRIC DISORDERS Anxiety Bipolar Disorder negative neurological ROS     GI/Hepatic negative GI ROS, Neg liver ROS, (+)       alcohol use, cocaine use and marijuana use,   Endo/Other  negative endocrine ROS  Renal/GU negative Renal ROS  negative genitourinary   Musculoskeletal   Abdominal Normal abdominal exam  (+)   Peds negative pediatric ROS (+)  Hematology negative hematology ROS (+)   Anesthesia Other Findings   Reproductive/Obstetrics (+) Pregnancy                             Anesthesia Physical  Anesthesia Plan  ASA: II  Anesthesia Plan: Epidural   Post-op Pain Management:    Induction:   PONV Risk Score and Plan:   Airway Management Planned:   Additional Equipment:   Intra-op Plan:   Post-operative Plan:   Informed Consent: I have reviewed the patients History and Physical, chart, labs and discussed the procedure including the risks, benefits and alternatives for the proposed anesthesia with the patient or authorized representative who has indicated his/her understanding and acceptance.       Plan Discussed with:   Anesthesia Plan Comments:         Anesthesia Quick Evaluation

## 2019-06-04 NOTE — Progress Notes (Addendum)
S: Elizabeth [name], 28 y.o., G2P0010, 100w3d presents to MAU w/ c/o ctx since 2145 on 06/03/19, Has tried Tylenol and PO hydration, w/o relief. Reports active FM and denies LOF  O: BP 135/86 (BP Location: Right Arm)   Pulse (!) 110   Temp 98.2 F (36.8 C)   Resp 20   LMP 08/25/2018 (Exact Date)   SpO2 100%    Labs UA and PCR pending  Past medical history:  Past Medical History:  Diagnosis Date  . Anxiety   . Bipolar 1 disorder ([place])   . Chlamydia infection 2005, 2008  . Recurrent vaginitis     Past surgical history:  Past Surgical History:  Procedure Laterality Date  . ABSCESS DRAINAGE     left groin  . HEEL SPUR SURGERY    . INCISION AND DRAINAGE ABSCESS Left 03/03/2016   Procedure: INCISION AND DRAINAGE ABSCESS;  Surgeon: Jules Husbands, MD;  Location: [facility] ORS;  Service: General;  Laterality: Left;  . right foot surgery    . TONSILLECTOMY AND ADENOIDECTOMY     Family History:  Family History  Problem Relation Age of Onset  . Hypertension Mother   . Thyroid cancer Mother   . Hypertension Father   . Alcohol abuse Paternal Grandfather   . Hypertension Maternal Grandmother   . Cancer Maternal Grandmother   . Heart Problems Paternal Grandmother     Social History:  reports that she has quit smoking. Her smoking use included cigarettes. She smoked 0.50 packs per day. She has never used smokeless tobacco. She reports previous alcohol use of about 1.0 standard drinks of alcohol per week. She reports previous drug use. Drug: Marijuana.  Physical exam: Dilation: 2 Effacement (%): 50 Station: -3 Exam by:: Lauren Cox RN   AAO x3 Abd: gravid, soft, active FM GU: negative for bloody show, negative for LOF  A: Elizabeth [name] 28 y.o. G2P0010 [redacted]w[redacted]d Latent labor Cat I fetal surveillance  P: Ambulate and repeat VE 2 hours from last exam UA and PCR Admit if cervical change or SROM   Arrie Eastern MSN, CNM 06/04/2019 2:07 AM

## 2019-06-04 NOTE — Progress Notes (Signed)
Elizabeth [name] is a 28 y.o. G2P0010 at [redacted]w[redacted]d in labor.   Subjective: Patient comfortable with the epidural.    Objective: BP (!) 118/59   Pulse (!) 114   Temp 99.4 F (37.4 C) (Oral)   Resp 18   Ht 5\' 6"  (1.676 m)   Wt 111.6 kg   LMP 08/25/2018 (Exact Date)   SpO2 99%   BMI 39.71 kg/m  No intake/output data recorded. No intake/output data recorded.  FHT:  FHR: 150 bpm, variability: moderate,  accelerations:  Present,  decelerations:  Absent UC:   regular, every 3 to 4  minutes SVE:   Dilation: 5.5 Effacement (%): 80(edematous) Station: -1 Exam by:: Curly [place], RN  Exam confirmed by me.   Labs: Lab Results  Component Value Date   WBC 8.1 06/04/2019   HGB 14.0 06/04/2019   HCT 39.6 06/04/2019   MCV 92.5 06/04/2019   PLT 174 06/04/2019   CMP     Component Value Date/Time   NA 138 04/12/2018 1118   K 3.8 04/12/2018 1118   CL 109 04/12/2018 1118   CO2 24 04/12/2018 1118   GLUCOSE 92 04/12/2018 1118   BUN 6 04/12/2018 1118   CREATININE 0.65 04/12/2018 1118   CALCIUM 9.3 04/12/2018 1118   PROT 7.1 04/12/2018 1118   ALBUMIN 4.2 04/12/2018 1118   AST 17 04/12/2018 1118   ALT 8 04/12/2018 1118   ALKPHOS 45 04/12/2018 1118   BILITOT 0.7 04/12/2018 1118   GFRNONAA >60 07/17/2017 1843   GFRAA >60 07/17/2017 1843   Protein / creatinine ratio, urine Order: [id number] Status:  Final result Visible to patient:  Yes (MyChart) Next appt:  None  Ref Range & Units 1 d ago  (06/04/19) 1 d ago  (06/04/19)  Creatinine, Urine mg/dL 116.37  94.37   Total Protein, Urine mg/dL 61  71 CM   Comment: NO NORMAL RANGE ESTABLISHED FOR THIS TEST  Protein Creatinine Ratio 0.00 - 0.15 mg/mg 0.52High   0.75High  CM   Comment: Performed at [facility], [place] [address], [place], [place] [address]         Assessment / Plan: Spontaneous labor without recent cervical change.   Labor: Start pitocin for labor augmentation.  We discussed risks, benefits and alternatives  pitocin labor augmentation. Preeclampsia:  Preeclampsia without severe features.  Fetal Wellbeing:  Category I Pain Control:  Epidural I/D:  GBS positive on vancomycin Anticipated MOD:  NSVD  Alinda Dooms, MD.   06/04/2019, 12:01 PM

## 2019-06-05 DIAGNOSIS — D62 Acute posthemorrhagic anemia: Secondary | ICD-10-CM | POA: Diagnosis not present

## 2019-06-05 DIAGNOSIS — O1400 Mild to moderate pre-eclampsia, unspecified trimester: Secondary | ICD-10-CM

## 2019-06-05 HISTORY — DX: Mild to moderate pre-eclampsia, unspecified trimester: O14.00

## 2019-06-05 LAB — CBC
HCT: 31 % — ABNORMAL LOW (ref 36.0–46.0)
Hemoglobin: 10.7 g/dL — ABNORMAL LOW (ref 12.0–15.0)
MCH: 32.5 pg (ref 26.0–34.0)
MCHC: 34.5 g/dL (ref 30.0–36.0)
MCV: 94.2 fL (ref 80.0–100.0)
Platelets: 130 10*3/uL — ABNORMAL LOW (ref 150–400)
RBC: 3.29 MIL/uL — ABNORMAL LOW (ref 3.87–5.11)
RDW: 14.2 % (ref 11.5–15.5)
WBC: 18.2 10*3/uL — ABNORMAL HIGH (ref 4.0–10.5)
nRBC: 0 % (ref 0.0–0.2)

## 2019-06-05 LAB — COMPREHENSIVE METABOLIC PANEL
ALT: 21 U/L (ref 0–44)
AST: 41 U/L (ref 15–41)
Albumin: 2.3 g/dL — ABNORMAL LOW (ref 3.5–5.0)
Alkaline Phosphatase: 138 U/L — ABNORMAL HIGH (ref 38–126)
Anion gap: 9 (ref 5–15)
BUN: 8 mg/dL (ref 6–20)
CO2: 17 mmol/L — ABNORMAL LOW (ref 22–32)
Calcium: 8.6 mg/dL — ABNORMAL LOW (ref 8.9–10.3)
Chloride: 111 mmol/L (ref 98–111)
Creatinine, Ser: 1.02 mg/dL — ABNORMAL HIGH (ref 0.44–1.00)
GFR calc Af Amer: >60 mL/min (ref >60)
GFR calc non Af Amer: >60 mL/min (ref >60)
Glucose, Bld: 79 mg/dL (ref 70–99)
Potassium: 3.8 mmol/L (ref 3.5–5.1)
Sodium: 137 mmol/L (ref 135–145)
Total Bilirubin: 1.3 mg/dL — ABNORMAL HIGH (ref 0.3–1.2)
Total Protein: 5.4 g/dL — ABNORMAL LOW (ref 6.5–8.1)

## 2019-06-05 MED ORDER — LACTATED RINGERS IV BOLUS
1000.0000 mL | Freq: Once | INTRAVENOUS | Status: AC
  Administered 2019-06-05: 1000 mL via INTRAVENOUS

## 2019-06-05 MED ORDER — POLYSACCHARIDE IRON COMPLEX 150 MG PO CAPS
150.0000 mg | ORAL_CAPSULE | Freq: Every day | ORAL | Status: DC
  Administered 2019-06-05 – 2019-06-06 (×2): 150 mg via ORAL
  Filled 2019-06-05 (×2): qty 1

## 2019-06-05 MED ORDER — MAGNESIUM OXIDE 400 (241.3 MG) MG PO TABS
400.0000 mg | ORAL_TABLET | Freq: Every day | ORAL | Status: DC
  Administered 2019-06-05 – 2019-06-06 (×2): 400 mg via ORAL
  Filled 2019-06-05 (×2): qty 1

## 2019-06-05 MED ORDER — LACTATED RINGERS IV BOLUS
500.0000 mL | Freq: Once | INTRAVENOUS | Status: AC
  Administered 2019-06-05: 500 mL via INTRAVENOUS

## 2019-06-05 MED ORDER — SALINE SPRAY 0.65 % NA SOLN
1.0000 | NASAL | Status: DC | PRN
  Administered 2019-06-06: 1 via NASAL
  Filled 2019-06-05: qty 44

## 2019-06-05 NOTE — Progress Notes (Signed)
Burman Foster in room at this time. Assessed pt VS, auscultated pulse, output, and bleeding. She says pt is hypovolemic and ordered a fluid bolus of 1000 mg and to saline lock after. She asked to call if HR gets above 140.

## 2019-06-05 NOTE — Addendum Note (Signed)
Addendum  created 06/05/19 1629 by Janeece Riggers, MD   Intraprocedure Staff edited

## 2019-06-05 NOTE — Progress Notes (Addendum)
Subjective: POD# 1 Information for the patient's newborn:  Alva, Kuenzel [[id number]]  female    Circumcision plans out-patient w/ [facility]  Reports feeling tired, but good Feeding: breast Reports tolerating PO and denies N/V, foley removed, ambulating and urinating w/o difficulty  Pain controlled with PO meds Denies HA/SOB/dizziness  Flatus present Vaginal bleeding is normal, no clots     Objective:  VS:  Vitals:   06/04/19 2030 06/04/19 2340 06/05/19 0232 06/05/19 0540  BP: (!) 97/53 (!) 113/54 (!) 97/55 (!) 112/55  Pulse: (!) 140 (!) 130 (!) 121 (!) 112  Resp: 18 18 19 16   Temp: 98.4 F (36.9 C) 99.1 F (37.3 C) 98.9 F (37.2 C) 98.6 F (37 C)  TempSrc: Axillary Oral  Oral  SpO2:  98% 98% 99%  Weight:      Height:        Intake/Output Summary (Last 24 hours) at 06/05/2019 0637 Last data filed at 06/05/2019 0540 Gross per 24 hour  Intake 2175 ml  Output 4282 ml  Net -2107 ml     Recent Labs    06/04/19 0310 06/05/19 0523  WBC 8.1 18.2*  HGB 14.0 10.7*  HCT 39.6 31.0*  PLT 174 130*    Blood type: --/--/O POS, O POS Performed at [facility], [place] [address] [place], [place] [address]  [phone]) Rubella: Immune (06/23 0000)    Physical Exam:  General: alert, cooperative and no distress CV: rhythm regular, rate tachycardic, no additional sounds Resp: clear Abdomen: soft, nontender, normal bowel sounds Incision: clean, dry, intact and pressure dressing removed Perineum:  Uterine Fundus: firm, below umbilicus, nontender Lochia: minimal and no clots Ext: trace edema, negative for pain, calf tenderness, or cords    Assessment/Plan: 28 y.o.   POD# 1 [id number] S/P Primary Cesarean section for abnormal FHR surveillance  Pre-eclampsia w/o severe features    -normotensive    -cath urine PCR 0.52                Tachycardic    -suspect hypovolemia    -HR reduced from 140 to 112 after IV fluid bolus    -afebrile Acute blood loss anemia    -start  Niferex 150 PO daily  Routine post-op PP care          Advised warm fluids and ambulation to improve GI motility Encourage rest when baby rests Breastfeeding support Anticipate discharge on POD#2   Arrie Eastern, MSN, CNM 06/05/2019, 6:37 AM

## 2019-06-05 NOTE — Lactation Note (Signed)
This note was copied from a baby's chart. Lactation Consultation Note  Patient Name: Boy Azaela Caracci KGMWN'U Date: 06/05/2019 Reason for consult: Follow-up assessment;Term;Primapara;1st time breastfeeding  P1 mother whose infant is now 24 hours old.  This is a term baby.  Pediatrician discussed ABO incompatibility with mother this morning.    Mother feels like she has been breast feeding fairly well.  When questioned about feeding STS, she has not been doing this.  I suggested always feeding STS.  Mother has been pumping before/after feeding baby.  Re-educated her on breast feeding basics and suggested she only pump after breast feeding.  Mother verbalized understanding.  She is familiar with hand expression and can obtain colostrum drops.  Reminded her to feed back any EBM she obtains to baby.    Per previous LC note and MD note, baby has a lingual frenulum but it has not interfered with latching as of now.  Baby was asleep in the bassinet when I arrived and I did not assess further.  Mother denied pain with latching.  Encouraged to feed at least every three hours and sooner if baby shows feeding cues.  Mother stated she was trying to awaken him every hour for feeding.  Reviewed cues and suggested she allow him time to sleep between feedings but to make sure he feeds at least every three hours.  Discussed how baby should awaken more tonight and may cluster feed.  Stressed the importance of feeding and mother may need to supplement later today/tonight.  Suggested she call for latch assistance as needed.     Maternal Data    Feeding Feeding Type: Breast Fed  LATCH Score Latch: Grasps breast easily, tongue down, lips flanged, rhythmical sucking.  Audible Swallowing: Spontaneous and intermittent  Type of Nipple: Everted at rest and after stimulation  Comfort (Breast/Nipple): Soft / non-tender  Hold (Positioning): Assistance needed to correctly position infant at breast and maintain  latch.  LATCH Score: 9  Interventions    Lactation Tools Discussed/Used     Consult Status Consult Status: Follow-up Date: 06/06/19 Follow-up type: In-patient    Ala Capri R Cheston Coury 06/05/2019, 2:18 PM

## 2019-06-05 NOTE — Progress Notes (Signed)
Subjective: [place] is a G2P2 POD #1 for NRFHT. Mild preeclampsia, no medications required.  Hgb 14.0-10.7. Mild tachycardia, improved with bolus. All along pt denies any associated symptoms Circumcision plans out-patient w/ [facility] Reports non productive cough that started yesterday in the OR. Lozenges helping a lot.  Notes she is a prior smoker with chronic allergies. She has recently discontinued her medication and would like something to help with her nasal symptoms. Reports feeling tired, but overall "really good" Feeding: breast Reports tolerating PO and denies N/V, foley removed, ambulating and urinating w/o difficulty  Pain controlled with PO meds Denies HA/SOB/dizziness  Flatus present Vaginal bleeding is normal, no clots   Mother present at bedside   Objective:  VS:  Vitals:   06/05/19 0540 06/05/19 1030 06/05/19 1445 06/05/19 1834  BP: (!) 112/55 (!) 105/57 (!) 123/97   Pulse: (!) 112 (!) 128 (!) 128 (!) 127  Resp: 16 (!) 22 20   Temp: 98.6 F (37 C) 98 F (36.7 C) 99.1 F (37.3 C)   TempSrc: Oral Oral Oral   SpO2: 99% 100% 99% 99%  Weight:      Height:        Intake/Output Summary (Last 24 hours) at 06/05/2019 2002 Last data filed at 06/05/2019 1300 Gross per 24 hour  Intake --  Output 2975 ml  Net -2975 ml     Recent Labs    06/04/19 0310 06/05/19 0523  WBC 8.1 18.2*  HGB 14.0 10.7*  HCT 39.6 31.0*  PLT 174 130*      Physical Exam:  General: alert, cooperative and no distress CV: rhythm regular, rate tachycardic, no additional sounds Resp: RRR, clear bilaterally, non labored, no congestion or cough noted Abdomen: soft, nontender, normal bowel sounds Incision: clean, dry, intact and pressure dressing removed Uterine Fundus: firm, below umbilicus, nontender Lochia: minimal and no clots Ext: trace edema, negative for pain, calf tenderness, or cords    Assessment/Plan: 28 y.o.   POD# 1 G2P2 S/P Primary Cesarean section for abnormal FHR  surveillance  Pre-eclampsia w/o severe features    -normotensive    -cath urine PCR 0.52                Tachycardic    -suspect hypovolemia    -HR reduced from 140 to 112 after IV fluid bolus    -afebrile    -asymptomatic Acute blood loss anemia    -start Niferex 150 PO daily  Routine post-op PP care          Advised warm fluids and ambulation to improve GI motility Encourage rest when baby rests Breastfeeding support Repeat CBC in AM Encouraged incentive spirometer Nasal spray for chronic allergies, continue lozenges Anticipate discharge on POD#2   Ike Bene, MSN, CNM 06/05/2019, 8:02 PM

## 2019-06-05 NOTE — Progress Notes (Signed)
IV occluded. NSL removed. Site intact. Patient commits to drinking fluid and she is having her mother bring Gatorade. Discussed drinking about a half cup of Gatorade, water or juice every hour while awake. Patient is having incisional pain and just informed her baby needs to go under phototherapy. Pulse is 127 and patient tearful. Denies palpitations or SOB. When her mother arrives, patient plans to rest.
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
## 2019-06-06 ENCOUNTER — Inpatient Hospital Stay (HOSPITAL_COMMUNITY): Payer: Medicaid Other

## 2019-06-06 LAB — COMPREHENSIVE METABOLIC PANEL
ALT: 25 U/L (ref 0–44)
AST: 43 U/L — ABNORMAL HIGH (ref 15–41)
Albumin: 2.3 g/dL — ABNORMAL LOW (ref 3.5–5.0)
Alkaline Phosphatase: 142 U/L — ABNORMAL HIGH (ref 38–126)
Anion gap: 7 (ref 5–15)
BUN: 5 mg/dL — ABNORMAL LOW (ref 6–20)
CO2: 19 mmol/L — ABNORMAL LOW (ref 22–32)
Calcium: 8.5 mg/dL — ABNORMAL LOW (ref 8.9–10.3)
Chloride: 109 mmol/L (ref 98–111)
Creatinine, Ser: 0.72 mg/dL (ref 0.44–1.00)
GFR calc Af Amer: >60 mL/min (ref >60)
GFR calc non Af Amer: >60 mL/min (ref >60)
Glucose, Bld: 80 mg/dL (ref 70–99)
Potassium: 3.5 mmol/L (ref 3.5–5.1)
Sodium: 135 mmol/L (ref 135–145)
Total Bilirubin: 0.8 mg/dL (ref 0.3–1.2)
Total Protein: 5.9 g/dL — ABNORMAL LOW (ref 6.5–8.1)

## 2019-06-06 LAB — CBC WITH DIFFERENTIAL/PLATELET
Abs Immature Granulocytes: 0.16 10*3/uL — ABNORMAL HIGH (ref 0.00–0.07)
Basophils Absolute: 0 10*3/uL (ref 0.0–0.1)
Basophils Relative: 0 %
Eosinophils Absolute: 0.1 10*3/uL (ref 0.0–0.5)
Eosinophils Relative: 1 %
HCT: 30.9 % — ABNORMAL LOW (ref 36.0–46.0)
Hemoglobin: 10.6 g/dL — ABNORMAL LOW (ref 12.0–15.0)
Immature Granulocytes: 1 %
Lymphocytes Relative: 11 %
Lymphs Abs: 1.5 10*3/uL (ref 0.7–4.0)
MCH: 32 pg (ref 26.0–34.0)
MCHC: 34.3 g/dL (ref 30.0–36.0)
MCV: 93.4 fL (ref 80.0–100.0)
Monocytes Absolute: 1.3 10*3/uL — ABNORMAL HIGH (ref 0.1–1.0)
Monocytes Relative: 9 %
Neutro Abs: 11.4 10*3/uL — ABNORMAL HIGH (ref 1.7–7.7)
Neutrophils Relative %: 78 %
Platelets: 149 10*3/uL — ABNORMAL LOW (ref 150–400)
RBC: 3.31 MIL/uL — ABNORMAL LOW (ref 3.87–5.11)
RDW: 13.9 % (ref 11.5–15.5)
WBC: 14.5 10*3/uL — ABNORMAL HIGH (ref 4.0–10.5)
nRBC: 0 % (ref 0.0–0.2)

## 2019-06-06 MED ORDER — ENOXAPARIN SODIUM 60 MG/0.6ML ~~LOC~~ SOLN
0.5000 mg/kg | SUBCUTANEOUS | Status: DC
  Administered 2019-06-06: 09:00:00 55 mg via SUBCUTANEOUS
  Filled 2019-06-06: qty 0.6

## 2019-06-06 MED ORDER — LORATADINE 10 MG PO TABS
10.0000 mg | ORAL_TABLET | Freq: Every day | ORAL | Status: DC
  Filled 2019-06-06 (×2): qty 1

## 2019-06-06 MED ORDER — GUAIFENESIN-DM 100-10 MG/5ML PO SYRP
5.0000 mL | ORAL_SOLUTION | ORAL | Status: DC | PRN
  Filled 2019-06-06: qty 5

## 2019-06-06 MED ORDER — GENTAMICIN SULFATE 40 MG/ML IJ SOLN
5.0000 mg/kg | INTRAVENOUS | Status: DC
  Administered 2019-06-06: 300 mg via INTRAVENOUS
  Filled 2019-06-06: qty 7.5

## 2019-06-06 MED ORDER — AZITHROMYCIN 500 MG PO TABS
500.0000 mg | ORAL_TABLET | Freq: Every day | ORAL | Status: AC
  Administered 2019-06-06: 500 mg via ORAL
  Filled 2019-06-06: qty 1

## 2019-06-06 MED ORDER — CLINDAMYCIN PHOSPHATE 900 MG/50ML IV SOLN
900.0000 mg | Freq: Three times a day (TID) | INTRAVENOUS | Status: DC
  Administered 2019-06-06 (×2): 900 mg via INTRAVENOUS
  Filled 2019-06-06 (×4): qty 50

## 2019-06-06 MED ORDER — BISACODYL 10 MG RE SUPP
10.0000 mg | Freq: Every day | RECTAL | Status: DC | PRN
  Administered 2019-06-06: 10 mg via RECTAL
  Filled 2019-06-06: qty 1

## 2019-06-06 MED ORDER — FLUTICASONE PROPIONATE 50 MCG/ACT NA SUSP
2.0000 | Freq: Every day | NASAL | Status: DC
  Administered 2019-06-06: 14:00:00 2 via NASAL
  Filled 2019-06-06: qty 16

## 2019-06-06 MED ORDER — AZITHROMYCIN 250 MG PO TABS
250.0000 mg | ORAL_TABLET | Freq: Every day | ORAL | Status: DC
  Filled 2019-06-06: qty 1

## 2019-06-06 NOTE — Lactation Note (Signed)
This note was copied from a baby's chart. Lactation Consultation Note  Patient Name: Elizabeth [name] [id number]'A Date: 06/06/2019 Reason for consult: Follow-up assessment;Difficult latch;Hyperbilirubinemia   Mom called out for [facility].  Mom tried multiple attempts to latch then was successful in football hold.  LC worked with mom to sandwich breast tissue.  LC observed feed.  Mom used compressions and 1 swallow was heard.  Lips were flanged, continual sucking noted but no visible jaw drops indicating swallowing seen.    LC discussed importance of pumping, hand expressing to collect EBM for supplementation to bf due to jaundice, difficult latch, and questionable transfer; no voids since 10am.  Mom has pumped 2 times today and states she will start pumping after each feed.  She has not felt well today.  LC expresses understanding.  Offers mom option to use donor milk in the mean time while working on stimulating milk supply and increasing her milk volume.  She agrees to donor milk.  Consent signed.  Supplementation guideline provided.  LC suggests mom use bottle to supplement due to mom not having support person in the room with her to help tonight.  With difficulty latching, LC considered the added task of supplementing at the breast would be added stress for mom, already not feeling well.  Plan relayed to RN.   Maternal Data Has patient been taught Hand Expression?: Yes  Feeding Feeding Type: Breast Fed  LATCH Score Latch: Repeated attempts needed to sustain latch, nipple held in mouth throughout feeding, stimulation needed to elicit sucking reflex.  Audible Swallowing: A few with stimulation(one swallow heard during 5 mintues)  Type of Nipple: Flat  Comfort (Breast/Nipple): Soft / non-tender  Hold (Positioning): Assistance needed to correctly position infant at breast and maintain latch.  LATCH Score: 6  Interventions Interventions: Breast feeding basics reviewed;Assisted with  latch;Skin to skin;Breast massage;Hand express;Position options;DEBP;Support pillows  Lactation Tools Discussed/Used     Consult Status Consult Status: Follow-up Date: 06/07/19 Follow-up type: In-patient    Elizabeth [name] [facility] 06/06/2019, 7:22 PM

## 2019-06-06 NOTE — Progress Notes (Signed)
Subjective: [place] is a G2P2 POD #2 for NRFHT. Mild preeclampsia, no medications required.  Hgb 14.0-10.7. Mild tachycardia, improved with bolus. All along pt denies any associated symptoms Circumcision plans out-patient w/ [facility] Pt previously reported non productive cough that started in the OR, however new findings reported from [employer] as of assessment at 0000 that pt has now had a temp of 100.1, followed by repeat of 100.7 with new onset coarse lungs sounds.     Objective:  VS:  Vitals:   06/05/19 1834 06/05/19 2115 06/05/19 2210 06/06/19 0015  BP:  123/70    Pulse: (!) 127 (!) 125    Resp:  20    Temp:  100.1 F (37.8 C) 99.9 F (37.7 C) (!) 100.7 F (38.2 C)  TempSrc:  Oral Oral Oral  SpO2: 99%     Weight:      Height:        Intake/Output Summary (Last 24 hours) at 06/06/2019 0053 Last data filed at 06/05/2019 1300 Gross per 24 hour  Intake --  Output 2325 ml  Net -2325 ml     Recent Labs    06/04/19 0310 06/05/19 0523  WBC 8.1 18.2*  HGB 14.0 10.7*  HCT 39.6 31.0*  PLT 174 130*        Assessment/Plan: 29 y.o.   POD# 2 G2P2 S/P Primary Cesarean section for abnormal FHR surveillance  Pre-eclampsia w/o severe features    -normotensive    -cath urine PCR 0.52                Tachycardic    -suspect hypovolemia    -HR reduced from 140 to 112 after IV fluid bolus    -new onset coarse lung sounds. CXR to rule out pulmonary edema Acute blood loss anemia    -start Niferex 150 PO daily PP Endometritis, (Pt GBS Pos with anaphylaxis to cillins)    -Start Gent/ Clinda    -repeat CBC this am  Routine post-op PP care          Advised warm fluids and ambulation to improve GI motility Encourage rest when baby rests Breastfeeding support Encouraged incentive spirometer Nasal spray for chronic allergies, continue lozenges    Carolan Shiver, MSN, CNM 06/06/2019, 12:53 AM

## 2019-06-06 NOTE — Lactation Note (Signed)
This note was copied from a baby's chart. Lactation Consultation Note  Patient Name: Boy Zera Markwardt BZJIR'C Date: 06/06/2019     Mom tell LC upon entering that infant is bf very well and she is not worried about the bf at all.  She is currently concerned about getting medicine for her.  Moms RN at bedside.  Mom tell LC infant fed recently very well and that she hears swallows when he feeds.     Infant is on phototherapy.  LC discussed sleepiness associated with jaundice.  LC reviewed pacifier recommendations with mom.  She states she gave it so she could sleep.    LC offered to assist with bf but mom feels he has just fed so declined offer.  LC offered to help mom collect EBM to feed to infant for extra calories.  LC asks mom to put on her mask and mom began coughing after placing her cloth mask on and saying the she can't put it on and can't do this right now.   LC brings mom a disposable mask and offers it for possibly more comfort and cleanliness due to her cough.  Mom accepts masks.  LC encouraged her to call out for lactation to observe a feed when infant cues and wakes.  Mom agrees.     Maternal Data    Feeding Feeding Type: Breast Fed  LATCH Score Latch: Repeated attempts needed to sustain latch, nipple held in mouth throughout feeding, stimulation needed to elicit sucking reflex.  Audible Swallowing: A few with stimulation  Type of Nipple: Everted at rest and after stimulation  Comfort (Breast/Nipple): Soft / non-tender  Hold (Positioning): Assistance needed to correctly position infant at breast and maintain latch.  LATCH Score: 7  Interventions    Lactation Tools Discussed/Used     Consult Status      [name] [facility] 06/06/2019, 5:22 PM

## 2019-06-06 NOTE — Progress Notes (Addendum)
Subjective: Postop Day 2: Cesarean Delivery Lochia normal.  Breast feeding yes.  Pt reports pain is not well controlled due to the "phlemy cough" that she has. Holding a pillow over her abdomen does not help. Using incentive spirometer every 3 hours and is up 1700.  Pt has been a smoker since the age of 22 but quit when she found out she was pregnant.  Pt states she has "bad allergies" and took Flonase and Claritin prior to pregnancy.  She feels congested and is agreeable to resuming her medications.  Pt denies leg pain.  Objective: Temp:  [97.9 F (36.6 C)-100.7 F (38.2 C)] 97.9 F (36.6 C) (01/01 0810) Pulse Rate:  [125-128] 127 (01/01 0810) Resp:  [18-20] 20 (01/01 0810) BP: (123-131)/(65-97) 131/65 (01/01 0810) SpO2:  [98 %-100 %] 100 % (01/01 0810)  Physical Exam: Gen: NAD Lochia: Not visualized Abdomen:  Significantly distended.  Tympanic to percussion., soft. nontender. Uterine Fundus: firm, appropriately tender Incision: clean, dry and intact, healing well DVT Evaluation: 3+ Edema present, no calf tenderness bilaterally, Neg Homen's size.  Equal size. No discoloration.  CBC Latest Ref Rng & Units 06/06/2019 06/05/2019 06/04/2019  WBC 4.0 - 10.5 K/uL 14.5(H) 18.2(H) 8.1  Hemoglobin 12.0 - 15.0 g/dL 10.6(L) 10.7(L) 14.0  Hematocrit 36.0 - 46.0 % 30.9(L) 31.0(L) 39.6  Platelets 150 - 400 K/uL 149(L) 130(L) 174   CMP Latest Ref Rng & Units 06/06/2019 06/05/2019 04/12/2018  Glucose 70 - 99 mg/dL 80 79 92  BUN 6 - 20 mg/dL 5(L) 8 6  Creatinine 1.47 - 1.00 mg/dL 8.29 5.62(Z) 3.08  Sodium 135 - 145 mmol/L 135 137 138  Potassium 3.5 - 5.1 mmol/L 3.5 3.8 3.8  Chloride 98 - 111 mmol/L 109 111 109  CO2 22 - 32 mmol/L 19(L) 17(L) 24  Calcium 8.9 - 10.3 mg/dL 6.5(H) 8.4(O) 9.3  Total Protein 6.5 - 8.1 g/dL 5.9(L) 5.4(L) 7.1  Total Bilirubin 0.3 - 1.2 mg/dL 0.8 9.6(E) 0.7  Alkaline Phos 38 - 126 U/L 142(H) 138(H) 45  AST 15 - 41 U/L 43(H) 41 17  ALT 0 - 44 U/L 25 21 8      Assessment/Plan: Status post C-section-doing well postoperatively.  Pt clinically does not appear ill. Possible LLL pneumonia.  Afebrile x 12 hours. WBCs trending down.  D/w ID, Dr. .  Pt on history and Xray it does not sound like pt has Covid to him.  He does not recommend  retesting.  Recommends discontinuing Gentamycin and Clindamycin and start Zpak to treat pneumonia. \  Encouraged incentive spirometry. Cough  Start Robitussin DM.  Abdominal binder to help minimize pain with coughing. Pain management  Continue Ibuprofen 800 mg q 8 hours and Percocet 2 tabs for breakthrough pain.  Pt very distended and gaseous.  Encouraged ambulation.  Start Gas X and Dulcolax prn BM.  Preeclampsia without severe features  BP normal.  No pulmonary edema on Xray.  Continue to observe closely. Allergies  Resume Claritin and Flonase. Tachycardia- Improving. Hemoglobin stable.  Encouraged hydration. DVT prophylaxis  Lovenox started this morning. Encouraged ambulation in room TID. Lactation support.  Daiva Eves, CNM updated   Rhea Pink 06/06/2019, 12:16 PM

## 2019-06-06 NOTE — Lactation Note (Signed)
This note was copied from a baby's chart. Lactation Consultation Note  Patient Name: Elizabeth [name] TELMR'A Date: 06/06/2019 Reason for consult: Follow-up assessment   LC reviewed paced bottle feeding with mom.  Slow flow nipple used.  Infant took 4ml.  Some milk leaking from side of mouth toward end of feed and infant slow to take from the bottle.  Sucking rhythmic then infant paused and would then take a minute to start sucking again.     LC suggested to RN to if mom expresses difficulty with the next bottle feed to consider a slower flow nipple.     Maternal Data Has patient been taught Hand Expression?: Yes  Feeding Feeding Type: Donor Breast Milk Nipple Type: Slow - flow  LATCH Score Latch: Repeated attempts needed to sustain latch, nipple held in mouth throughout feeding, stimulation needed to elicit sucking reflex.  Audible Swallowing: A few with stimulation(one swallow heard during 5 mintues)  Type of Nipple: Flat  Comfort (Breast/Nipple): Soft / non-tender  Hold (Positioning): Assistance needed to correctly position infant at breast and maintain latch.  LATCH Score: 6  Interventions Interventions: Breast feeding basics reviewed;Assisted with latch;Skin to skin;Breast massage;Hand express;Position options;DEBP;Support pillows  Lactation Tools Discussed/Used Tools: Bottle   Consult Status Consult Status: Follow-up Date: 06/07/19 Follow-up type: In-patient    [name] [facility] 06/06/2019, 7:32 PM

## 2019-06-06 NOTE — Progress Notes (Signed)
Notified CNM of pts chest xray results that were suspicious for infectious pneumonitis. Order received for droplet precautions. CNM asks if another covid swab should be run. Per AC, test should only be done every 14 days as hers was negative 2 days ago. Attempted to call Infectious Disease, no answer. Voicemail left.

## 2019-06-07 DIAGNOSIS — J189 Pneumonia, unspecified organism: Secondary | ICD-10-CM | POA: Diagnosis not present

## 2019-06-07 MED ORDER — GUAIFENESIN-DM 100-10 MG/5ML PO SYRP: 5.0000 mL | ORAL_SOLUTION | ORAL | 0 refills | Status: AC | PRN

## 2019-06-07 MED ORDER — SENNOSIDES-DOCUSATE SODIUM 8.6-50 MG PO TABS: 2.0000 | ORAL_TABLET | ORAL | 0 refills | Status: DC

## 2019-06-07 MED ORDER — AZITHROMYCIN 500 MG PO TABS: 250.0000 mg | ORAL_TABLET | Freq: Every day | ORAL | 0 refills | Status: DC

## 2019-06-07 MED ORDER — OXYCODONE HCL 5 MG PO TABS: 5.0000 mg | ORAL_TABLET | Freq: Three times a day (TID) | ORAL | 0 refills | Status: AC | PRN

## 2019-06-07 NOTE — Discharge Instructions (Signed)
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
## 2019-06-07 NOTE — Lactation Note (Addendum)
This note was copied from a baby's chart. Lactation Consultation Note:  LC arrived in mothers room and mother sitting on the edge of the chair with infant in cradle hold with infant siding to the tip of the nipple. Mother reports that she has latched infant but she is feeling lots of pain.   Reviewed proper positioning with better support. Assist mother with cross cradle hold. Infant sustained latch for 15-20 mins.   Observed frequent suckling and swallows. Mother was advised to rotate positions frequently and to do breast compression. Discussed treatment and prevention of engorgement.   GMOB ask how to use the boppy pillow.   Mother was given the harmony hand pump with instructions to use as needed. Mother reports that she needs lots of moisturizer for her nipples. Advised to use ebm for increased comfort and healing of her nipples. Nipples intact without redness.   Mother advised to continue to cue base feed and to feed infant 8-12 times in 24 hours or more. Mother to continue to do frequent STS.  Reviewed all available LC services at [facility]. Mother encouraged to follow up for OP dept services. Encouraged to call for questions or concerns.   Patient Name: Boy Eloyse Causey [id number] Date: 06/07/2019 Reason for consult: Follow-up assessment   Maternal Data    Feeding Feeding Type: Breast Fed  LATCH Score Latch: Grasps breast easily, tongue down, lips flanged, rhythmical sucking.  Audible Swallowing: Spontaneous and intermittent  Type of Nipple: Everted at rest and after stimulation  Comfort (Breast/Nipple): Filling, red/small blisters or bruises, mild/mod discomfort  Hold (Positioning): Assistance needed to correctly position infant at breast and maintain latch.  LATCH Score: 8  Interventions Interventions: Assisted with latch;Skin to skin;Breast compression;Adjust position;Support pillows;Position options;Hand pump  Lactation Tools Discussed/Used     Consult Status       Michel Bickers 06/07/2019, 11:59 AM

## 2019-06-09 LAB — SURGICAL PATHOLOGY

## 2019-06-11 ENCOUNTER — Other Ambulatory Visit: Payer: Self-pay

## 2019-06-11 ENCOUNTER — Inpatient Hospital Stay (HOSPITAL_COMMUNITY)
Admission: AD | Admit: 2019-06-11 | Discharge: 2019-06-12 | Disposition: A | Discharge status: Home / Self Care | Payer: Medicaid Other | Attending: Obstetrics & Gynecology | Admitting: Obstetrics & Gynecology

## 2019-06-11 ENCOUNTER — Encounter (HOSPITAL_COMMUNITY): Payer: Self-pay | Admitting: Obstetrics & Gynecology

## 2019-06-11 DIAGNOSIS — Z888 Allergy status to other drugs, medicaments and biological substances status: Secondary | ICD-10-CM | POA: Insufficient documentation

## 2019-06-11 DIAGNOSIS — O8600 Infection of obstetric surgical wound, unspecified: Secondary | ICD-10-CM

## 2019-06-11 DIAGNOSIS — Z880 Allergy status to penicillin: Secondary | ICD-10-CM | POA: Insufficient documentation

## 2019-06-11 DIAGNOSIS — O99893 Other specified diseases and conditions complicating puerperium: Secondary | ICD-10-CM | POA: Insufficient documentation

## 2019-06-11 DIAGNOSIS — G8918 Other acute postprocedural pain: Secondary | ICD-10-CM

## 2019-06-11 DIAGNOSIS — Z87891 Personal history of nicotine dependence: Secondary | ICD-10-CM | POA: Insufficient documentation

## 2019-06-11 DIAGNOSIS — R109 Unspecified abdominal pain: Secondary | ICD-10-CM | POA: Insufficient documentation

## 2019-06-11 MED ORDER — KETOROLAC TROMETHAMINE 60 MG/2ML IM SOLN
60.0000 mg | Freq: Once | INTRAMUSCULAR | Status: AC
  Administered 2019-06-12: 60 mg via INTRAMUSCULAR
  Filled 2019-06-11: qty 2

## 2019-06-11 NOTE — MAU Provider Note (Signed)
Chief Complaint:  Incisional Pain   First Provider Initiated Contact with Patient 06/11/19 2319       HPI: Elizabeth [name] is a 29 y.o. G2P1011 who presents to maternity admissions reporting pain at her incision.  Feels like it is swollen.  Has been taking pain meds infrequently  . She reports no vaginal bleeding, vaginal itching/burning, urinary symptoms, h/a, dizziness, n/v, or fever/chills.   Had pneumonia when in hospital   Just finished Z-pack for this.  Feels breathing is very much improved  Abdominal Pain This is a recurrent problem. The current episode started in the past 7 days. The onset quality is gradual. The problem occurs constantly. The problem has been unchanged. The quality of the pain is sharp. The abdominal pain does not radiate. Pertinent negatives include no constipation, diarrhea, dysuria, fever, frequency, myalgias, nausea or vomiting. The pain is aggravated by palpation and certain positions. The pain is relieved by nothing. She has tried acetaminophen and oral narcotic analgesics for the symptoms. The treatment provided mild relief. Her past medical history is significant for abdominal surgery.   RN Note: C/s on 12/30.  States since then her incisional pain has gotten exceedingly worse.  The pain started getting worse after the dressing came off yesterday.  Is unaware whether there is any abnormal drainage coming from it.  States the pain is so bad she feels like she is going to pass out.  Took oxycodone last night without relief and motrin today.  States she had to change to formula feeding because she couldn't handle the pain.  Pain is constant.  VB is almost gone.  Past Medical History: Past Medical History:  Diagnosis Date  . Anxiety   . Bipolar 1 disorder (HCC)   . Chlamydia infection 2005, 2008  . Recurrent vaginitis     Past obstetric history: OB History  Gravida Para Term Preterm AB Living  2 1 1  0 1 1  SAB TAB Ectopic Multiple Live Births  0 1 0 0 1     # Outcome Date GA Lbr Len/2nd Weight Sex Delivery Anes PTL Lv  2 Term 06/04/19 [redacted]w[redacted]d  2970 g M CS-LTranv EPI  LIV  1 TAB 2011            Past Surgical History: Past Surgical History:  Procedure Laterality Date  . ABSCESS DRAINAGE     left groin  . CESAREAN SECTION N/A 06/04/2019   Procedure: CESAREAN SECTION;  Surgeon: 06/06/2019, MD;  Location: [facility];  Service: Obstetrics;  Laterality: N/A;  . HEEL SPUR SURGERY    . INCISION AND DRAINAGE ABSCESS Left 03/03/2016   Procedure: INCISION AND DRAINAGE ABSCESS;  Surgeon: 03/05/2016, MD;  Location: [facility] ORS;  Service: General;  Laterality: Left;  . right foot surgery    . TONSILLECTOMY AND ADENOIDECTOMY      Family History: Family History  Problem Relation Age of Onset  . Hypertension Mother   . Thyroid cancer Mother   . Hypertension Father   . Alcohol abuse Paternal Grandfather   . Hypertension Maternal Grandmother   . Cancer Maternal Grandmother   . Heart Problems Paternal Grandmother     Social History: Social History   Tobacco Use  . Smoking status: Former Smoker    Packs/day: 0.50    Types: Cigarettes  . Smokeless tobacco: Never Used  Substance Use Topics  . Alcohol use: Not Currently    Alcohol/week: 1.0 standard drinks    Types: 1 Glasses  of wine per week  . Drug use: Not Currently    Types: Marijuana    Comment: Last use 3 weeks ago    Allergies:  Allergies  Allergen Reactions  . Depakote [Divalproex Sodium] Anaphylaxis  . Penicillins Anaphylaxis and Other (See Comments)    Has patient had a PCN reaction causing immediate rash, facial/tongue/throat swelling, SOB or lightheadedness with hypotension: Yes Has patient had a PCN reaction causing severe rash involving mucus membranes or skin necrosis: No Has patient had a PCN reaction that required hospitalization No Has patient had a PCN reaction occurring within the last 10 years: No If all of the above answers are "NO", then may proceed with  Cephalosporin use.  [name] Risperidone And Related Other (See Comments)    Weight gain  . Trazodone And Nefazodone Other (See Comments)    Pt states that she does not like how this medication makes her feel.   . Vistaril [Hydroxyzine Hcl] Itching  . Peanut Butter Flavor Itching and Rash    Reaction to peanut butter only, tolerates peanuts.    Meds:  Medications Prior to Admission  Medication Sig Dispense Refill Last Dose  . ibuprofen (ADVIL) 600 MG tablet Take 600 mg by mouth every 6 (six) hours as needed.   06/11/2019 at Unknown time  . oxyCODONE-acetaminophen (PERCOCET/ROXICET) 5-325 MG tablet Take by mouth every 4 (four) hours as needed for severe pain.   06/10/2019 at Unknown time  . acetaminophen (TYLENOL) 500 MG tablet Take 500 mg by mouth every 6 (six) hours as needed.     [name] azithromycin (ZITHROMAX) 500 MG tablet Take 0.5 tablets (250 mg total) by mouth daily for 4 days. Take 1 tablet daily for 3 days. 2 tablet 0   . guaiFENesin-dextromethorphan (ROBITUSSIN DM) 100-10 MG/5ML syrup Take 5 mLs by mouth every 4 (four) hours as needed for up to 7 days for cough. 118 mL 0   . oxyCODONE (ROXICODONE) 5 MG immediate release tablet Take 1 tablet (5 mg total) by mouth every 8 (eight) hours as needed. 15 tablet 0   . Prenatal Vit-Fe Fumarate-FA (PRENATAL VITAMINS PO) Take by mouth.     . senna-docusate (SENOKOT-S) 8.6-50 MG tablet Take 2 tablets by mouth daily. 30 tablet 0     I have reviewed patient's Past Medical Hx, Surgical Hx, Family Hx, Social Hx, medications and allergies.  ROS:  Review of Systems  Constitutional: Negative for fever.  Gastrointestinal: Positive for abdominal pain. Negative for constipation, diarrhea, nausea and vomiting.  Genitourinary: Negative for dysuria and frequency.  Musculoskeletal: Negative for myalgias.   Other systems negative     Physical Exam   Patient Vitals for the past 24 hrs:  BP Temp Pulse Resp  06/11/19 2245 134/83 98.8 F (37.1 C) 87 (!) 21    Constitutional: Well-developed, well-nourished female in no acute distress.  Cardiovascular: normal rate and rhythm Respiratory: normal effort, no distress.  GI: Abd soft, tender over incision.  Nondistended.  No rebound, No guarding.         Large dressing removed.  Steri-Strips removed.  Incision is healing and well approximated.  No dehiscence or drainage.   MS: Extremities nontender, no edema, normal ROM Neurologic: Alert and oriented x 4.   Grossly nonfocal. GU: Neg CVAT. Skin:  Warm and Dry Psych:  Affect appropriate.  PELVIC EXAM: scant blood.    Labs: Results for orders placed or performed during the hospital encounter of 06/11/19 (from the past 24 hour(s))  CBC with  Differential     Status: Abnormal   Collection Time: 06/12/19 12:17 AM  Result Value Ref Range   WBC 11.5 (H) 4.0 - 10.5 K/uL   RBC 3.60 (L) 3.87 - 5.11 MIL/uL   Hemoglobin 11.6 (L) 12.0 - 15.0 g/dL   HCT 86.3 (L) 81.7 - 71.1 %   MCV 92.2 80.0 - 100.0 fL   MCH 32.2 26.0 - 34.0 pg   MCHC 34.9 30.0 - 36.0 g/dL   RDW 65.7 90.3 - 83.3 %   Platelets 411 (H) 150 - 400 K/uL   nRBC 0.2 0.0 - 0.2 %   Neutrophils Relative % 66 %   Neutro Abs 7.6 1.7 - 7.7 K/uL   Lymphocytes Relative 21 %   Lymphs Abs 2.4 0.7 - 4.0 K/uL   Monocytes Relative 8 %   Monocytes Absolute 0.9 0.1 - 1.0 K/uL   Eosinophils Relative 1 %   Eosinophils Absolute 0.1 0.0 - 0.5 K/uL   Basophils Relative 0 %   Basophils Absolute 0.0 0.0 - 0.1 K/uL   Immature Granulocytes 4 %   Abs Immature Granulocytes 0.44 (H) 0.00 - 0.07 K/uL    --/--/O POS, O POS Performed at [facility], [address], [place], [place] [address]  [phone])  Imaging:    MAU Course/MDM: I have ordered labs as follows:  CBC to see if there is leukocytosis.  WBC level is lower than last value Imaging ordered: none, since respiratory status has improved Results reviewed.    Treatments in MAU included Toradol which only slightly helped pain.  Dilaudid  2mg  po given with relief. .   Pt stable at time of discharge.  Assessment: Postop Day #7 Postoperative incisional pain  Plan: Discharge home Recommend keep wound clean and dry with no dressing Take Ibuprofen on schedule and Percocet PRN  Followup in office Encouraged to return here or to other Urgent Care/ED if she develops worsening of symptoms, increase in pain, fever, or other concerning symptoms.   CNM, MSN Certified Nurse-Midwife 06/11/2019 11:19 PM

## 2019-06-11 NOTE — Addendum Note (Signed)
Addendum  created 06/11/19 1925 by Leilani Able, MD   Intraprocedure Staff edited

## 2019-06-11 NOTE — MAU Note (Signed)
C/s on 12/30.  States since then her incisional pain has gotten exceedingly worse.  The pain started getting worse after the dressing came off yesterday.  Is unaware whether there is any abnormal drainage coming from it.  States the pain is so bad she feels like she is going to pass out.  Took oxycodone last night without relief and motrin today.  States she had to change to formula feeding because she couldn't handle the pain.  Pain is constant.  VB is almost gone.

## 2019-06-12 DIAGNOSIS — G8918 Other acute postprocedural pain: Secondary | ICD-10-CM | POA: Diagnosis not present

## 2019-06-12 DIAGNOSIS — O8600 Infection of obstetric surgical wound, unspecified: Secondary | ICD-10-CM | POA: Diagnosis not present

## 2019-06-12 DIAGNOSIS — Z888 Allergy status to other drugs, medicaments and biological substances status: Secondary | ICD-10-CM | POA: Diagnosis not present

## 2019-06-12 DIAGNOSIS — O99893 Other specified diseases and conditions complicating puerperium: Secondary | ICD-10-CM | POA: Diagnosis not present

## 2019-06-12 DIAGNOSIS — Z880 Allergy status to penicillin: Secondary | ICD-10-CM | POA: Diagnosis not present

## 2019-06-12 DIAGNOSIS — Z87891 Personal history of nicotine dependence: Secondary | ICD-10-CM | POA: Diagnosis not present

## 2019-06-12 DIAGNOSIS — R109 Unspecified abdominal pain: Secondary | ICD-10-CM | POA: Diagnosis not present

## 2019-06-12 LAB — CBC WITH DIFFERENTIAL/PLATELET
Abs Immature Granulocytes: 0.44 10*3/uL — ABNORMAL HIGH (ref 0.00–0.07)
Basophils Absolute: 0 10*3/uL (ref 0.0–0.1)
Basophils Relative: 0 %
Eosinophils Absolute: 0.1 10*3/uL (ref 0.0–0.5)
Eosinophils Relative: 1 %
HCT: 33.2 % — ABNORMAL LOW (ref 36.0–46.0)
Hemoglobin: 11.6 g/dL — ABNORMAL LOW (ref 12.0–15.0)
Immature Granulocytes: 4 %
Lymphocytes Relative: 21 %
Lymphs Abs: 2.4 10*3/uL (ref 0.7–4.0)
MCH: 32.2 pg (ref 26.0–34.0)
MCHC: 34.9 g/dL (ref 30.0–36.0)
MCV: 92.2 fL (ref 80.0–100.0)
Monocytes Absolute: 0.9 10*3/uL (ref 0.1–1.0)
Monocytes Relative: 8 %
Neutro Abs: 7.6 10*3/uL (ref 1.7–7.7)
Neutrophils Relative %: 66 %
Platelets: 411 10*3/uL — ABNORMAL HIGH (ref 150–400)
RBC: 3.6 MIL/uL — ABNORMAL LOW (ref 3.87–5.11)
RDW: 13.2 % (ref 11.5–15.5)
WBC: 11.5 10*3/uL — ABNORMAL HIGH (ref 4.0–10.5)
nRBC: 0.2 % (ref 0.0–0.2)

## 2019-06-12 MED ORDER — HYDROMORPHONE HCL 2 MG PO TABS
2.0000 mg | ORAL_TABLET | Freq: Once | ORAL | Status: AC
  Administered 2019-06-12: 2 mg via ORAL
  Filled 2019-06-12: qty 1

## 2019-06-12 NOTE — Discharge Instructions (Signed)
Pain Relief Before and After Surgery  Pain relief is an important part of your overall care before, during, and after surgery. You and your health care provider will work together to make a plan to manage pain that you have before surgery (preoperative) and after surgery (postoperative). Addressing pain before surgery lessens the pain that you will have after surgery. Make sure that you fully understand and agree with your pain relief plan. If you have questions or concerns, it is important to discuss them with your health care provider. If you have pain that is not controlled by medicine, tell your health care provider. Severe pain after surgery may:  Prevent sleep.  Decrease your ability to breathe deeply and to cough. This can result in pneumonia or upper airway infections.  Cause your heart to beat more quickly.  Cause your blood pressure to be higher.  Increase your risk for stomach and digestive problems.  Slow down wound healing.  Lead to depression, anxiety, and feelings of helplessness. Your health care provider may use more than one method at a time to help relieve your pain. Using this approach may allow you to eat, move around, and possibly leave the hospital sooner. What are options for managing pain before and after surgery? Oral pain medicines Pain medicines taken by mouth (orally) include:  Non-narcotic medicines: ? Acetaminophen. ? NSAIDs, such as ibuprofen and naproxen.  Muscle relaxants. These may relieve pain caused by muscle spasms.  Anticonvulsants. These medicines are usually used to treat seizures. They may help to lessen nerve pain.  Opioids. These medicines relieve pain by binding to pain receptors in the brain and spinal cord (narcotic pain medicines). Opioids may help relieve short-term (acute) postoperative pain that is moderate to moderately severe. ? Opioids are often combined with non-narcotic medicines to improve pain relief, lower the risk of side  effects, and lower the chance of addiction. ? To help prevent addiction, opioids are given for short periods of time in careful doses. If you follow instructions from your health care provider and you do not have a history of substance abuse, your risk of becoming addicted to opioids is low. Some of these medicines may be available in injectable form. They may be given through an IV if you are unable to eat or drink. As-needed pain control You can receive pain medicine when you need it, through an IV or as a pill or liquid. When you tell your health care provider that you are having pain, he or she will give you the proper pain medicine. Medicine that numbs an area You may be given pain medicine that numbs an area (local anesthetic):  As an injection near your painful area (local infiltration).  As an injection near the nerve that provides feeling to a specific part of your body (peripheral nerve block).  As an injection in your spine (spinal block).  Through a local anesthetic reservoir pump. For this method, one or more catheters are inserted into your incision at the end of your procedure. These catheters are connected to a device that is filled with a non-narcotic pain medicine. Medicine gradually empties into your incision area over the next several days. Continuous epidural pain control With this method, you receive pain medicine through a small, thin tube (catheter) that is inserted into your back, near your spinal cord. Medicine flows through the catheter to lessen pain in areas of your body that are below the catheter. The catheter is usually put into the back shortly before   surgery. It may be left in until you can eat, take medicine by mouth, pass urine, and have a bowel movement. This method may be recommended if you are having surgery on your abdomen, hip area, or legs. This method of pain relief may help you heal faster because you may be able to do these things sooner:  Regain normal  bowel and bladder function.  Return to eating.  Get up and walk. IV patient-controlled analgesia (PCA) pump With this method, you receive pain medicine through an IV that is connected to a PCA pump. The PCA pump gives you a specific amount of medicine when you push a button. This lets you control how much medicine you receive. You are the only person who should push this button. The pump is set up so that you cannot accidentally give yourself too much medicine. You will be able to start using your PCA pump in the recovery room after your procedure. Tell your health care provider:  If you are having too much pain.  If you cannot push the button.  If you are feeling too sleepy or nauseous. Other pain control methods Other methods of pain relief after surgery include:  Heat and cold therapy.  Massage.  Topical analgesics. These are patches, creams, and gels that can be applied on the skin.  Steroid medicines. These medicines may be given to lessen swelling.  Physical therapy. A physical therapist will work with you to meet goals, such as feeling and functioning better. Physical therapy usually includes specific exercises that are tailored to your needs.  Transcutaneous electrical nerve stimulation (TENS). This method sends electrical signals through the skin to interrupt pain signals.  Cognitive behavioral therapy (CBT). This therapy helps you learn coping skills for dealing with pain. What are some questions to ask my health care provider?  What pain relief options would be best for me?  What are the risks of each option?  What are the benefits of each option?  How long will I need pain relief after surgery? Summary  A plan to manage pain that you may have before surgery (preoperative) and after surgery (postoperative) is an important part of your overall care.  Pain management options include medicines and non-medical therapies, such as physical therapy, massage, and heat or  cold therapy.  Pain management medicines include opioids and non-narcotic medicines such as NSAIDs, steroids, or local anesthetics.  Pain medicines can have side effects. Side effects of opioids include constipation, nausea, excessive sleepiness, and risk of addiction. Your health care provider will work with you to prevent or manage these side effects and risks. This information is not intended to replace advice given to you by your health care provider. Make sure you discuss any questions you have with your health care provider. Document Revised: 02/12/2019 Document Reviewed: 09/01/2016 Elsevier Patient Education  [place].  Cesarean Delivery, Care After This sheet gives you information about how to care for yourself after your procedure. Your health care provider may also give you more specific instructions. If you have problems or questions, contact your health care provider. What can I expect after the procedure? After the procedure, it is common to have:  A small amount of blood or clear fluid coming from the incision.  Some redness, swelling, and pain in your incision area.  Some abdominal pain and soreness.  Vaginal bleeding (lochia). Even though you did not have a vaginal delivery, you will still have vaginal bleeding and discharge.  Pelvic cramps.  Fatigue. You may have pain, swelling, and discomfort in the tissue between your vagina and your anus (perineum) if:  Your C-section was unplanned, and you were allowed to labor and push.  An incision was made in the area (episiotomy) or the tissue tore during attempted vaginal delivery. Follow these instructions at home: Incision care   Follow instructions from your health care provider about how to take care of your incision. Make sure you: ? Wash your hands with soap and water before you change your bandage (dressing). If soap and water are not available, use hand sanitizer. ? If you have a dressing, change it or  remove it as told by your health care provider. ? Leave stitches (sutures), skin staples, skin glue, or adhesive strips in place. These skin closures may need to stay in place for 2 weeks or longer. If adhesive strip edges start to loosen and curl up, you may trim the loose edges. Do not remove adhesive strips completely unless your health care provider tells you to do that.  Check your incision area every day for signs of infection. Check for: ? More redness, swelling, or pain. ? More fluid or blood. ? Warmth. ? Pus or a bad smell.  Do not take baths, swim, or use a hot tub until your health care provider says it's okay. Ask your health care provider if you can take showers.  When you cough or sneeze, hug a pillow. This helps with pain and decreases the chance of your incision opening up (dehiscing). Do this until your incision heals. Medicines  Take over-the-counter and prescription medicines only as told by your health care provider.  If you were prescribed an antibiotic medicine, take it as told by your health care provider. Do not stop taking the antibiotic even if you start to feel better.  Do not drive or use heavy machinery while taking prescription pain medicine. Lifestyle  Do not drink alcohol. This is especially important if you are breastfeeding or taking pain medicine.  Do not use any products that contain nicotine or tobacco, such as cigarettes, e-cigarettes, and chewing tobacco. If you need help quitting, ask your health care provider. Eating and drinking  Drink at least 8 eight-ounce glasses of water every day unless told not to by your health care provider. If you breastfeed, you may need to drink even more water.  Eat high-fiber foods every day. These foods may help prevent or relieve constipation. High-fiber foods include: ? Whole grain cereals and breads. ? Brown rice. ? Beans. ? Fresh fruits and vegetables. Activity   If possible, have someone help you care  for your baby and help with household activities for at least a few days after you leave the hospital.  Return to your normal activities as told by your health care provider. Ask your health care provider what activities are safe for you.  Rest as much as possible. Try to rest or take a nap while your baby is sleeping.  Do not lift anything that is heavier than 10 lbs (4.5 kg), or the limit that you were told, until your health care provider says that it is safe.  Talk with your health care provider about when you can engage in sexual activity. This may depend on your: ? Risk of infection. ? How fast you heal. ? Comfort and desire to engage in sexual activity. General instructions  Do not use tampons or douches until your health care provider approves.  Wear loose, comfortable clothing and  a supportive and well-fitting bra.  Keep your perineum clean and dry. Wipe from front to back when you use the toilet.  If you pass a blood clot, save it and call your health care provider to discuss. Do not flush blood clots down the toilet before you get instructions from your health care provider.  Keep all follow-up visits for you and your baby as told by your health care provider. This is important. Contact a health care provider if:  You have: ? A fever. ? Bad-smelling vaginal discharge. ? Pus or a bad smell coming from your incision. ? Difficulty or pain when urinating. ? A sudden increase or decrease in the frequency of your bowel movements. ? More redness, swelling, or pain around your incision. ? More fluid or blood coming from your incision. ? A rash. ? Nausea. ? Little or no interest in activities you used to enjoy. ? Questions about caring for yourself or your baby.  Your incision feels warm to the touch.  Your breasts turn red or become painful or hard.  You feel unusually sad or worried.  You vomit.  You pass a blood clot from your vagina.  You urinate more than  usual.  You are dizzy or light-headed. Get help right away if:  You have: ? Pain that does not go away or get better with medicine. ? Chest pain. ? Difficulty breathing. ? Blurred vision or spots in your vision. ? Thoughts about hurting yourself or your baby. ? New pain in your abdomen or in one of your legs. ? A severe headache.  You faint.  You bleed from your vagina so much that you fill more than one sanitary pad in one hour. Bleeding should not be heavier than your heaviest period. Summary  After the procedure, it is common to have pain at your incision site, abdominal cramping, and slight bleeding from your vagina.  Check your incision area every day for signs of infection.  Tell your health care provider about any unusual symptoms.  Keep all follow-up visits for you and your baby as told by your health care provider. This information is not intended to replace advice given to you by your health care provider. Make sure you discuss any questions you have with your health care provider. Document Revised: 11/28/2017 Document Reviewed: 11/28/2017 Elsevier Patient Education  2020 [employer].

## 2019-07-03 DIAGNOSIS — Z3009 Encounter for other general counseling and advice on contraception: Secondary | ICD-10-CM | POA: Diagnosis not present

## 2019-07-03 DIAGNOSIS — Z3040 Encounter for surveillance of contraceptives, unspecified: Secondary | ICD-10-CM | POA: Diagnosis not present

## 2019-07-17 DIAGNOSIS — Z309 Encounter for contraceptive management, unspecified: Secondary | ICD-10-CM | POA: Diagnosis not present

## 2019-07-17 DIAGNOSIS — Z3046 Encounter for surveillance of implantable subdermal contraceptive: Secondary | ICD-10-CM | POA: Diagnosis not present

## 2019-08-29 ENCOUNTER — Encounter (HOSPITAL_COMMUNITY): Payer: Self-pay

## 2019-08-29 ENCOUNTER — Other Ambulatory Visit: Payer: Self-pay

## 2019-08-29 ENCOUNTER — Emergency Department (HOSPITAL_COMMUNITY)
Admission: EM | Admit: 2019-08-29 | Discharge: 2019-08-29 | Disposition: A | Discharge status: Home / Self Care | Payer: Medicaid Other | Attending: Emergency Medicine | Admitting: Emergency Medicine

## 2019-08-29 DIAGNOSIS — L0231 Cutaneous abscess of buttock: Secondary | ICD-10-CM

## 2019-08-29 MED ORDER — CLINDAMYCIN HCL 150 MG PO CAPS: 450.0000 mg | ORAL_CAPSULE | Freq: Three times a day (TID) | ORAL | 0 refills | Status: AC

## 2019-08-29 NOTE — Discharge Instructions (Addendum)
I have prescribed antibiotics to help prevent infection. Please take 3 tablets three times a day for the next 7 days.  You may alternate ibuprofen or Tylenol to help with your pain.  If you experience any fever, chills or worsening symptoms please return to the ED.

## 2019-08-29 NOTE — ED Triage Notes (Signed)
Patient arrived POV from home c/o an abscess on R buttock that ruptured last night after taking a hot bath. 6/10 pain. Patient has hx of same. Patient looking for antibiotics to prevent infection. Patient stated that abscess was about 2 in in diameter.   AOx4
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
## 2019-09-04 ENCOUNTER — Ambulatory Visit: Payer: Medicaid Other

## 2019-09-05 ENCOUNTER — Ambulatory Visit: Payer: Medicaid Other

## 2019-09-06 ENCOUNTER — Ambulatory Visit: Payer: Medicaid Other | Attending: Internal Medicine

## 2019-09-06 DIAGNOSIS — Z23 Encounter for immunization: Secondary | ICD-10-CM

## 2019-09-06 NOTE — Progress Notes (Signed)
   Covid-19 Vaccination Clinic  Name:  Elizabeth [name]    MRN: [id number] DOB: [birth year]  09/06/2019  [name] was observed post Covid-19 immunization for 30 minutes based on pre-vaccination screening without incident. She was provided with Vaccine Information Sheet and instruction to access the V-Safe system.   [name] was instructed to call 911 with any severe reactions post vaccine: [name] Difficulty breathing  . Swelling of face and throat  . A fast heartbeat  . A bad rash all over body  . Dizziness and weakness   Immunizations Administered    Name Date Dose VIS Date Route   Pfizer COVID-19 Vaccine 09/06/2019 10:11 AM 0.3 mL 05/16/2019 Intramuscular   Manufacturer: [employer], [employer]   Lot: [id number]   NDC: [address]-2

## 2019-09-14 ENCOUNTER — Encounter (HOSPITAL_COMMUNITY): Payer: Self-pay | Admitting: *Deleted

## 2019-09-14 ENCOUNTER — Other Ambulatory Visit: Payer: Self-pay

## 2019-09-14 ENCOUNTER — Emergency Department (HOSPITAL_COMMUNITY)
Admission: EM | Admit: 2019-09-14 | Discharge: 2019-09-14 | Disposition: A | Discharge status: Home / Self Care | Payer: Medicaid Other | Attending: Emergency Medicine | Admitting: Emergency Medicine

## 2019-09-14 DIAGNOSIS — Z79899 Other long term (current) drug therapy: Secondary | ICD-10-CM | POA: Insufficient documentation

## 2019-09-14 DIAGNOSIS — Z91010 Allergy to peanuts: Secondary | ICD-10-CM | POA: Diagnosis not present

## 2019-09-14 DIAGNOSIS — Z87891 Personal history of nicotine dependence: Secondary | ICD-10-CM | POA: Diagnosis not present

## 2019-09-14 DIAGNOSIS — R05 Cough: Secondary | ICD-10-CM | POA: Insufficient documentation

## 2019-09-14 DIAGNOSIS — R059 Cough, unspecified: Secondary | ICD-10-CM

## 2019-09-14 MED ORDER — HYDROCODONE-HOMATROPINE 5-1.5 MG/5ML PO SYRP: 5.0000 mL | ORAL_SOLUTION | Freq: Every evening | ORAL | 0 refills | Status: DC | PRN

## 2019-09-14 MED ORDER — BENZONATATE 100 MG PO CAPS: 100.0000 mg | ORAL_CAPSULE | Freq: Three times a day (TID) | ORAL | 0 refills | Status: DC | PRN

## 2019-09-14 NOTE — ED Triage Notes (Signed)
Pt presents with c/o cough and runny nose.
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
## 2019-09-30 ENCOUNTER — Ambulatory Visit: Payer: Medicaid Other | Attending: Internal Medicine

## 2019-09-30 DIAGNOSIS — Z23 Encounter for immunization: Secondary | ICD-10-CM

## 2019-09-30 NOTE — Progress Notes (Signed)
   Covid-19 Vaccination Clinic  Name:  Elizabeth [name]    MRN: [id number] DOB: [birth year]  09/30/2019  [name] was observed post Covid-19 immunization for 15 minutes without incident. She was provided with Vaccine Information Sheet and instruction to access the V-Safe system.   [name] was instructed to call 911 with any severe reactions post vaccine: [name] Difficulty breathing  . Swelling of face and throat  . A fast heartbeat  . A bad rash all over body  . Dizziness and weakness   Immunizations Administered    Name Date Dose VIS Date Route   Pfizer COVID-19 Vaccine 09/30/2019  9:46 AM 0.3 mL 07/30/2018 Intramuscular   Manufacturer: [employer], [employer]   Lot: [id number]   NDC: [address]-3
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
## 2020-05-27 DIAGNOSIS — Z23 Encounter for immunization: Secondary | ICD-10-CM | POA: Diagnosis not present

## 2021-01-05 DIAGNOSIS — Z124 Encounter for screening for malignant neoplasm of cervix: Secondary | ICD-10-CM | POA: Diagnosis not present

## 2021-01-05 DIAGNOSIS — Z0000 Encounter for general adult medical examination without abnormal findings: Secondary | ICD-10-CM | POA: Diagnosis not present

## 2021-01-05 DIAGNOSIS — L732 Hidradenitis suppurativa: Secondary | ICD-10-CM | POA: Diagnosis not present

## 2021-01-05 DIAGNOSIS — N760 Acute vaginitis: Secondary | ICD-10-CM | POA: Diagnosis not present

## 2021-01-05 DIAGNOSIS — Z113 Encounter for screening for infections with a predominantly sexual mode of transmission: Secondary | ICD-10-CM | POA: Diagnosis not present

## 2021-01-05 DIAGNOSIS — N898 Other specified noninflammatory disorders of vagina: Secondary | ICD-10-CM | POA: Diagnosis not present

## 2021-01-05 DIAGNOSIS — Z01411 Encounter for gynecological examination (general) (routine) with abnormal findings: Secondary | ICD-10-CM | POA: Diagnosis not present

## 2021-01-05 DIAGNOSIS — Z6833 Body mass index (BMI) 33.0-33.9, adult: Secondary | ICD-10-CM | POA: Diagnosis not present

## 2021-01-05 DIAGNOSIS — Z01419 Encounter for gynecological examination (general) (routine) without abnormal findings: Secondary | ICD-10-CM | POA: Diagnosis not present

## 2021-03-16 ENCOUNTER — Telehealth: Payer: Self-pay

## 2021-03-16 NOTE — Telephone Encounter (Signed)
patient/family notified Dr.Wolfe left, and did not wish to continue care here. 

## 2021-03-20 DIAGNOSIS — H60392 Other infective otitis externa, left ear: Secondary | ICD-10-CM | POA: Diagnosis not present

## 2021-03-20 DIAGNOSIS — H6121 Impacted cerumen, right ear: Secondary | ICD-10-CM | POA: Diagnosis not present

## 2021-05-04 DIAGNOSIS — L689 Hypertrichosis, unspecified: Secondary | ICD-10-CM | POA: Diagnosis not present

## 2021-05-04 DIAGNOSIS — M5431 Sciatica, right side: Secondary | ICD-10-CM | POA: Diagnosis not present

## 2021-05-04 DIAGNOSIS — M545 Low back pain, unspecified: Secondary | ICD-10-CM | POA: Diagnosis not present

## 2021-05-04 DIAGNOSIS — A499 Bacterial infection, unspecified: Secondary | ICD-10-CM | POA: Diagnosis not present

## 2021-05-04 DIAGNOSIS — Z7251 High risk heterosexual behavior: Secondary | ICD-10-CM | POA: Diagnosis not present

## 2021-05-04 DIAGNOSIS — N760 Acute vaginitis: Secondary | ICD-10-CM | POA: Diagnosis not present

## 2021-07-06 DIAGNOSIS — N926 Irregular menstruation, unspecified: Secondary | ICD-10-CM | POA: Diagnosis not present

## 2021-07-06 DIAGNOSIS — L918 Other hypertrophic disorders of the skin: Secondary | ICD-10-CM | POA: Diagnosis not present

## 2021-07-06 DIAGNOSIS — L732 Hidradenitis suppurativa: Secondary | ICD-10-CM | POA: Diagnosis not present

## 2021-08-21 DIAGNOSIS — J029 Acute pharyngitis, unspecified: Secondary | ICD-10-CM | POA: Diagnosis not present

## 2021-08-21 DIAGNOSIS — H6123 Impacted cerumen, bilateral: Secondary | ICD-10-CM | POA: Diagnosis not present

## 2021-08-21 DIAGNOSIS — H9201 Otalgia, right ear: Secondary | ICD-10-CM | POA: Diagnosis not present

## 2022-01-25 DIAGNOSIS — H6123 Impacted cerumen, bilateral: Secondary | ICD-10-CM | POA: Diagnosis not present

## 2022-03-01 DIAGNOSIS — Z113 Encounter for screening for infections with a predominantly sexual mode of transmission: Secondary | ICD-10-CM | POA: Diagnosis not present

## 2022-03-01 DIAGNOSIS — N898 Other specified noninflammatory disorders of vagina: Secondary | ICD-10-CM | POA: Diagnosis not present

## 2022-03-01 DIAGNOSIS — Z0000 Encounter for general adult medical examination without abnormal findings: Secondary | ICD-10-CM | POA: Diagnosis not present

## 2022-03-01 DIAGNOSIS — Z0001 Encounter for general adult medical examination with abnormal findings: Secondary | ICD-10-CM | POA: Diagnosis not present

## 2022-03-01 DIAGNOSIS — N926 Irregular menstruation, unspecified: Secondary | ICD-10-CM | POA: Diagnosis not present

## 2022-03-01 DIAGNOSIS — Z6836 Body mass index (BMI) 36.0-36.9, adult: Secondary | ICD-10-CM | POA: Diagnosis not present

## 2022-03-01 DIAGNOSIS — L732 Hidradenitis suppurativa: Secondary | ICD-10-CM | POA: Diagnosis not present

## 2022-03-01 DIAGNOSIS — R6882 Decreased libido: Secondary | ICD-10-CM | POA: Diagnosis not present

## 2022-06-26 ENCOUNTER — Emergency Department (HOSPITAL_COMMUNITY)
Admission: EM | Admit: 2022-06-26 | Discharge: 2022-06-26 | Disposition: A | Discharge status: Home / Self Care | Payer: Medicaid Other | Attending: Emergency Medicine | Admitting: Emergency Medicine

## 2022-06-26 DIAGNOSIS — R197 Diarrhea, unspecified: Secondary | ICD-10-CM | POA: Diagnosis not present

## 2022-06-26 DIAGNOSIS — Z91010 Allergy to peanuts: Secondary | ICD-10-CM | POA: Insufficient documentation

## 2022-06-26 DIAGNOSIS — E876 Hypokalemia: Secondary | ICD-10-CM | POA: Diagnosis not present

## 2022-06-26 LAB — CBC
HCT: 43 % (ref 36.0–46.0)
Hemoglobin: 14.3 g/dL (ref 12.0–15.0)
MCH: 30.2 pg (ref 26.0–34.0)
MCHC: 33.3 g/dL (ref 30.0–36.0)
MCV: 90.7 fL (ref 80.0–100.0)
Platelets: 235 10*3/uL (ref 150–400)
RBC: 4.74 MIL/uL (ref 3.87–5.11)
RDW: 12.1 % (ref 11.5–15.5)
WBC: 4.5 10*3/uL (ref 4.0–10.5)
nRBC: 0 % (ref 0.0–0.2)

## 2022-06-26 LAB — COMPREHENSIVE METABOLIC PANEL
ALT: 14 U/L (ref 0–44)
AST: 21 U/L (ref 15–41)
Albumin: 3.9 g/dL (ref 3.5–5.0)
Alkaline Phosphatase: 53 U/L (ref 38–126)
Anion gap: 9 (ref 5–15)
BUN: 8 mg/dL (ref 6–20)
CO2: 19 mmol/L — ABNORMAL LOW (ref 22–32)
Calcium: 9.2 mg/dL (ref 8.9–10.3)
Chloride: 108 mmol/L (ref 98–111)
Creatinine, Ser: 0.71 mg/dL (ref 0.44–1.00)
GFR, Estimated: >60 mL/min (ref >60)
Glucose, Bld: 85 mg/dL (ref 70–99)
Potassium: 2.9 mmol/L — ABNORMAL LOW (ref 3.5–5.1)
Sodium: 136 mmol/L (ref 135–145)
Total Bilirubin: 0.5 mg/dL (ref 0.3–1.2)
Total Protein: 7.5 g/dL (ref 6.5–8.1)

## 2022-06-26 LAB — URINALYSIS, ROUTINE W REFLEX MICROSCOPIC
Bilirubin Urine: NEGATIVE
Glucose, UA: NEGATIVE mg/dL
Hgb urine dipstick: NEGATIVE
Ketones, ur: 5 mg/dL — AB
Leukocytes,Ua: NEGATIVE
Nitrite: NEGATIVE
Protein, ur: NEGATIVE mg/dL
Specific Gravity, Urine: 1.029 (ref 1.005–1.030)
pH: 5 (ref 5.0–8.0)

## 2022-06-26 LAB — I-STAT BETA HCG BLOOD, ED (MC, WL, AP ONLY): I-stat hCG, quantitative: 5.5 m[IU]/mL — ABNORMAL HIGH (ref <5)

## 2022-06-26 LAB — LIPASE, BLOOD: Lipase: 29 U/L (ref 11–51)

## 2022-06-26 MED ORDER — LOPERAMIDE HCL 2 MG PO CAPS
4.0000 mg | ORAL_CAPSULE | Freq: Once | ORAL | Status: AC
  Administered 2022-06-26: 4 mg via ORAL
  Filled 2022-06-26: qty 2

## 2022-06-26 MED ORDER — SODIUM CHLORIDE 0.9 % IV BOLUS
1000.0000 mL | Freq: Once | INTRAVENOUS | Status: AC
  Administered 2022-06-26: 1000 mL via INTRAVENOUS

## 2022-06-26 MED ORDER — POTASSIUM CHLORIDE CRYS ER 20 MEQ PO TBCR: 20.0000 meq | EXTENDED_RELEASE_TABLET | Freq: Every day | ORAL | 0 refills | Status: DC

## 2022-06-26 MED ORDER — LOPERAMIDE HCL 2 MG PO CAPS: 2.0000 mg | ORAL_CAPSULE | Freq: Four times a day (QID) | ORAL | 0 refills | Status: DC | PRN

## 2022-06-26 MED ORDER — POTASSIUM CHLORIDE CRYS ER 20 MEQ PO TBCR
40.0000 meq | EXTENDED_RELEASE_TABLET | Freq: Once | ORAL | Status: AC
  Administered 2022-06-26: 40 meq via ORAL
  Filled 2022-06-26: qty 2

## 2022-06-26 NOTE — ED Provider Notes (Signed)
[facility] Provider Note   CSN: [id number] Arrival date & time: 06/26/22  1600     History  Chief Complaint  Patient presents with   Abdominal Pain    Harriet L Zuccaro is a 32 y.o. female here presenting with abdominal cramps and diarrhea.  Patient states that her son was sick with similar symptoms.  For the last 3 days she has been having diarrhea about 6-7 episodes per day.  She states that it is watery.  Denies any recent travel.  She states that she works from home.  Denies any vomiting.  The history is provided by the patient.       Home Medications Prior to Admission medications   Medication Sig Start Date End Date Taking? Authorizing Provider  acetaminophen (TYLENOL) 500 MG tablet Take 500 mg by mouth every 6 (six) hours as needed.    [provider]  benzonatate (TESSALON) 100 MG capsule Take 1 capsule (100 mg total) by mouth 3 (three) times daily as needed for cough. 09/14/19   Antonietta Breach, PA-C  HYDROcodone-homatropine (HYCODAN) 5-1.5 MG/5ML syrup Take 5 mLs by mouth at bedtime as needed for cough. 09/14/19   Antonietta Breach, PA-C  ibuprofen (ADVIL) 600 MG tablet Take 600 mg by mouth every 6 (six) hours as needed.    [provider]  oxyCODONE-acetaminophen (PERCOCET/ROXICET) 5-325 MG tablet Take by mouth every 4 (four) hours as needed for severe pain.    [provider]  Prenatal Vit-Fe Fumarate-FA (PRENATAL VITAMINS PO) Take by mouth.    [provider]  senna-docusate (SENOKOT-S) 8.6-50 MG tablet Take 2 tablets by mouth daily. 06/08/19   Noralyn Pick, FNP      Allergies    Depakote [divalproex sodium], Penicillins, Risperidone and related, Trazodone and nefazodone, Vistaril [hydroxyzine hcl], and Peanut butter flavor    Review of Systems   Review of Systems  Gastrointestinal:  Positive for abdominal pain.  All other systems reviewed and are negative.   Physical Exam Updated Vital  Signs BP 120/76   Pulse 92   Temp 98.8 F (37.1 C) (Oral)   Resp 20   SpO2 99%  Physical Exam Vitals and nursing note reviewed.  Constitutional:      Comments: Slightly dehydrated  HENT:     Head: Normocephalic.  Eyes:     Extraocular Movements: Extraocular movements intact.     Pupils: Pupils are equal, round, and reactive to light.  Cardiovascular:     Rate and Rhythm: Normal rate and regular rhythm.  Pulmonary:     Effort: Pulmonary effort is normal.     Breath sounds: Normal breath sounds.  Abdominal:     General: Abdomen is flat.     Comments: No abdominal tenderness  Skin:    General: Skin is warm.     Capillary Refill: Capillary refill takes less than 2 seconds.  Neurological:     General: No focal deficit present.     Mental Status: She is alert and oriented to person, place, and time.  Psychiatric:        Mood and Affect: Mood normal.        Behavior: Behavior normal.     ED Results / Procedures / Treatments   Labs (all labs ordered are listed, but only abnormal results are displayed) Labs Reviewed  COMPREHENSIVE METABOLIC PANEL - Abnormal; Notable for the following components:      Result Value   Potassium 2.9 (*)  CO2 19 (*)    All other components within normal limits  URINALYSIS, ROUTINE W REFLEX MICROSCOPIC - Abnormal; Notable for the following components:   APPearance HAZY (*)    Ketones, ur 5 (*)    All other components within normal limits  I-STAT BETA HCG BLOOD, ED ([facility], [facility], [facility] ONLY) - Abnormal; Notable for the following components:   I-stat hCG, quantitative 5.5 (*)    All other components within normal limits  LIPASE, BLOOD  CBC  PREGNANCY, URINE    EKG None  Radiology No results found.  Procedures Procedures    Medications Ordered in ED Medications  potassium chloride SA (KLOR-CON M) CR tablet 40 mEq (has no administration in time range)  sodium chloride 0.9 % bolus 1,000 mL (1,000 mLs Intravenous New Bag/Given 06/26/22 1845)     ED Course/ Medical Decision Making/ A&P                             Medical Decision Making YASHA TIBBETT is a 32 y.o. female here with diarrhea and abdominal cramps.  I think likely viral gastroenteritis.  Abdomen is nontender.  Plan to get CBC and CMP and lipase and urinalysis.  7:50 PM UA has some ketones.  Potassium is 2.9 and supplemented.  Patient also given a liter IV fluids and felt better.  Told her to take Imodium as needed.  I have low suspicion for C. difficile.   Problems Addressed: Diarrhea, unspecified type: acute illness or injury Hypokalemia: acute illness or injury  Amount and/or Complexity of Data Reviewed Labs: ordered. Decision-making details documented in ED Course.  Risk Prescription drug management.    Final Clinical Impression(s) / ED Diagnoses Final diagnoses:  None    Rx / DC Orders ED Discharge Orders     None         Drenda Freeze, MD 06/26/22 [phone]

## 2022-06-26 NOTE — Discharge Instructions (Signed)
You likely have a stomach virus  Stay hydrated  Your potassium is low likely from diarrhea.  I have prescribed Imodium and also potassium for you  See your doctor for follow-up  Stay hydrated  Return to ER if you have worse abdominal pain or vomiting or dehydration

## 2022-06-26 NOTE — ED Triage Notes (Addendum)
Patient here with complaint of watery diarrhea since Friday of last week, having 6-7 bowel movements per day. Patient denies abdominal pain. Patient is alert, oriented, and in no apparent distress at this time.  Patient states she works from home but her son was sent home from school last Wednesday for diarrhea.

## 2022-07-15 DIAGNOSIS — H6123 Impacted cerumen, bilateral: Secondary | ICD-10-CM | POA: Diagnosis not present

## 2022-07-15 DIAGNOSIS — J019 Acute sinusitis, unspecified: Secondary | ICD-10-CM | POA: Diagnosis not present

## 2023-04-06 ENCOUNTER — Encounter (HOSPITAL_COMMUNITY): Payer: Self-pay

## 2023-04-06 ENCOUNTER — Other Ambulatory Visit: Payer: Self-pay

## 2023-04-06 ENCOUNTER — Emergency Department (HOSPITAL_COMMUNITY)
Admission: EM | Admit: 2023-04-06 | Discharge: 2023-04-07 | Disposition: A | Discharge status: Home / Self Care | Payer: 59 | Attending: Emergency Medicine | Admitting: Emergency Medicine

## 2023-04-06 DIAGNOSIS — Z91010 Allergy to peanuts: Secondary | ICD-10-CM | POA: Insufficient documentation

## 2023-04-06 DIAGNOSIS — R519 Headache, unspecified: Secondary | ICD-10-CM | POA: Insufficient documentation

## 2023-04-06 DIAGNOSIS — Y92410 Unspecified street and highway as the place of occurrence of the external cause: Secondary | ICD-10-CM | POA: Diagnosis not present

## 2023-04-06 DIAGNOSIS — S161XXA Strain of muscle, fascia and tendon at neck level, initial encounter: Secondary | ICD-10-CM | POA: Insufficient documentation

## 2023-04-06 DIAGNOSIS — O9A211 Injury, poisoning and certain other consequences of external causes complicating pregnancy, first trimester: Secondary | ICD-10-CM | POA: Insufficient documentation

## 2023-04-06 LAB — POC URINE PREG, ED: Preg Test, Ur: POSITIVE — AB
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
## 2023-04-07 MED ORDER — GUAIFENESIN 100 MG/5ML PO LIQD
15.0000 mL | Freq: Once | ORAL | Status: AC
  Administered 2023-04-07: 15 mL via ORAL
  Filled 2023-04-07: qty 20

## 2023-04-07 MED ORDER — ACETAMINOPHEN 500 MG PO TABS
1000.0000 mg | ORAL_TABLET | Freq: Once | ORAL | Status: AC
  Administered 2023-04-07: 1000 mg via ORAL
  Filled 2023-04-07: qty 2

## 2023-04-07 NOTE — ED Notes (Signed)
Order for Guaifenesin, patient reports being aprox [redacted] weeks pregnant, medication order checked with pharmacist, Elizabeth [name]
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
## 2023-04-07 NOTE — Discharge Instructions (Signed)
You may take Tylenol as needed for any aches and pain.  Get plenty of rest, do not do anything strenuous.  Follow-up with your OB/GYN this week.  Return to the ED if you have any vaginal bleeding, increased abdominal pain.

## 2023-04-18 ENCOUNTER — Other Ambulatory Visit: Payer: Self-pay | Admitting: Obstetrics & Gynecology

## 2023-04-20 LAB — DERMATOLOGY PATHOLOGY

## 2023-04-25 LAB — OB RESULTS CONSOLE ABO/RH: RH Type: POSITIVE

## 2023-04-25 LAB — OB RESULTS CONSOLE GC/CHLAMYDIA
Chlamydia: NEGATIVE
Neisseria Gonorrhea: NEGATIVE

## 2023-04-25 LAB — OB RESULTS CONSOLE HEPATITIS B SURFACE ANTIGEN: Hepatitis B Surface Ag: NEGATIVE

## 2023-04-25 LAB — HEPATITIS C ANTIBODY: HCV Ab: NEGATIVE

## 2023-04-25 LAB — OB RESULTS CONSOLE HIV ANTIBODY (ROUTINE TESTING): HIV: NONREACTIVE

## 2023-04-25 LAB — OB RESULTS CONSOLE RUBELLA ANTIBODY, IGM: Rubella: IMMUNE

## 2023-05-19 ENCOUNTER — Emergency Department (HOSPITAL_BASED_OUTPATIENT_CLINIC_OR_DEPARTMENT_OTHER)
Admission: EM | Admit: 2023-05-19 | Discharge: 2023-05-20 | Disposition: A | Discharge status: Home / Self Care | Payer: 59 | Attending: Emergency Medicine | Admitting: Emergency Medicine

## 2023-05-19 ENCOUNTER — Other Ambulatory Visit: Payer: Self-pay

## 2023-05-19 DIAGNOSIS — K6289 Other specified diseases of anus and rectum: Secondary | ICD-10-CM | POA: Insufficient documentation

## 2023-05-19 DIAGNOSIS — Z91010 Allergy to peanuts: Secondary | ICD-10-CM | POA: Insufficient documentation

## 2023-05-19 DIAGNOSIS — K5900 Constipation, unspecified: Secondary | ICD-10-CM | POA: Insufficient documentation

## 2023-05-19 DIAGNOSIS — Z3A09 9 weeks gestation of pregnancy: Secondary | ICD-10-CM | POA: Diagnosis not present

## 2023-05-19 DIAGNOSIS — O99611 Diseases of the digestive system complicating pregnancy, first trimester: Secondary | ICD-10-CM | POA: Diagnosis not present

## 2023-05-19 MED ORDER — LIDOCAINE HCL URETHRAL/MUCOSAL 2 % EX GEL: 1.0000 | Freq: Once | CUTANEOUS | Status: DC

## 2023-05-19 NOTE — ED Notes (Signed)
Md in triage for evaluation. No evidence of prolapsed rectum at this time.

## 2023-05-19 NOTE — ED Provider Notes (Signed)
[place] EMERGENCY DEPARTMENT AT [facility] Provider Note   CSN: [id number] Arrival date & time: 05/19/23  1942     History {Add pertinent medical, surgical, social history, OB history to HPI:1} No chief complaint on file.   ELOYSE HIBMA is a 32 y.o. female with PMH as listed below who presents with rectal/anal pain.    Pt POV from home reporting concern for prolapsed rectum. Pt was having bowel movement when she felt something extra come out. States that she reached behind her to feel what it was and was concerned that her bottom had come out. She states it is now extremely painful, unable to sit. She states she feels like she needs to have a bowel movement. LBM was yesterday, and it was normal for her. She doesn't normally deal with constipation. Patient also notes she is 3.5 months pregnant. She is G2 P1. No vaginal bleeding, no gushes of fluid.    Past Medical History:  Diagnosis Date   Anxiety    Bipolar 1 disorder (HCC)    Chlamydia infection 2005, 2008   Recurrent vaginitis        Home Medications Prior to Admission medications   Medication Sig Start Date End Date Taking? Authorizing Provider  acetaminophen (TYLENOL) 500 MG tablet Take 500 mg by mouth every 6 (six) hours as needed.    [provider]  benzonatate (TESSALON) 100 MG capsule Take 1 capsule (100 mg total) by mouth 3 (three) times daily as needed for cough. 09/14/19   Antony Madura, PA-C  HYDROcodone-homatropine (HYCODAN) 5-1.5 MG/5ML syrup Take 5 mLs by mouth at bedtime as needed for cough. 09/14/19   Antony Madura, PA-C  ibuprofen (ADVIL) 600 MG tablet Take 600 mg by mouth every 6 (six) hours as needed.    [provider]  loperamide (IMODIUM) 2 MG capsule Take 1 capsule (2 mg total) by mouth 4 (four) times daily as needed for diarrhea or loose stools. 06/26/22   Charlynne Pander, MD  oxyCODONE-acetaminophen (PERCOCET/ROXICET) 5-325 MG tablet Take by mouth every 4 (four) hours  as needed for severe pain.    [provider]  potassium chloride SA (KLOR-CON M) 20 MEQ tablet Take 1 tablet (20 mEq total) by mouth daily. 06/26/22   Charlynne Pander, MD  Prenatal Vit-Fe Fumarate-FA (PRENATAL VITAMINS PO) Take by mouth.    [provider]  senna-docusate (SENOKOT-S) 8.6-50 MG tablet Take 2 tablets by mouth daily. 06/08/19   Dale Bowman, FNP      Allergies    Depakote [divalproex sodium], Penicillins, Risperidone and related, Trazodone and nefazodone, Vistaril [hydroxyzine hcl], and Peanut butter flavoring agent (non-screening)    Review of Systems   Review of Systems A 10 point review of systems was performed and is negative unless otherwise reported in HPI.  Physical Exam Updated Vital Signs BP (!) 97/50   Pulse 98   Temp 98 F (36.7 C)   Resp 20   Ht 5\' 4"  (1.626 m)   Wt 99.8 kg   LMP 02/01/2023 (Approximate)   SpO2 99%   BMI 37.76 kg/m  Physical Exam General: Uncomfortable appearing female, lying in bed on her side. HEENT: Sclera anicteric, MMM, trachea midline.  Cardiology: RRR, no murmurs/rubs/gallops.  Resp: Normal respiratory rate and effort. CTAB, no wheezes, rhonchi, crackles.  Abd: Gravid. Soft, non-tender, non-distended. No rebound tenderness or guarding.  Rectal: Performed with nurse chaperone. Normal appearing external anal sphincter. No anal fissures or external hemorrhoids noted. No internal hemorrhoids  palpated. No induration, fluctuance or wounds noted. Hard stool balls palpated in rectal vault. Brown stool with no gross blood on glove.  MSK: No peripheral edema or signs of trauma. Extremities without deformity or TTP.  Skin: warm, dry.  Back: No CVA tenderness Neuro: A&Ox4, CNs II-XII grossly intact. MAEs.  Psych: Normal mood and affect.   ED Results / Procedures / Treatments   Labs (all labs ordered are listed, but only abnormal results are displayed) Labs Reviewed - No data to display  EKG None  Radiology No  results found.  Procedures Procedures  {Document cardiac monitor, telemetry assessment procedure when appropriate:1}  Medications Ordered in ED Medications  lidocaine (XYLOCAINE) 2 % jelly 1 Application (has no administration in time range)    ED Course/ Medical Decision Making/ A&P                          Medical Decision Making   This patient presents to the ED for concern of rectal pain, this involves an extensive number of treatment options, and is a complaint that carries with it a high risk of complications and morbidity.  I considered the following differential and admission for this acute, potentially life threatening condition.   MDM:    No rectal prolapse, prolapsed hemorrhoids, or anal fissures noted on external exam. Hard stool palpated in rectal vault. Patient likely with rectal pain d/t constipation or fecal impaction. Discussed with patient options for treatment and she elects to proceed with enema. She notes that she is pregnant and has not had any complications in her pregnancy so far. ***      Additional history obtained from chart review.    Reevaluation: After the interventions noted above, I reevaluated the patient and found that they have :stayed the same  Social Determinants of Health: Lives independently  Disposition:  ***  Co morbidities that complicate the patient evaluation  Past Medical History:  Diagnosis Date   Anxiety    Bipolar 1 disorder (HCC)    Chlamydia infection 2005, 2008   Recurrent vaginitis      Medicines Meds ordered this encounter  Medications   lidocaine (XYLOCAINE) 2 % jelly 1 Application    I have reviewed the patients home medicines and have made adjustments as needed  Problem List / ED Course: Problem List Items Addressed This Visit   None        {Document critical care time when appropriate:1} {Document review of labs and clinical decision tools ie heart score, Chads2Vasc2 etc:1}  {Document your  independent review of radiology images, and any outside records:1} {Document your discussion with family members, caretakers, and with consultants:1} {Document social determinants of health affecting pt's care:1} {Document your decision making why or why not admission, treatments were needed:1}  This note was created using dictation software, which may contain spelling or grammatical errors.

## 2023-05-19 NOTE — ED Triage Notes (Signed)
Pt POV from home reporting prolapsed rectum. Pt was having bowel movement when she felt something extra come out, now extremely painful, unable to sit.

## 2023-05-20 NOTE — ED Notes (Signed)
Pt had bowel movement with soap suds enema. MD made aware

## 2023-06-26 DIAGNOSIS — Z363 Encounter for antenatal screening for malformations: Secondary | ICD-10-CM | POA: Diagnosis not present

## 2023-06-26 DIAGNOSIS — Z3A20 20 weeks gestation of pregnancy: Secondary | ICD-10-CM | POA: Diagnosis not present

## 2023-07-27 DIAGNOSIS — Z363 Encounter for antenatal screening for malformations: Secondary | ICD-10-CM | POA: Diagnosis not present

## 2023-07-27 DIAGNOSIS — J45909 Unspecified asthma, uncomplicated: Secondary | ICD-10-CM | POA: Diagnosis not present

## 2023-07-27 DIAGNOSIS — Z880 Allergy status to penicillin: Secondary | ICD-10-CM | POA: Diagnosis not present

## 2023-07-27 DIAGNOSIS — F17200 Nicotine dependence, unspecified, uncomplicated: Secondary | ICD-10-CM | POA: Diagnosis not present

## 2023-07-27 DIAGNOSIS — Z3A25 25 weeks gestation of pregnancy: Secondary | ICD-10-CM | POA: Diagnosis not present

## 2023-07-27 DIAGNOSIS — N83209 Unspecified ovarian cyst, unspecified side: Secondary | ICD-10-CM | POA: Diagnosis not present

## 2023-07-27 DIAGNOSIS — Z3482 Encounter for supervision of other normal pregnancy, second trimester: Secondary | ICD-10-CM | POA: Diagnosis not present

## 2023-07-27 DIAGNOSIS — R87810 Cervical high risk human papillomavirus (HPV) DNA test positive: Secondary | ICD-10-CM | POA: Diagnosis not present

## 2023-07-27 DIAGNOSIS — Z369 Encounter for antenatal screening, unspecified: Secondary | ICD-10-CM | POA: Diagnosis not present

## 2023-07-27 DIAGNOSIS — F319 Bipolar disorder, unspecified: Secondary | ICD-10-CM | POA: Diagnosis not present

## 2023-07-27 DIAGNOSIS — O99212 Obesity complicating pregnancy, second trimester: Secondary | ICD-10-CM | POA: Diagnosis not present

## 2023-07-27 LAB — OB RESULTS CONSOLE RPR: RPR: NONREACTIVE

## 2023-07-27 LAB — OB RESULTS CONSOLE HIV ANTIBODY (ROUTINE TESTING): HIV: NONREACTIVE

## 2023-09-08 DIAGNOSIS — H6123 Impacted cerumen, bilateral: Secondary | ICD-10-CM | POA: Diagnosis not present

## 2023-09-08 DIAGNOSIS — H9203 Otalgia, bilateral: Secondary | ICD-10-CM | POA: Diagnosis not present

## 2023-09-14 DIAGNOSIS — O99210 Obesity complicating pregnancy, unspecified trimester: Secondary | ICD-10-CM | POA: Diagnosis not present

## 2023-09-14 DIAGNOSIS — Z3A32 32 weeks gestation of pregnancy: Secondary | ICD-10-CM | POA: Diagnosis not present

## 2023-09-14 DIAGNOSIS — Z364 Encounter for antenatal screening for fetal growth retardation: Secondary | ICD-10-CM | POA: Diagnosis not present

## 2023-10-03 ENCOUNTER — Inpatient Hospital Stay (HOSPITAL_COMMUNITY)
Admission: AD | Admit: 2023-10-03 | Discharge: 2023-10-03 | Disposition: A | Discharge status: Home / Self Care | Attending: Obstetrics and Gynecology | Admitting: Obstetrics and Gynecology

## 2023-10-03 ENCOUNTER — Encounter (HOSPITAL_COMMUNITY): Payer: Self-pay | Admitting: *Deleted

## 2023-10-03 DIAGNOSIS — Z3A34 34 weeks gestation of pregnancy: Secondary | ICD-10-CM | POA: Diagnosis not present

## 2023-10-03 DIAGNOSIS — O26893 Other specified pregnancy related conditions, third trimester: Secondary | ICD-10-CM

## 2023-10-03 DIAGNOSIS — Z0371 Encounter for suspected problem with amniotic cavity and membrane ruled out: Secondary | ICD-10-CM | POA: Diagnosis not present

## 2023-10-03 LAB — RUPTURE OF MEMBRANE (ROM)PLUS: Rom Plus: NEGATIVE

## 2023-10-03 LAB — WET PREP, GENITAL
Sperm: NONE SEEN
Trich, Wet Prep: NONE SEEN
WBC, Wet Prep HPF POC: <10 (ref <10)
Yeast Wet Prep HPF POC: NONE SEEN

## 2023-10-03 LAB — POCT FERN TEST: POCT Fern Test: NEGATIVE

## 2023-10-03 NOTE — MAU Provider Note (Signed)
 History     CSN: [id number]  Arrival date and time: 10/03/23 0940   Event Date/Time   First Provider Initiated Contact with Patient 10/03/23 1042      Chief Complaint  Patient presents with   Rupture of Membranes   HPI [name] is a 33 y.o. year old [id number] female at [redacted]w[redacted]d weeks gestation who presents to MAU reporting she noticed leaking of fluid as she stood at the kitchen sink washing dishes. She reports she had used the BR just before going in the kitchen to prepare breakfast. She reports she called her OB office to let them know what happened. She states, "They told me it could either be my mucous plug or my water  breaking, so they wanted me to come here to get checked out." She still feels wet at this time, but admits "it's always wet down there." She denies contractions, but reports increased pelvic pressure. She reports good (+) FM this morning. She receives [facility]; next appt is 10/12/2023 .  OB History     Gravida  3   Para  1   Term  1   Preterm  0   AB  1   Living  1      SAB  0   IAB  1   Ectopic  0   Multiple  0   Live Births  1           Past Medical History:  Diagnosis Date   Anxiety    Bipolar 1 disorder (HCC)    Chlamydia infection 2005, 2008   Recurrent vaginitis     Past Surgical History:  Procedure Laterality Date   ABSCESS DRAINAGE     left groin   CESAREAN SECTION N/A 06/04/2019   Procedure: CESAREAN SECTION;  Surgeon: Vernal Gold, MD;  Location: [facility] LD ORS;  Service: Obstetrics;  Laterality: N/A;   HEEL SPUR SURGERY     INCISION AND DRAINAGE ABSCESS Left 03/03/2016   Procedure: INCISION AND DRAINAGE ABSCESS;  Surgeon: Alben Alma, MD;  Location: [facility] ORS;  Service: General;  Laterality: Left;   right foot surgery     TONSILLECTOMY AND ADENOIDECTOMY      Family History  Problem Relation Age of Onset   Hypertension Mother    Thyroid  cancer Mother    Hypertension Father    Alcohol abuse  Paternal Grandfather    Hypertension Maternal Grandmother    Cancer Maternal Grandmother    Heart Problems Paternal Grandmother     Social History   Tobacco Use   Smoking status: Former    Current packs/day: 0.50    Types: Cigarettes   Smokeless tobacco: Never  Vaping Use   Vaping status: Never Used  Substance Use Topics   Alcohol use: Not Currently    Alcohol/week: 1.0 standard drink of alcohol    Types: 1 Glasses of wine per week   Drug use: Not Currently    Types: Marijuana    Allergies:  Allergies  Allergen Reactions   Depakote  [Divalproex  Sodium] Anaphylaxis   Penicillins Anaphylaxis and Other (See Comments)    Has patient had a PCN reaction causing immediate rash, facial/tongue/throat swelling, SOB or lightheadedness with hypotension: Yes Has patient had a PCN reaction causing severe rash involving mucus membranes or skin necrosis: No Has patient had a PCN reaction that required hospitalization No Has patient had a PCN reaction occurring within the last 10 years: No If all of the above  answers are "NO", then may proceed with Cephalosporin use.   Risperidone  And Related Other (See Comments)    Weight gain   Trazodone  And Nefazodone Other (See Comments)    Pt states that she does not like how this medication makes her feel.    Vistaril  [Hydroxyzine  Hcl] Itching   Peanut Butter Flavoring Agent (Non-Screening) Itching and Rash    Reaction to peanut butter only, tolerates peanuts.    Medications Prior to Admission  Medication Sig Dispense Refill Last Dose/Taking   aspirin EC 81 MG tablet Take 81 mg by mouth daily. Swallow whole.   10/02/2023   Prenatal Vit-Fe Fumarate-FA (PRENATAL VITAMINS PO) Take by mouth.   10/02/2023 Morning   acetaminophen  (TYLENOL ) 500 MG tablet Take 500 mg by mouth every 6 (six) hours as needed.      benzonatate  (TESSALON ) 100 MG capsule Take 1 capsule (100 mg total) by mouth 3 (three) times daily as needed for cough. 21 capsule 0     HYDROcodone -homatropine (HYCODAN) 5-1.5 MG/5ML syrup Take 5 mLs by mouth at bedtime as needed for cough. 60 mL 0    ibuprofen  (ADVIL ) 600 MG tablet Take 600 mg by mouth every 6 (six) hours as needed.      loperamide  (IMODIUM ) 2 MG capsule Take 1 capsule (2 mg total) by mouth 4 (four) times daily as needed for diarrhea or loose stools. 30 capsule 0    oxyCODONE -acetaminophen  (PERCOCET/ROXICET) 5-325 MG tablet Take by mouth every 4 (four) hours as needed for severe pain.      potassium chloride  SA (KLOR-CON  M) 20 MEQ tablet Take 1 tablet (20 mEq total) by mouth daily. 3 tablet 0    senna-docusate (SENOKOT-S) 8.6-50 MG tablet Take 2 tablets by mouth daily. 30 tablet 0     Review of Systems  Constitutional: Negative.   HENT: Negative.    Eyes: Negative.   Respiratory: Negative.    Cardiovascular: Negative.   Gastrointestinal: Negative.   Endocrine: Negative.   Genitourinary:  Positive for pelvic pain (increased pressure) and vaginal discharge.  Musculoskeletal: Negative.   Skin: Negative.   Allergic/Immunologic: Negative.   Neurological: Negative.   Hematological: Negative.   Psychiatric/Behavioral: Negative.     Physical Exam   Blood pressure 119/71, pulse (!) 116, temperature 98.7 F (37.1 C), resp. rate 18, last menstrual period 02/01/2023, unknown if currently breastfeeding.  Physical Exam Vitals and nursing note reviewed. Exam conducted with a chaperone present.  Constitutional:      Appearance: Normal appearance. She is obese.  Cardiovascular:     Rate and Rhythm: Tachycardia present.  Pulmonary:     Effort: Pulmonary effort is normal.  Abdominal:     Palpations: Abdomen is soft.  Genitourinary:    General: Normal vulva.     Comments: Pelvic exam: External genitalia normal, SE: vaginal walls pink and well rugated, cervix is smooth, pink, no lesions, small amt of thin, white vaginal d/c -- fern slide, WP, GC/CT, and ROM+ swabs done, cervix visually closed. Cervical exam  deferred.  Musculoskeletal:        General: Normal range of motion.  Skin:    General: Skin is warm and dry.  Neurological:     Mental Status: She is alert and oriented to person, place, and time.  Psychiatric:        Mood and Affect: Mood normal.        Behavior: Behavior normal.        Thought Content: Thought content normal.  Judgment: Judgment normal.    REACTIVE NST - FHR: 130 bpm / moderate variability / accels present / decels absent / TOCO: none  MAU Course  Procedures  MDM Wet Prep GC/CT -- Results pending  Fern Slide ROM+  Results for orders placed or performed during the hospital encounter of 10/03/23 (from the past 24 hours)  Rupture of Membrane (ROM) Plus     Status: None   Collection Time: 10/03/23 10:43 AM  Result Value Ref Range   Rom Plus NEGATIVE   POCT fern test     Status: None   Collection Time: 10/03/23 11:00 AM  Result Value Ref Range   POCT Fern Test Negative = intact amniotic membranes   Wet prep, genital     Status: Abnormal   Collection Time: 10/03/23 11:05 AM  Result Value Ref Range   Yeast Wet Prep HPF POC NONE SEEN NONE SEEN   Trich, Wet Prep NONE SEEN NONE SEEN   Clue Cells Wet Prep HPF POC PRESENT (A) NONE SEEN   WBC, Wet Prep HPF POC <10 <10   Sperm NONE SEEN     Assessment and Plan  1. No leakage of amniotic fluid into vagina (Primary) - Reassurance given that her water  was not broken - Informed that it is not unusual for vaginal discharge to become thinner and/or increase in amount at this gestation in pregnancy  2. Vaginal discharge during pregnancy in third trimester - Informed that the tests done today were negative for any vaginal infections   3. [redacted] weeks gestation of pregnancy   - Discharge home - Keep scheduled appt with [facility] on 10/12/2023 - Patient verbalized an understanding of the plan of care and agrees.   Almond Army, CNM 10/03/2023, 11:01 AM

## 2023-10-03 NOTE — MAU Note (Signed)
.  Elizabeth [name] is a 33 y.o. at [redacted]w[redacted]d here in MAU reporting: stated she went to Restroom this morning. Emptied her bladder completely. Stood up and went to the kitchen to do the dishes and some fluid leaked out . Still feels wet now. Reports increase pelvic pressure and good fetal movement.   LMP:  Onset of complaint: 845am Pain score: 7 Vitals:   10/03/23 1002  BP: 119/71  Pulse: (!) 116  Resp: 18  Temp: 98.7 F (37.1 C)     FHT: 165  Lab orders placed from triage: u/a

## 2023-10-03 NOTE — Discharge Instructions (Signed)
 The tests done today showed that your water  is NOT broken nor do you have any vaginal infections.   It is not unusual for vaginal discharge to become thinner and/or increase in amount at this gestation in pregnancy.

## 2023-10-04 LAB — GC/CHLAMYDIA PROBE AMP (~~LOC~~) NOT AT ARMC
Chlamydia: NEGATIVE
Comment: NEGATIVE
Comment: NORMAL
Neisseria Gonorrhea: NEGATIVE

## 2023-10-09 ENCOUNTER — Other Ambulatory Visit: Payer: Self-pay | Admitting: Obstetrics & Gynecology

## 2023-10-12 DIAGNOSIS — Z3A36 36 weeks gestation of pregnancy: Secondary | ICD-10-CM | POA: Diagnosis not present

## 2023-10-12 DIAGNOSIS — Z364 Encounter for antenatal screening for fetal growth retardation: Secondary | ICD-10-CM | POA: Diagnosis not present

## 2023-10-12 DIAGNOSIS — O99210 Obesity complicating pregnancy, unspecified trimester: Secondary | ICD-10-CM | POA: Diagnosis not present

## 2023-10-18 LAB — OB RESULTS CONSOLE GBS: GBS: NEGATIVE

## 2023-10-24 ENCOUNTER — Encounter (HOSPITAL_COMMUNITY): Payer: Self-pay

## 2023-10-24 DIAGNOSIS — Z364 Encounter for antenatal screening for fetal growth retardation: Secondary | ICD-10-CM | POA: Diagnosis not present

## 2023-10-24 DIAGNOSIS — Z3A37 37 weeks gestation of pregnancy: Secondary | ICD-10-CM | POA: Diagnosis not present

## 2023-10-24 NOTE — Patient Instructions (Signed)
 Elizabeth [name]  10/24/2023   Your procedure is scheduled on:  11/02/2023  Arrive at 1215 at Entrance C on [address] at [facility]  and [employer]. You are invited to use the FREE valet parking or use the Visitor's parking deck.  Pick up the phone at the desk and dial [phone].  Call this number if you have problems the morning of surgery: [phone]  Remember:   Do not eat food:(After Midnight) Desps de medianoche.  You may drink clear liquids until arrival at ___1015__.  Clear liquids means a liquid you can see thru.  It can have color such as Cola or Kool aid.  Tea is OK and coffee as long as no milk or creamer of any kind.  Take these medicines the morning of surgery with A SIP OF WATER :  none   Do not wear jewelry, make-up or nail polish.  Do not wear lotions, powders, or perfumes. Do not wear deodorant.  Do not shave 48 hours prior to surgery.  Do not bring valuables to the hospital.  [facility] is not   responsible for any belongings or valuables brought to the hospital.  Contacts, dentures or bridgework may not be worn into surgery.  Leave suitcase in the car. After surgery it may be brought to your room.  For patients admitted to the hospital, checkout time is 11:00 AM the day of              discharge.      Please read over the following fact sheets that you were given:     Preparing for Surgery

## 2023-11-01 ENCOUNTER — Encounter (HOSPITAL_COMMUNITY)
Admission: RE | Admit: 2023-11-01 | Discharge: 2023-11-01 | Disposition: A | Discharge status: Home / Self Care | Source: Ambulatory Visit | Attending: Obstetrics & Gynecology | Admitting: Obstetrics & Gynecology

## 2023-11-01 DIAGNOSIS — Z01812 Encounter for preprocedural laboratory examination: Secondary | ICD-10-CM | POA: Insufficient documentation

## 2023-11-01 LAB — CBC
HCT: 38.8 % (ref 36.0–46.0)
Hemoglobin: 13.3 g/dL (ref 12.0–15.0)
MCH: 31 pg (ref 26.0–34.0)
MCHC: 34.3 g/dL (ref 30.0–36.0)
MCV: 90.4 fL (ref 80.0–100.0)
Platelets: 174 10*3/uL (ref 150–400)
RBC: 4.29 MIL/uL (ref 3.87–5.11)
RDW: 14.3 % (ref 11.5–15.5)
WBC: 7 10*3/uL (ref 4.0–10.5)
nRBC: 0 % (ref 0.0–0.2)

## 2023-11-01 LAB — RPR: RPR Ser Ql: NONREACTIVE

## 2023-11-01 LAB — TYPE AND SCREEN
ABO/RH(D): O POS
Antibody Screen: NEGATIVE

## 2023-11-01 NOTE — H&P (Signed)
 Elizabeth [name] is a 33 y.o. female presenting for a repeat Cesarean section and permanent sterilization via bilateral salpingectomy.  [id number] at 39 weeks 1 day EGA, with LMP 02/01/2023, EDC 11/08/23 by LMP c/w first trimester ultrasound.  Prenatal care was provided at [facility] and was significant for: High maternal BMI, with prepregnancy BMI of 37, with normal fetal growth and normal fetal testing:  OB History     Gravida  3   Para  1   Term  1   Preterm  0   AB  1   Living  1      SAB  0   IAB  1   Ectopic  0   Multiple  0   Live Births  1          Past Medical History:  Diagnosis Date   Anxiety    Bipolar 1 disorder (HCC)    Chlamydia infection 2005, 2008   Recurrent vaginitis    Past Surgical History:  Procedure Laterality Date   ABSCESS DRAINAGE     left groin   CESAREAN SECTION N/A 06/04/2019   Procedure: CESAREAN SECTION;  Surgeon: Vernal Gold, MD;  Location: [facility] LD ORS;  Service: Obstetrics;  Laterality: N/A;   HEEL SPUR SURGERY     INCISION AND DRAINAGE ABSCESS Left 03/03/2016   Procedure: INCISION AND DRAINAGE ABSCESS;  Surgeon: Alben Alma, MD;  Location: [facility] ORS;  Service: General;  Laterality: Left;   right foot surgery     TONSILLECTOMY AND ADENOIDECTOMY     Family History: family history includes Alcohol abuse in her paternal grandfather; Cancer in her maternal grandmother; Heart Problems in her paternal grandmother; Hypertension in her father, maternal grandmother, and mother; Thyroid  cancer in her mother. Social History:  reports that she has quit smoking. Her smoking use included cigarettes. She has never used smokeless tobacco. She reports that she does not currently use alcohol after a past usage of about 1.0 standard drink of alcohol per week. She reports that she does not currently use drugs after having used the following drugs: Marijuana.     Maternal Diabetes: No Genetic Screening: Normal Maternal Ultrasounds/Referrals:  Normal.  09/14/23: EFW 4.7 lbs.  Fundal placenta.  AFI 10 cm.  Fetal Ultrasounds or other Referrals:  None Maternal Substance Abuse:  No Significant Maternal Medications:  None Significant Maternal Lab Results:  Group B Strep negative Number of Prenatal Visits:greater than 3 verified prenatal visits Maternal Vaccinations: None Other Comments:  None  Review of Systems History Constitutional: Denies fevers/chills Cardiovascular: Denies chest pain or palpitations Pulmonary: Denies coughing or wheezing Gastrointestinal: Denies nausea, vomiting or diarrhea Genitourinary: Denies pelvic pain, unusual vaginal bleeding, unusual vaginal discharge, dysuria, urgency or frequency.  Musculoskeletal: Denies muscle or joint aches and pain.  Neurology: Denies abnormal sensations such as tingling or numbness.     Last menstrual period 02/01/2023, unknown if currently breastfeeding. Exam Physical Exam  Constitutional: She is oriented to person, place, and time. She appears well-developed and well-nourished.  HENT:  Head: Normocephalic and atraumatic.  Neck: Normal range of motion.  Cardiovascular: Normal rate.    Respiratory: Effort normal.   GI: Soft.  Skin: Skin is warm and dry.  Psychiatric: She has a normal mood and affect. Her behavior is normal.   Genitourinary: Gravid uterus, appropriate for gestational age.     Current Outpatient Medications  Medication Instructions   aspirin EC 81 mg, Daily   loratadine  (CLARITIN ) 10 mg, Oral, Daily  Multiple Vitamins-Minerals (MULTIVITAMIN WITH MINERALS) tablet 1 tablet, Oral, Daily   Prenatal Vit-Fe Fumarate-FA (PRENATAL VITAMINS PO) 1 tablet, Daily   Vitamin D  4,000 Units, Oral, Daily    Allergies  Allergen Reactions   Depakote  [Divalproex  Sodium] Anaphylaxis   Penicillins Anaphylaxis and Other (See Comments)    Has patient had a PCN reaction causing immediate rash, facial/tongue/throat swelling, SOB or lightheadedness with hypotension: Yes Has  patient had a PCN reaction causing severe rash involving mucus membranes or skin necrosis: No Has patient had a PCN reaction that required hospitalization No Has patient had a PCN reaction occurring within the last 10 years: No If all of the above answers are "NO", then may proceed with Cephalosporin use.   Risperidone  And Related Other (See Comments)    Weight gain   Trazodone  And Nefazodone Other (See Comments)    Pt states that she does not like how this medication makes her feel.    Vistaril  [Hydroxyzine  Hcl] Itching   Peanut Butter Flavoring Agent (Non-Screening) Itching and Rash    Reaction to peanut butter only, tolerates peanuts.    Prenatal labs: ABO, Rh: --/--/O POS (05/29 3474) Antibody: NEG (05/29 0923) Rubella:  Immune RPR: NON REACTIVE (05/29 0912)  HBsAg:   Neg HIV:   Neg GBS:   Neg  Recent Results (from the past 2160 hours)  Rupture of Membrane (ROM) Plus     Status: None   Collection Time: 10/03/23 10:43 AM  Result Value Ref Range   Rom Plus NEGATIVE     Comment: Performed at [facility], [address] [place], [place] [address]  POCT fern test     Status: None   Collection Time: 10/03/23 11:00 AM  Result Value Ref Range   POCT Fern Test Negative = intact amniotic membranes   GC/Chlamydia probe amp ([place])not at [facility]     Status: None   Collection Time: 10/03/23 11:00 AM  Result Value Ref Range   Neisseria Gonorrhea Negative    Chlamydia Negative    Comment Normal Reference Ranger Chlamydia - Negative    Comment      Normal Reference Range Neisseria Gonorrhea - Negative  Wet prep, genital     Status: Abnormal   Collection Time: 10/03/23 11:05 AM  Result Value Ref Range   Yeast Wet Prep HPF POC NONE SEEN NONE SEEN   Trich, Wet Prep NONE SEEN NONE SEEN   Clue Cells Wet Prep HPF POC PRESENT (A) NONE SEEN   WBC, Wet Prep HPF POC <10 <10   Sperm NONE SEEN     Comment: Performed at [facility], [address], [place], [place] [address]   CBC     Status: None   Collection Time: 11/01/23  9:12 AM  Result Value Ref Range   WBC 7.0 4.0 - 10.5 K/uL   RBC 4.29 3.87 - 5.11 MIL/uL   Hemoglobin 13.3 12.0 - 15.0 g/dL   HCT 43.3 29.5 - 18.8 %   MCV 90.4 80.0 - 100.0 fL   MCH 31.0 26.0 - 34.0 pg   MCHC 34.3 30.0 - 36.0 g/dL   RDW 41.6 60.6 - 30.1 %   Platelets 174 150 - 400 K/uL   nRBC 0.0 0.0 - 0.2 %    Comment: Performed at [facility], [address] [place], [place] [address]  RPR     Status: None   Collection Time: 11/01/23  9:12 AM  Result Value Ref Range  RPR Ser Ql NON REACTIVE NON REACTIVE    Comment: Performed at [facility], [address] [place], [place] [address]  Type and screen     Status: None   Collection Time: 11/01/23  9:23 AM  Result Value Ref Range   ABO/RH(D) O POS    Antibody Screen NEG    Sample Expiration      11/04/2023,2359 Performed at [facility], [address] [place], [place] [address]    Assessment/Plan:  33 y/o G3P1011 at 39 weeks 1 day EGA here for a repeat cesarean section and bilateral salpingectomy for sterilization, - Admit to [facility] OR as per admit orders.  - Clindamycin  gentamicin  for surgical prophylaxis.   - These procedures have been fully reviewed with the patient and written informed consent has been obtained. Discussed risks, benefits and alternatives of the cesarean section. Risks include but are not limited to risks of bleeding, infection, damage to other organs and possible need of additional procedures, possible placenta problems in subsequent pregnancies.  Patient plans to use bilateral tubal ligation via complete bilateral salpingectomy for sterilization.  We discussed risks, benefits and alternatives of postpartum tubal ligation including but not limited to risks of bleeding, infection, damage to organs.  She also understood the risk of tubal regret but she states she is 100% sure she did not want any more children.  We discussed that complete  removal of both tubes will ensure complete sterilization but as with every procedure it is not 100% guaranteed.  She understood there were other kinds of birth control such as pills, patches, IUDs, vaginal rings and depo provera which were temporary but she did not desire them.  She understood there was also an option of female sterilization but she did not desire that option either.  She did not desire partial salpingectomy or tubal fulguration or placement of tubal clips.  All her questions were answered and she was consented for the procedure.  She expressed understanding that if she does have irregular or heavy bleeding after the sterilization she may require hormone control in the form of birth control devices or medication to regulate her cycles.       Charlott Converse, MD.  11/01/2023, 6:44 PM

## 2023-11-02 ENCOUNTER — Inpatient Hospital Stay (HOSPITAL_COMMUNITY): Admitting: Anesthesiology

## 2023-11-02 ENCOUNTER — Inpatient Hospital Stay (HOSPITAL_COMMUNITY)
Admission: RE | Admit: 2023-11-02 | Discharge: 2023-11-05 | DRG: 785 | Disposition: A | Discharge status: Home / Self Care | Payer: Self-pay | Source: Ambulatory Visit | Attending: Obstetrics & Gynecology | Admitting: Obstetrics & Gynecology

## 2023-11-02 ENCOUNTER — Other Ambulatory Visit: Payer: Self-pay

## 2023-11-02 ENCOUNTER — Encounter (HOSPITAL_COMMUNITY): Payer: Self-pay | Admitting: Obstetrics & Gynecology

## 2023-11-02 ENCOUNTER — Encounter (HOSPITAL_COMMUNITY)
Admission: RE | Disposition: A | Discharge status: Home / Self Care | Payer: Self-pay | Source: Ambulatory Visit | Attending: Obstetrics & Gynecology

## 2023-11-02 DIAGNOSIS — Z880 Allergy status to penicillin: Secondary | ICD-10-CM

## 2023-11-02 DIAGNOSIS — Z302 Encounter for sterilization: Secondary | ICD-10-CM

## 2023-11-02 DIAGNOSIS — Z3A39 39 weeks gestation of pregnancy: Secondary | ICD-10-CM | POA: Diagnosis not present

## 2023-11-02 DIAGNOSIS — E66813 Obesity, class 3: Secondary | ICD-10-CM | POA: Diagnosis not present

## 2023-11-02 DIAGNOSIS — Z9889 Other specified postprocedural states: Principal | ICD-10-CM

## 2023-11-02 DIAGNOSIS — Z87891 Personal history of nicotine dependence: Secondary | ICD-10-CM

## 2023-11-02 DIAGNOSIS — O326XX0 Maternal care for compound presentation, not applicable or unspecified: Secondary | ICD-10-CM | POA: Diagnosis present

## 2023-11-02 DIAGNOSIS — Z8249 Family history of ischemic heart disease and other diseases of the circulatory system: Secondary | ICD-10-CM | POA: Diagnosis not present

## 2023-11-02 DIAGNOSIS — O99214 Obesity complicating childbirth: Secondary | ICD-10-CM | POA: Diagnosis present

## 2023-11-02 DIAGNOSIS — O34211 Maternal care for low transverse scar from previous cesarean delivery: Secondary | ICD-10-CM

## 2023-11-02 DIAGNOSIS — O34219 Maternal care for unspecified type scar from previous cesarean delivery: Principal | ICD-10-CM | POA: Diagnosis not present

## 2023-11-02 DIAGNOSIS — Z6841 Body Mass Index (BMI) 40.0 and over, adult: Secondary | ICD-10-CM | POA: Diagnosis not present

## 2023-11-02 SURGERY — Surgical Case
Anesthesia: Spinal

## 2023-11-02 MED ORDER — DIPHENHYDRAMINE HCL 50 MG/ML IJ SOLN: 12.5000 mg | INTRAMUSCULAR | Status: DC | PRN

## 2023-11-02 MED ORDER — CLINDAMYCIN PHOSPHATE 900 MG/50ML IV SOLN
900.0000 mg | Freq: Once | INTRAVENOUS | Status: AC
  Administered 2023-11-02: 900 mg via INTRAVENOUS

## 2023-11-02 MED ORDER — GENTAMICIN SULFATE 40 MG/ML IJ SOLN
5.0000 mg/kg | Freq: Once | INTRAVENOUS | Status: AC
  Administered 2023-11-02: 390 mg via INTRAVENOUS
  Filled 2023-11-02: qty 9.75

## 2023-11-02 MED ORDER — DEXMEDETOMIDINE HCL IN NACL 80 MCG/20ML IV SOLN
INTRAVENOUS | Status: DC | PRN
Start: 2023-11-02 — End: 2023-11-02
  Administered 2023-11-02 (×3): 8 ug via INTRAVENOUS

## 2023-11-02 MED ORDER — DEXAMETHASONE SODIUM PHOSPHATE 10 MG/ML IJ SOLN
INTRAMUSCULAR | Status: AC
  Filled 2023-11-02: qty 1

## 2023-11-02 MED ORDER — DIPHENHYDRAMINE HCL 25 MG PO CAPS
25.0000 mg | ORAL_CAPSULE | ORAL | Status: DC | PRN
  Administered 2023-11-03: 25 mg via ORAL
  Filled 2023-11-02: qty 1

## 2023-11-02 MED ORDER — MORPHINE SULFATE (PF) 0.5 MG/ML IJ SOLN
INTRAMUSCULAR | Status: DC | PRN
  Administered 2023-11-02: 150 ug via INTRATHECAL

## 2023-11-02 MED ORDER — DIBUCAINE (PERIANAL) 1 % EX OINT
1.0000 | TOPICAL_OINTMENT | CUTANEOUS | Status: DC | PRN
Start: 2023-11-02 — End: 2023-11-05

## 2023-11-02 MED ORDER — SCOPOLAMINE 1 MG/3DAYS TD PT72
1.0000 | MEDICATED_PATCH | Freq: Once | TRANSDERMAL | Status: AC
  Administered 2023-11-02: 1.5 mg via TRANSDERMAL

## 2023-11-02 MED ORDER — CEFAZOLIN SODIUM-DEXTROSE 2-4 GM/100ML-% IV SOLN
INTRAVENOUS | Status: AC
  Filled 2023-11-02: qty 100

## 2023-11-02 MED ORDER — PHENYLEPHRINE HCL (PRESSORS) 10 MG/ML IV SOLN
INTRAVENOUS | Status: DC | PRN
  Administered 2023-11-02: 80 ug via INTRAVENOUS

## 2023-11-02 MED ORDER — PHENYLEPHRINE HCL-NACL 20-0.9 MG/250ML-% IV SOLN
INTRAVENOUS | Status: DC | PRN
  Administered 2023-11-02: 60 ug/min via INTRAVENOUS

## 2023-11-02 MED ORDER — PHENYLEPHRINE 80 MCG/ML (10ML) SYRINGE FOR IV PUSH (FOR BLOOD PRESSURE SUPPORT)
PREFILLED_SYRINGE | INTRAVENOUS | Status: AC
  Filled 2023-11-02: qty 10

## 2023-11-02 MED ORDER — OXYTOCIN-SODIUM CHLORIDE 30-0.9 UT/500ML-% IV SOLN
2.5000 [IU]/h | INTRAVENOUS | Status: AC
  Filled 2023-11-02: qty 500

## 2023-11-02 MED ORDER — PRENATAL MULTIVITAMIN CH
1.0000 | ORAL_TABLET | Freq: Every day | ORAL | Status: DC
  Administered 2023-11-03 – 2023-11-05 (×3): 1 via ORAL
  Filled 2023-11-02 (×3): qty 1

## 2023-11-02 MED ORDER — LACTATED RINGERS IV SOLN: INTRAVENOUS | Status: DC

## 2023-11-02 MED ORDER — WITCH HAZEL-GLYCERIN EX PADS: 1.0000 | MEDICATED_PAD | CUTANEOUS | Status: DC | PRN

## 2023-11-02 MED ORDER — OXYCODONE HCL 5 MG PO TABS
5.0000 mg | ORAL_TABLET | ORAL | Status: DC | PRN
  Administered 2023-11-03 – 2023-11-04 (×4): 5 mg via ORAL
  Administered 2023-11-04 – 2023-11-05 (×5): 10 mg via ORAL
  Filled 2023-11-02 (×2): qty 2
  Filled 2023-11-02 (×2): qty 1
  Filled 2023-11-02 (×3): qty 2
  Filled 2023-11-02 (×2): qty 1

## 2023-11-02 MED ORDER — OXYTOCIN-SODIUM CHLORIDE 30-0.9 UT/500ML-% IV SOLN
INTRAVENOUS | Status: DC | PRN
  Administered 2023-11-02: 300 mL via INTRAVENOUS

## 2023-11-02 MED ORDER — SENNOSIDES-DOCUSATE SODIUM 8.6-50 MG PO TABS
2.0000 | ORAL_TABLET | Freq: Every day | ORAL | Status: DC
  Administered 2023-11-03 – 2023-11-05 (×3): 2 via ORAL
  Filled 2023-11-02 (×3): qty 2

## 2023-11-02 MED ORDER — SOD CITRATE-CITRIC ACID 500-334 MG/5ML PO SOLN
30.0000 mL | ORAL | Status: AC
  Administered 2023-11-02: 30 mL via ORAL

## 2023-11-02 MED ORDER — COCONUT OIL OIL
1.0000 | TOPICAL_OIL | Status: DC | PRN
Start: 2023-11-02 — End: 2023-11-05
  Administered 2023-11-03: 1 via TOPICAL

## 2023-11-02 MED ORDER — SCOPOLAMINE 1 MG/3DAYS TD PT72
MEDICATED_PATCH | TRANSDERMAL | Status: AC
  Filled 2023-11-02: qty 1

## 2023-11-02 MED ORDER — NALOXONE HCL 0.4 MG/ML IJ SOLN: 0.4000 mg | INTRAMUSCULAR | Status: DC | PRN

## 2023-11-02 MED ORDER — ACETAMINOPHEN 500 MG PO TABS
1000.0000 mg | ORAL_TABLET | Freq: Four times a day (QID) | ORAL | Status: DC
Start: 2023-11-02 — End: 2023-11-02

## 2023-11-02 MED ORDER — ACETAMINOPHEN 10 MG/ML IV SOLN
INTRAVENOUS | Status: DC | PRN
  Administered 2023-11-02: 1000 mg via INTRAVENOUS

## 2023-11-02 MED ORDER — ACETAMINOPHEN 10 MG/ML IV SOLN
INTRAVENOUS | Status: AC
  Filled 2023-11-02: qty 100

## 2023-11-02 MED ORDER — DIPHENHYDRAMINE HCL 25 MG PO CAPS: 25.0000 mg | ORAL_CAPSULE | Freq: Four times a day (QID) | ORAL | Status: DC | PRN

## 2023-11-02 MED ORDER — ACETAMINOPHEN 500 MG PO TABS
1000.0000 mg | ORAL_TABLET | Freq: Four times a day (QID) | ORAL | Status: DC
  Administered 2023-11-02 – 2023-11-05 (×11): 1000 mg via ORAL
  Filled 2023-11-02 (×11): qty 2

## 2023-11-02 MED ORDER — FENTANYL CITRATE (PF) 100 MCG/2ML IJ SOLN
INTRAMUSCULAR | Status: DC | PRN
  Administered 2023-11-02: 50 ug via INTRAVENOUS
  Administered 2023-11-02: 35 ug via INTRAVENOUS

## 2023-11-02 MED ORDER — NALOXONE HCL 4 MG/10ML IJ SOLN: 1.0000 ug/kg/h | INTRAVENOUS | Status: DC | PRN

## 2023-11-02 MED ORDER — LORATADINE 10 MG PO TABS
10.0000 mg | ORAL_TABLET | Freq: Every day | ORAL | Status: DC
  Administered 2023-11-02 – 2023-11-05 (×4): 10 mg via ORAL
  Filled 2023-11-02 (×4): qty 1

## 2023-11-02 MED ORDER — ONDANSETRON HCL 4 MG/2ML IJ SOLN
INTRAMUSCULAR | Status: DC | PRN
  Administered 2023-11-02: 4 mg via INTRAVENOUS

## 2023-11-02 MED ORDER — MENTHOL 3 MG MT LOZG: 1.0000 | LOZENGE | OROMUCOSAL | Status: DC | PRN

## 2023-11-02 MED ORDER — SODIUM CHLORIDE 0.9% FLUSH: 3.0000 mL | INTRAVENOUS | Status: DC | PRN

## 2023-11-02 MED ORDER — KETOROLAC TROMETHAMINE 30 MG/ML IJ SOLN
30.0000 mg | Freq: Four times a day (QID) | INTRAMUSCULAR | Status: AC
  Administered 2023-11-02 – 2023-11-03 (×3): 30 mg via INTRAVENOUS
  Filled 2023-11-02 (×3): qty 1

## 2023-11-02 MED ORDER — MEPERIDINE HCL 25 MG/ML IJ SOLN: 6.2500 mg | INTRAMUSCULAR | Status: DC | PRN

## 2023-11-02 MED ORDER — KETOROLAC TROMETHAMINE 30 MG/ML IJ SOLN: 30.0000 mg | Freq: Four times a day (QID) | INTRAMUSCULAR | Status: DC | PRN

## 2023-11-02 MED ORDER — SIMETHICONE 80 MG PO CHEW
80.0000 mg | CHEWABLE_TABLET | ORAL | Status: DC | PRN
  Administered 2023-11-03: 80 mg via ORAL
  Filled 2023-11-02: qty 1

## 2023-11-02 MED ORDER — FENTANYL CITRATE (PF) 100 MCG/2ML IJ SOLN
INTRAMUSCULAR | Status: AC
  Filled 2023-11-02: qty 2

## 2023-11-02 MED ORDER — SOD CITRATE-CITRIC ACID 500-334 MG/5ML PO SOLN
ORAL | Status: AC
  Filled 2023-11-02: qty 30

## 2023-11-02 MED ORDER — ONDANSETRON HCL 4 MG/2ML IJ SOLN: 4.0000 mg | Freq: Three times a day (TID) | INTRAMUSCULAR | Status: DC | PRN

## 2023-11-02 MED ORDER — ONDANSETRON HCL 4 MG/2ML IJ SOLN
INTRAMUSCULAR | Status: AC
  Filled 2023-11-02: qty 2

## 2023-11-02 MED ORDER — OXYTOCIN-SODIUM CHLORIDE 30-0.9 UT/500ML-% IV SOLN
INTRAVENOUS | Status: AC
  Filled 2023-11-02: qty 500

## 2023-11-02 MED ORDER — IBUPROFEN 600 MG PO TABS
600.0000 mg | ORAL_TABLET | Freq: Four times a day (QID) | ORAL | Status: DC
  Administered 2023-11-03 – 2023-11-05 (×8): 600 mg via ORAL
  Filled 2023-11-02 (×8): qty 1

## 2023-11-02 MED ORDER — FENTANYL CITRATE (PF) 100 MCG/2ML IJ SOLN
INTRAMUSCULAR | Status: DC | PRN
  Administered 2023-11-02: 15 ug via INTRATHECAL

## 2023-11-02 MED ORDER — DEXAMETHASONE SODIUM PHOSPHATE 10 MG/ML IJ SOLN
INTRAMUSCULAR | Status: DC | PRN
  Administered 2023-11-02: 10 mg via INTRAVENOUS

## 2023-11-02 MED ORDER — KETOROLAC TROMETHAMINE 30 MG/ML IJ SOLN
INTRAMUSCULAR | Status: AC
  Filled 2023-11-02: qty 1

## 2023-11-02 MED ORDER — CLINDAMYCIN PHOSPHATE 900 MG/50ML IV SOLN
INTRAVENOUS | Status: AC
Start: 2023-11-02 — End: ?
  Filled 2023-11-02: qty 50

## 2023-11-02 MED ORDER — KETOROLAC TROMETHAMINE 30 MG/ML IJ SOLN
30.0000 mg | Freq: Four times a day (QID) | INTRAMUSCULAR | Status: DC | PRN
Start: 2023-11-02 — End: 2023-11-02
  Administered 2023-11-02: 30 mg via INTRAVENOUS

## 2023-11-02 MED ORDER — BUPIVACAINE IN DEXTROSE 0.75-8.25 % IT SOLN
INTRATHECAL | Status: DC | PRN
  Administered 2023-11-02: 1.6 mL via INTRATHECAL

## 2023-11-02 MED ORDER — FENTANYL CITRATE (PF) 100 MCG/2ML IJ SOLN: 25.0000 ug | INTRAMUSCULAR | Status: DC | PRN

## 2023-11-02 MED ORDER — SIMETHICONE 80 MG PO CHEW
80.0000 mg | CHEWABLE_TABLET | Freq: Three times a day (TID) | ORAL | Status: DC
  Administered 2023-11-03 – 2023-11-05 (×7): 80 mg via ORAL
  Filled 2023-11-02 (×7): qty 1

## 2023-11-02 MED ORDER — MORPHINE SULFATE (PF) 0.5 MG/ML IJ SOLN
INTRAMUSCULAR | Status: AC
  Filled 2023-11-02: qty 10

## 2023-11-02 MED ORDER — TRANEXAMIC ACID-NACL 1000-0.7 MG/100ML-% IV SOLN
INTRAVENOUS | Status: DC | PRN
  Administered 2023-11-02: 1000 mg via INTRAVENOUS

## 2023-11-02 SURGICAL SUPPLY — 36 items
BENZOIN TINCTURE PRP APPL 2/3 (GAUZE/BANDAGES/DRESSINGS) IMPLANT
CHLORAPREP W/TINT 26 (MISCELLANEOUS) ×2 IMPLANT
CLAMP UMBILICAL CORD (MISCELLANEOUS) ×1 IMPLANT
CLOTH BEACON ORANGE TIMEOUT ST (SAFETY) ×1 IMPLANT
DERMABOND ADVANCED .7 DNX12 (GAUZE/BANDAGES/DRESSINGS) IMPLANT
DRAPE C SECTION CLR SCREEN (DRAPES) ×1 IMPLANT
DRSG OPSITE POSTOP 4X10 (GAUZE/BANDAGES/DRESSINGS) ×1 IMPLANT
ELECTRODE REM PT RTRN 9FT ADLT (ELECTROSURGICAL) ×1 IMPLANT
EXTRACTOR VACUUM KIWI (MISCELLANEOUS) ×1 IMPLANT
GLOVE BIOGEL PI IND STRL 7.0 (GLOVE) ×2 IMPLANT
GLOVE SURG SS PI 6.5 STRL IVOR (GLOVE) ×1 IMPLANT
GOWN STRL REUS W/TWL LRG LVL3 (GOWN DISPOSABLE) ×2 IMPLANT
KIT ABG SYR 3ML LUER SLIP (SYRINGE) IMPLANT
MAT PREVALON FULL STRYKER (MISCELLANEOUS) IMPLANT
NDL HYPO 25X5/8 SAFETYGLIDE (NEEDLE) IMPLANT
NDL KEITH (NEEDLE) ×1 IMPLANT
NEEDLE HYPO 25X5/8 SAFETYGLIDE (NEEDLE) IMPLANT
NEEDLE KEITH (NEEDLE) ×1 IMPLANT
NS IRRIG 1000ML POUR BTL (IV SOLUTION) ×1 IMPLANT
PACK C SECTION WH (CUSTOM PROCEDURE TRAY) ×1 IMPLANT
PAD OB MATERNITY 4.3X12.25 (PERSONAL CARE ITEMS) ×1 IMPLANT
RTRCTR C-SECT PINK 25CM LRG (MISCELLANEOUS) ×1 IMPLANT
STRIP CLOSURE SKIN 1/2X4 (GAUZE/BANDAGES/DRESSINGS) IMPLANT
SUT CHROMIC 1 CTX 36 (SUTURE) IMPLANT
SUT CHROMIC 2 0 CT 1 (SUTURE) ×1 IMPLANT
SUT PLAIN 1 NONE 54 (SUTURE) IMPLANT
SUT PLAIN 2 0 XLH (SUTURE) IMPLANT
SUT PLAIN ABS 2-0 CT1 27XMFL (SUTURE) IMPLANT
SUT VIC AB 0 CT1 36 (SUTURE) IMPLANT
SUT VIC AB 0 CTX36XBRD ANBCTRL (SUTURE) ×1 IMPLANT
SUT VIC AB 1 CTX36XBRD ANBCTRL (SUTURE) ×2 IMPLANT
SUT VIC AB 4-0 KS 27 (SUTURE) IMPLANT
SUTURE PLAIN GUT 2.0 ETHICON (SUTURE) IMPLANT
TOWEL OR 17X24 6PK STRL BLUE (TOWEL DISPOSABLE) ×1 IMPLANT
TRAY FOLEY W/BAG SLVR 14FR LF (SET/KITS/TRAYS/PACK) ×1 IMPLANT
WATER STERILE IRR 1000ML POUR (IV SOLUTION) ×1 IMPLANT

## 2023-11-02 NOTE — Anesthesia Postprocedure Evaluation (Signed)
 Anesthesia Post Note  Patient: Elizabeth [name]  Procedure(s) Performed: CESAREAN DELIVERY     Patient location during evaluation: PACU Anesthesia Type: Spinal Level of consciousness: oriented and awake and alert Pain management: pain level controlled Vital Signs Assessment: post-procedure vital signs reviewed and stable Respiratory status: spontaneous breathing, respiratory function stable and patient connected to nasal cannula oxygen Cardiovascular status: blood pressure returned to baseline and stable Postop Assessment: no headache, no backache, no apparent nausea or vomiting, spinal receding and patient able to bend at knees Anesthetic complications: no   No notable events documented.  Last Vitals:  Vitals:   11/02/23 1758 11/02/23 1800  BP: 107/60   Pulse: 78 72  Resp: 19 (!) 21  Temp:  36.6 C  SpO2: 99% 98%    Last Pain:  Vitals:   11/02/23 1800  TempSrc: Oral  PainSc: 0-No pain   Pain Goal:    LLE Motor Response: Purposeful movement (11/02/23 1800)   RLE Motor Response: Purposeful movement (11/02/23 1800)       Epidural/Spinal Function Cutaneous sensation: Able to Wiggle Toes (11/02/23 1800), Patient able to flex knees: Yes (11/02/23 1800), Patient able to lift hips off bed: No (11/02/23 1800), Back pain beyond tenderness at insertion site: No (11/02/23 1800), Progressively worsening motor and/or sensory loss: No (11/02/23 1800), Bowel and/or bladder incontinence post epidural: No (11/02/23 1800)  Trentan Trippe A.

## 2023-11-02 NOTE — Anesthesia Procedure Notes (Signed)
 Spinal  Patient location during procedure: OR Start time: 11/02/2023 3:13 PM End time: 11/02/2023 3:23 PM Reason for block: surgical anesthesia Staffing Performed: anesthesiologist  Anesthesiologist: Peggy Bowens, MD Performed by: Peggy Bowens, MD Authorized by: Peggy Bowens, MD   Preanesthetic Checklist Completed: patient identified, IV checked, site marked, risks and benefits discussed, surgical consent, monitors and equipment checked, pre-op evaluation and timeout performed Spinal Block Patient position: sitting Prep: DuraPrep Patient monitoring: heart rate, cardiac monitor, continuous pulse ox and blood pressure Approach: midline Location: L3-4 Injection technique: single-shot Needle Needle type: Pencan  Needle gauge: 24 G Needle length: 9 cm Assessment Sensory level: T4 Events: CSF return

## 2023-11-02 NOTE — Op Note (Addendum)
 Patient: Elizabeth [name] DOB: 1990/08/09 MRN:  [id number]  DATE OF SURGERY:  11/02/2023  PREOP DIAGNOSIS:  1. 39 week 1 day EGA IUP. 2.  History of 1 prior cesarean section and desiring a repeat cesarean delivery, she declines trial of labor and vaginal birth after a cesarean section. 3. Multiparous patient desiring permanent sterilization. 4. Maternal BMI of 42.53 kg/m2.    POSTOP DIAGNOSIS: Same as above.     PROCEDURE:  1. Repeat low uterine segment transverse cesarean section via Pfannenstiel incision with Vacuum assistance.  2. Post partum sterilization via bilateral complete salpingectomy.   SURGEON: Dr.  Vernal Gold.   ASSISTANT: Melodee Spruce, CNM.   SURGEON ATTESTATION: I was present and scrubbed for the entire case.  An experienced assistant was required given the standard of surgical care given the complexity of the case.  This assistant was needed for exposure, dissection, suctioning, retraction, instrument exchange,  assisting with delivery with administration of fundal pressure, and for overall help during the surgery.    ANESTHESIA: Spinal  COMPLICATIONS: None  FINDINGS: Viable female infant in cephalic presentation, compound presentation with hand and cord presentation.  ROT, weight pending, Apgar scores of 8 and 9. Normal uterus and bilateral fallopian tubes.  Normal left and right ovary.    EBL:   410 cc  IV FLUID:  900 cc LR   URINE OUTPUT: 200 cc clear urine  INDICATIONS:  33 y/o P1 who presented for a repeat cesarean section and sterilization. She was consented for the procedures after explaining risks benefits and alternatives of the procedures.    PROCEDURE:   She was taken to the operating room where her spinal anesthesia was found to be adequate. She was prepped and draped in the usual sterile fashion and a Foley catheter was placed. She received gentamicin  and clindamycin  preoperatively. A Pfannenstiel incision was made with the scalpel over the prior  incision and the incision extended through the subcutaneous layer and also the fascia with the bovie. Small perforators in the subcutaneous layer were contained with the Bovie. The fascia was nicked in the midline and then was further separated from the rectus muscles bilaterally using Mayo scissors. Kochers were placed inferiorly and then superiorly to allow further separation of fascia from the rectus muscles.  The peritoneal cavity was entered bluntly with the fingers. The Alexis retractor was placed in. The utero vesical fold was dissected down using Metzenbaum scissors.   The uterus was incised with a scalpel and the incision extended bluntly bilaterally with fingers and bandage scissors.  Membranes were ruptured and moderate clear amniotic fluid was noted.  Compound presentation was noted. The hand and cord were reduced and delivery of head attempted unsuccessfully. Kiwi vacuum was then applied to fetal head and delivery was effected on the second pull to the green zone and with fundal pressure applied by the assistant.  Then the rest of the body was then delivered with abdominal pressure.  She delivered a viable female infant, apgar scores 8, 9.  The edges of the uterus was grasped with T clamps.  The umbilical cord was clamped and cut after 1 minute. Cord blood was collected.  The uterus was not exteriorized.  The placenta was delivered with gentle traction on the umbilical cord.  The uterus was cleared of clots and debris with a lap.  The uterine incision was closed with #1 Vicryl in a running locked stitch. Small bleeding on the right side of the incision was contained with an  imbricating stitch with # 1 vicryl.  Irrigation was applied and suctioned out. Excellent hemostasis was noted over the incision.  The right  fallopian tube was identified and followed up to the fimbria end.  The small ligasure jaw open was used to seal and transect the tube from fimbria end to cornua end.  A similar procedure was  done to remove the left fallopian tube.  Both uterine cornua and remaining mesosalpinx were hemostatic.    The uterine incision was re inspected and noted to be hemostatic.    The muscles and peritoneum were then reapproximated using 2-0 chromic suture.  Fascia was closed using 0 Vicryl in a running stitch. The subcutaneous layer was irrigated and suctioned out. Small perforators were contained with the bovie.  The subcutaneous layer was closed using 1-0 plain in interrupted stitches. The skin was closed using 4-0 Vicryl on the keith needle.  Benzocaine and steri strips were applied.  Honeycomb was then applied. The patient was then cleaned and she was taken to the recovery room with her baby in stable conditions.   SPECIMEN: Placenta to labor and delivery, umbilical cord blood to lab. Left and right fallopian tubes to pathology.   DISPOSITION: TO PACU, STABLE.   Dr. Vernal Gold.

## 2023-11-02 NOTE — Anesthesia Preprocedure Evaluation (Signed)
 Anesthesia Evaluation  Patient identified by MRN, date of birth, ID band Patient awake    Reviewed: Allergy & Precautions, NPO status , Patient's Chart, lab work & pertinent test results  Airway Mallampati: II  TM Distance: >3 FB Neck ROM: Full    Dental   Pulmonary asthma , former smoker   breath sounds clear to auscultation       Cardiovascular  Rhythm:Regular Rate:Normal     Neuro/Psych  PSYCHIATRIC DISORDERS      negative neurological ROS     GI/Hepatic negative GI ROS, Neg liver ROS,,,  Endo/Other    Class 3 obesity  Renal/GU negative Renal ROS     Musculoskeletal   Abdominal   Peds  Hematology negative hematology ROS (+)   Anesthesia Other Findings   Reproductive/Obstetrics (+) Pregnancy                             Anesthesia Physical Anesthesia Plan  ASA: 3  Anesthesia Plan: Spinal   Post-op Pain Management:    Induction:   PONV Risk Score and Plan: 2 and Dexamethasone , Ondansetron  and Treatment may vary due to age or medical condition  Airway Management Planned: Natural Airway  Additional Equipment:   Intra-op Plan:   Post-operative Plan:   Informed Consent: I have reviewed the patients History and Physical, chart, labs and discussed the procedure including the risks, benefits and alternatives for the proposed anesthesia with the patient or authorized representative who has indicated his/her understanding and acceptance.       Plan Discussed with:   Anesthesia Plan Comments:        Anesthesia Quick Evaluation

## 2023-11-02 NOTE — Transfer of Care (Signed)
 Immediate Anesthesia Transfer of Care Note  Patient: Elizabeth [name]  Procedure(s) Performed: CESAREAN DELIVERY  Patient Location: PACU  Anesthesia Type:Spinal  Level of Consciousness: awake, alert , and oriented  Airway & Oxygen Therapy: Patient Spontanous Breathing  Post-op Assessment: Report given to RN and Post -op Vital signs reviewed and stable  Post vital signs: Reviewed and stable  Last Vitals:  Vitals Value Taken Time  BP 101/58 11/02/23 1730  Temp    Pulse 71 11/02/23 1733  Resp 22 11/02/23 1733  SpO2 99 % 11/02/23 1733  Vitals shown include unfiled device data.  Last Pain:  Vitals:   11/02/23 1215  TempSrc: Oral         Complications: No notable events documented.

## 2023-11-03 ENCOUNTER — Encounter (HOSPITAL_COMMUNITY): Payer: Self-pay | Admitting: Obstetrics & Gynecology

## 2023-11-03 LAB — CBC
HCT: 32.9 % — ABNORMAL LOW (ref 36.0–46.0)
Hemoglobin: 11.4 g/dL — ABNORMAL LOW (ref 12.0–15.0)
MCH: 31.5 pg (ref 26.0–34.0)
MCHC: 34.7 g/dL (ref 30.0–36.0)
MCV: 90.9 fL (ref 80.0–100.0)
Platelets: 157 10*3/uL (ref 150–400)
RBC: 3.62 MIL/uL — ABNORMAL LOW (ref 3.87–5.11)
RDW: 13.9 % (ref 11.5–15.5)
WBC: 14.4 10*3/uL — ABNORMAL HIGH (ref 4.0–10.5)
nRBC: 0 % (ref 0.0–0.2)

## 2023-11-03 NOTE — Lactation Note (Signed)
 This note was copied from a baby's chart. Lactation Consultation Note  Patient Name: Elizabeth [name] [id number] Date: 11/03/2023 Age:35 hours Reason for consult: Initial assessment;Term.  MOB ask for latch assistance initially she wanted to formula feed infant but decided to try breastfeeding infant. MOB latched infant on her left breast using the football hold position with pillow support, infant was on and off the breast for 12 minutes afterwards MOB supplement infant with 15 mls of formula. MOB will continue to breastfeed infant 1st every feeding by cues, 8+ times within 24 hours, skin to skin and afterwards supplement infant this is her feeding choice. MOB knows to call for further latch assistance if needed. LC discussed importance of maternal rest, meals and hydration. LC sent referral for [facility]. MOB was made aware of O/P services, breastfeeding support groups, community resources, and our phone # for post-discharge questions.    Maternal Data Has patient been taught Hand Expression?: Yes Does the patient have breastfeeding experience prior to this delivery?: Yes How long did the patient breastfeed?: MOB informed LC she breastfeed 1st child for 6 weeks  Feeding Mother's Current Feeding Choice: Breast Milk and Formula  LATCH Score Latch: Repeated attempts needed to sustain latch, nipple held in mouth throughout feeding, stimulation needed to elicit sucking reflex.  Audible Swallowing: A few with stimulation  Type of Nipple: Everted at rest and after stimulation  Comfort (Breast/Nipple): Soft / non-tender  Hold (Positioning): Assistance needed to correctly position infant at breast and maintain latch.  LATCH Score: 7   Lactation Tools Discussed/Used    Interventions Interventions: Breast feeding basics reviewed;Assisted with latch;Skin to skin;Breast compression;Adjust position;Support pillows;Position options;Expressed milk;Education;Pace feeding;LC Services  brochure;Guidelines for Milk Supply and Pumping Schedule Handout;CDC milk storage guidelines;CDC Guidelines for Breast Pump Cleaning  Discharge Pump: Referral sent for [facility]  Consult Status Consult Status: Follow-up Date: 11/03/23 Follow-up type: In-patient    Pecolia Bourbon 11/03/2023, 1:26 AM

## 2023-11-03 NOTE — Discharge Summary (Incomplete)
 Postpartum Discharge Summary  Date of Service updated***     Patient Name: Elizabeth [name] DOB: [birth year] MRN: [id number]  Date of admission: 11/02/2023 Delivery date:11/02/2023 Delivering provider: Vernal Gold Date of discharge: 11/03/2023  Admitting diagnosis: Other specified postprocedural states [Z98.890] Intrauterine pregnancy: [redacted]w[redacted]d     Secondary diagnosis:  Principal Problem:   Other specified postprocedural states Active Problems:   Delivered by cesarean delivery following previous cesarean delivery  Additional problems: none    Discharge diagnosis: Term Pregnancy Delivered                                              Post partum procedures:none Augmentation: N/A Complications: None  Hospital course: Scheduled C/S   33 y.o. yo [id number] at [redacted]w[redacted]d was admitted to the hospital 11/02/2023 for scheduled cesarean section with the following indication:Elective Repeat.Delivery details are as follows:  Membrane Rupture Time/Date: 4:04 PM,11/02/2023  Delivery Method:C-Section, Vacuum Assisted Operative Delivery:Device used:Kiwi vacuum Indication: Fetal indications Details of operation can be found in separate operative note.  Patient had a postpartum course was uncomplicated.  She is ambulating, tolerating a regular diet, passing flatus, and urinating well. Patient is discharged home in stable condition on  11/03/23        Newborn Data: Birth date:11/02/2023 Birth time:4:05 PM Gender:Female Living status:Living Apgars:8 ,9  Weight:3520 g    Magnesium  Sulfate received: No BMZ received: No Rhophylac:N/A MMR:N/A T-DaP:declined Flu: No RSV Vaccine received: No Transfusion:No Immunizations administered: Immunization History  Administered Date(s) Administered   HPV Quadrivalent 07/23/2006, 11/08/2006   Influenza,inj,quad, With Preservative 03/17/2014   PFIZER(Purple Top)SARS-COV-2 Vaccination 09/06/2019, 09/30/2019, 05/27/2020   Pneumococcal Conjugate-13 03/17/2014   Tdap  11/21/2015, 03/04/2019    Physical exam *** Vitals:   11/03/23 0200 11/03/23 0600 11/03/23 1015 11/03/23 1459  BP: 101/68 100/60 (!) 107/56 (!) 95/53  Pulse: 70 72 78 80  Resp: 17 17 16 18   Temp: 98 F (36.7 C) 97.9 F (36.6 C) 99.2 F (37.3 C) 98.5 F (36.9 C)  TempSrc: Axillary Axillary Oral Oral  SpO2:   99% 99%  Weight:      Height:       General: {Exam; general:21111117} Lochia: {Desc; appropriate/inappropriate:30686::"appropriate"} Uterine Fundus: {Desc; firm/soft:30687} Incision: {Exam; incision:21111123} DVT Evaluation: {Exam; dvt:2111122} Labs:*** Lab Results  Component Value Date   WBC 14.4 (H) 11/03/2023   HGB 11.4 (L) 11/03/2023   HCT 32.9 (L) 11/03/2023   MCV 90.9 11/03/2023   PLT 157 11/03/2023      Latest Ref Rng & Units 06/26/2022    4:16 PM  CMP  Glucose 70 - 99 mg/dL 85   BUN 6 - 20 mg/dL 8   Creatinine 1.30 - 8.65 mg/dL 7.84   Sodium 696 - 295 mmol/L 136   Potassium 3.5 - 5.1 mmol/L 2.9   Chloride 98 - 111 mmol/L 108   CO2 22 - 32 mmol/L 19   Calcium 8.9 - 10.3 mg/dL 9.2   Total Protein 6.5 - 8.1 g/dL 7.5   Total Bilirubin 0.3 - 1.2 mg/dL 0.5   Alkaline Phos 38 - 126 U/L 53   AST 15 - 41 U/L 21   ALT 0 - 44 U/L 14    Edinburgh Score:***    11/03/2023   10:15 AM  Edinburgh Postnatal Depression Scale Screening Tool  I have been able to laugh and see the  funny side of things. --      After visit meds: *** Allergies as of 11/03/2023       Reactions   Depakote  [divalproex  Sodium] Anaphylaxis   Penicillins Anaphylaxis, Other (See Comments)   Has patient had a PCN reaction causing immediate rash, facial/tongue/throat swelling, SOB or lightheadedness with hypotension: Yes Has patient had a PCN reaction causing severe rash involving mucus membranes or skin necrosis: No Has patient had a PCN reaction that required hospitalization No Has patient had a PCN reaction occurring within the last 10 years: No If all of the above answers are "NO",  then may proceed with Cephalosporin use.   Risperidone  And Related Other (See Comments)   Weight gain   Trazodone  And Nefazodone Other (See Comments)   Pt states that she does not like how this medication makes her feel.    Vistaril  [hydroxyzine  Hcl] Itching   Peanut Butter Flavoring Agent (non-screening) Itching, Rash   Reaction to peanut butter only, tolerates peanuts.     Med Rec must be completed prior to using this [facility]***        Discharge home in stable condition Infant Feeding: {Baby feeding:23562} Infant Disposition:{CHL IP OB HOME WITH ZOXWRU:04540} Discharge instruction: per After Visit Summary and Postpartum booklet. Activity: Advance as tolerated. Pelvic rest for 6 weeks.  Diet: {OB diet:21111121} Anticipated Birth Control: {Birth Control:23956} Postpartum Appointment:{Outpatient follow up:23559} Additional Postpartum F/U: {PP Procedure:23957} Future Appointments:No future appointments. Follow up Visit:  Follow-up Information     Vernal Gold, MD. Schedule an appointment as soon as possible for a visit in 6 week(s).   Specialty: Obstetrics and Gynecology Contact information: [address] [place] [address] [phone]                     11/03/2023 Anice Kerbs, CNM

## 2023-11-03 NOTE — Progress Notes (Signed)
 CSW attempted to meet with MOB to complete psychosocial assessment due to history of Bipolar Disorder and anxiety; however, when CSW entered, MOB was preparing to feed infant with the curtain drawn. CSW introduced self and explained reason for visit. CSW offered to return at a later time. MOB expressed appreciation. CSW offered to contact lactation for assistance, MOB declined stating lactation had recently visited with her. CSW to follow up.   Signed,  Elizabeth Gulling, MSW, [place], [place] 2024/06/01 4:38 PM

## 2023-11-03 NOTE — Lactation Note (Signed)
 This note was copied from a baby's chart. Lactation Consultation Note  Patient Name: Boy Leyah Bocchino ZOXWR'U Date: 11/03/2023 Age:35 hours, P2 @ 16 hours and 19 mins Serum Bili - 9.2,  Reason for consult: Follow-up assessment;1st time breastfeeding;Term;Infant weight loss;Breastfeeding assistance Baby placed on double photo treatment this am.  As LC entered the room, mom mentioned she had attempted to latch at 1:03 pm , and the supplemented and took 2 ml.  While LC was talking to mom, baby spit up moderate undigested formula, LC suctioned with bulb syringe. Large wet and Moderate loose mec changed. After the spitting settled down. LC offered to assist to latch and mom receptive. LC set up the photo tx blanket on top of the 2 pillows for football position on the right breast.. With assistance baby latched on the right breast 10 mins with swallow. LC assisted to work on depth. Latch score - 8. LC noted areola edema and provided coconut oil and set up a DEBP for post pumping both breast after feedings . ( See below ).  LC recommended since the baby is on  double photo and the Serum Bilirubin is high for age, Offer the breast with feeding cues or by 3 hours 15 -20 mins , then supplement EBM or formula. Post pump both breast for 15 mins and save the milk for the next feeding.    Maternal Data Has patient been taught Hand Expression?: Yes Does the patient have breastfeeding experience prior to this delivery?: Yes How long did the patient breastfeed?: 1 month and 1/2  Feeding Mother's Current Feeding Choice: Breast Milk and Formula Nipple Type: Slow - flow  LATCH Score Latch: Repeated attempts needed to sustain latch, nipple held in mouth throughout feeding, stimulation needed to elicit sucking reflex.  Audible Swallowing: Spontaneous and intermittent  Type of Nipple: Everted at rest and after stimulation (some areola edema)  Comfort (Breast/Nipple): Soft / non-tender  Hold (Positioning):  Assistance needed to correctly position infant at breast and maintain latch.  LATCH Score: 8   Lactation Tools Discussed/Used  DEBP, #54F and #21 F, coconut oil   Interventions Interventions: Breast feeding basics reviewed;Assisted with latch;Skin to skin;Breast massage;Hand express;Pre-pump if needed;Reverse pressure;Breast compression;Adjust position;Support pillows;Position options;Hand pump;DEBP;Education;LC Services brochure;CDC milk storage guidelines;CDC Guidelines for Breast Pump Cleaning  Discharge Pump: Referral sent for [facility] Pump  Consult Status Consult Status: Follow-up Date: 11/04/23 Follow-up type: In-patient    Renda Carpen Amirra Herling 11/03/2023, 2:35 PM

## 2023-11-03 NOTE — Progress Notes (Signed)
 Subjective: POD# 1 Information for the patient's newborn:  Anea, Fodera [[id number]]  female   Circumcision yes  Reports feeling good Feeding: breast and formula Reports tolerating PO and denies N/V, foley removed, ambulating and urinating w/o difficulty  Pain controlled with PO meds Denies HA/SOB/dizziness  Flatus not passing Vaginal bleeding is normal, no clots     Objective:  VS:  Vitals:   11/02/23 1838 11/02/23 1945 11/02/23 2056 11/02/23 2204  BP: (!) 90/54 (!) 102/50 (!) 95/48 (!) 102/56  Pulse: 73 77 67 71  Resp: 18 17 17 17   Temp: 98.2 F (36.8 C) 98.8 F (37.1 C) 98 F (36.7 C) 99 F (37.2 C)  TempSrc: Oral Oral Oral Oral  SpO2: 99% 99%    Weight:      Height:        Intake/Output Summary (Last 24 hours) at 11/03/2023 0055 Last data filed at 11/02/2023 1813 Gross per 24 hour  Intake 1909.75 ml  Output 760 ml  Net 1149.75 ml     Recent Labs    11/01/23 0912 11/03/23 0446  WBC 7.0 14.4*  HGB 13.3 11.4*  HCT 38.8 32.9*  PLT 174 157    Blood type: --/--/O POS (05/29 4098) Rubella: Immune (11/20 0000)    Physical Exam:  General: alert, cooperative, and no distress CV: Regular rate and rhythm Resp: clear Abdomen: soft, nontender, normal bowel sounds Incision: clean, dry, and intact Perineum:  Uterine Fundus: firm, below umbilicus, nontender Lochia: minimal Ext: trace edema, negative for tenderness, pain, and cords   Assessment/Plan: 33 y.o.   POD# 1. [id number]                  Principal Problem:   Other specified postprocedural states Active Problems:   Delivered by cesarean delivery following previous cesarean delivery   Routine post-op PP care          Advance diet as tolerated Advised warm fluids and ambulation to improve GI motility Lactation support PRN Contraception: sterilization Anticipate D/C POD #2  Anice Kerbs, DNP, CNM 11/03/2023, 12:55 AM

## 2023-11-04 MED ORDER — OXYCODONE HCL 5 MG PO TABS: 5.0000 mg | ORAL_TABLET | Freq: Four times a day (QID) | ORAL | 0 refills | Status: AC | PRN

## 2023-11-04 MED ORDER — IBUPROFEN 600 MG PO TABS: 600.0000 mg | ORAL_TABLET | Freq: Four times a day (QID) | ORAL | 0 refills | Status: AC

## 2023-11-04 MED ORDER — ACETAMINOPHEN 500 MG PO TABS: 1000.0000 mg | ORAL_TABLET | Freq: Four times a day (QID) | ORAL | 0 refills | Status: AC

## 2023-11-04 NOTE — Lactation Note (Signed)
 This note was copied from a baby's chart. Lactation Consultation Note  Patient Name: Elizabeth [name] [id number] Date: 11/04/2023 Age:35 hours  Reason for consult: Follow-up assessment;Hyperbilirubinemia;Term  P2, [redacted]w[redacted]d, jaundice with triple phototherapy  Follow up LC visit with mother and baby. Mother states she has been formula feeding due phototherapy. She says she has pumped and did not express milk. Mother reports baby was interested in latching prior to phototherapy but she has concerns because her nipples are flat and slightly inverted that he will not be able to latch again. Mother does not want to take baby out of phototherapy to work on breastfeeding. Reassured mother that she can work on latching baby after lights have been discontinued and the main focus can be on stimulating her milk production and feeding her milk to baby by bottle.   Mother was given inverted breast shells to wear to assist with nipple protrusion. Instructed not to sleep on her stomach when wearing. Discussed pumping will also be helpful.   Discussed pumping and encouraged mother to pump every 3 hours to stimulate her milk production. Instructed to feed baby her expressed milk first, even if a few drops, followed by formula.   Mother was also informed of OP Lactation support with breastfeeding following discharge. Mother aware of [facility] OP LC and she will call if she desires. Mother inquired about the stork pump and will have LC tomorrow follow up.    Feeding Mother's Current Feeding Choice: Formula Nipple Type: Slow - flow    Lactation Tools Discussed/Used Pumping frequency: encourged to pump every 3 hours for 15 min in the initiation setting Pumped volume:  (has not collected milk yet)  Interventions Interventions: Education;Shells;LC Services brochure      Consult Status Consult Status: Follow-up Date: 11/05/23 Follow-up type: In-patient    Gearline Kell M 11/04/2023, 6:59  PM

## 2023-11-04 NOTE — Progress Notes (Signed)
 Subjective: POD# 2 Information for the patient's newborn:  Elizabeth, [name] [[id number]]  female   Baby's Name Elizabeth [name] Circumcision Yes  Reports feeling anxious and concerned for son D/T jaundice. Son is at bedside and under phototherapy  Feeding: breast and formula Reports tolerating PO and denies N/V, foley removed, ambulating and urinating w/o difficulty  Pain controlled with PO meds Denies HA/SOB/dizziness  Flatus not passing, advised warm fluids, and ambulation Vaginal bleeding is normal, no clots     Objective:  VS:  Vitals:   11/03/23 1015 11/03/23 1459 11/03/23 2102 11/04/23 0438  BP: (!) 107/56 (!) 95/53 (!) 106/52 112/69  Pulse: 78 80 74 75  Resp: 16 18 16 18   Temp: 99.2 F (37.3 C) 98.5 F (36.9 C) 98.8 F (37.1 C) 98.4 F (36.9 C)  TempSrc: Oral Oral Oral Oral  SpO2: 99% 99% 99% 100%  Weight:      Height:        Intake/Output Summary (Last 24 hours) at 11/04/2023 0613 Last data filed at 11/03/2023 1800 Gross per 24 hour  Intake --  Output 1550 ml  Net -1550 ml     Recent Labs    11/01/23 0912 11/03/23 0446  WBC 7.0 14.4*  HGB 13.3 11.4*  HCT 38.8 32.9*  PLT 174 157    Blood type: --/--/O POS (05/29 4098) Rubella: Immune (11/20 0000)    Physical Exam:  General: alert, cooperative, and no distress CV: Regular rate and rhythm Resp: clear Abdomen: soft, nontender, normal bowel sounds Incision: clean, dry, intact, and new honeycomb applied 11/03/23 Perineum:  Uterine Fundus: firm, below umbilicus, nontender Lochia: minimal Ext: no edema, negative for tenderness, pain, and cords   Assessment/Plan: 33 y.o.   POD# 2. [id number]                  Principal Problem:   Other specified postprocedural states Active Problems:   Delivered by cesarean delivery following previous cesarean delivery   Continue routine post-op PP care          Advised warm fluids and ambulation to improve GI motility Lactation support PRN Contraception:  sterilization Anticipate D/C POD #3  Anice Kerbs, DNP, CNM 11/04/2023, 6:13 AM

## 2023-11-04 NOTE — Clinical Social Work Maternal (Signed)
 CLINICAL SOCIAL WORK MATERNAL/CHILD NOTE  Patient Details  Name: Elizabeth [name] MRN: [id number] Date of Birth: 02/02/1991  Date:  11/04/2023  Clinical Social Worker Initiating Note:  Eliazar Gross, LCSWA Date/Time: Initiated:  11/04/23/1453     Child's Name:  Elizabeth [name]   Biological Parents:  Mother   Need for Interpreter:      Reason for Referral:  Behavioral Health Concerns   Address:  [address] [place] [address]    Phone number:  [phone] (home)     Additional phone number:   Household Members/Support Persons (HM/SP):   Household Member/Support Person 1   HM/SP Name Relationship DOB or Age  HM/SP -1 Rashidah [place] 06/04/2019  HM/SP -2        HM/SP -3        HM/SP -4        HM/SP -5        HM/SP -6        HM/SP -7        HM/SP -8          Natural Supports (not living in the home):  Parent   Professional Supports: Other (Comment) (Resources through employer (therapy, medical care))   Employment: Full-time   Type of Work: "CSR"   Education:  Some Materials engineer arranged:    Surveyor, quantity Resources:  [employer], Media planner    Other Resources:  Sales executive     Cultural/Religious Considerations Which May Impact Care:  Per Technical brewer, MOB identifies as Control and instrumentation engineer.  Strengths:  Ability to meet basic needs  , Home prepared for child  , Pediatrician chosen   Psychotropic Medications:         Pediatrician:    Armed forces operational officer area  Pediatrician List:   [facility]  [place]    [place]      Pediatrician Fax Number:    Risk Factors/Current Problems:  Mental Health Concerns     Cognitive State:  Linear Thinking  , Able to Concentrate  , Alert  , Goal Oriented     Mood/Affect:  Euthymic  , Comfortable  , Relaxed  , Interested     CSW Assessment: CSW was consulted due to history of Bipolar Disorder and Anxiety. CSW met with MOB  at bedside to complete assessment and provide support. When CSW entered room, MOB was observed sitting in hospital bed. Infant was laying in bassinet under bili light. MOB's mother and 35 year old son, Jayceon were present sitting nearby. CSW introduced self and requested to speak with MOB alone. MOB provided verbal consent to complete consult with her mother and other son present. CSW explained reason for consult. MOB stated that she is "good" but was agreeable to complete consult. MOB presented as initially guarded but became more relaxed and forthcoming as assessment progressed.   MOB confirmed demographic information in chart. MOB reports she resides with her children. MOB identified her mother as her main support. MOB's mother offered supportive words during assessment and shared that she is planning on helping care for MOB's older son while MOB recovers.   CSW inquired how MOB is feeling emotionally since infant's arrival. MOB shared she is feeling ready to go, explaining that she did not expect infant to have jaundice and require such a prolonged treatment. CSW inquired about MOB's mental health history. MOB initially responded she has been  diagnosed "with pretty much everything." MOB acknowledged prior diagnoses of Bipolar Disorder and Anxiety, stating she was initially diagnosed in 2009. MOB explained that her diagnoses "were in the past" and denied recent mental health symptoms, reporting a stable mood during pregnancy. MOB stated she has "learned to control" and manage mental health symptoms without formal treatment. MOB states she is not current on mental health medications and does not attend therapy. MOB reports her employer offers a therapy resource she can utilize if needed. When asked when the last time MOB experienced a manic or depressive episode was, MOB was unable to recall but stated that it has "been awhile." MOB identified her mother, self care practices, and God as her coping skills. CSW  inquired about a history of postpartum depression. MOB reports she did endorse postpartum depression symptoms after the birth of her older son, marked by feeling tired, overwhelmed, focused on her son, and recalls she did not care for herself. MOB attributed her symptoms to the effects of the Covid-19 pandemic and shared that when she returned to work about 6 months postpartum, her symptoms began to resolve. MOB denied current SI/HI. DV was not assessed due to MOB's mother being present.  CSW provided education regarding the baby blues period vs. perinatal mood disorders, discussed treatment and gave resources for mental health follow up if concerns arise.  CSW recommends self-evaluation during the postpartum time period using the New Mom Checklist from Postpartum Progress and encouraged MOB to contact a medical professional if symptoms are noted at any time.    MOB reports she has all needed items for infant, including a car seat and bassinet. CSW provided review of Sudden Infant Death Syndrome (SIDS) precautions.    CSW inquired about resource needs. MOB expressed interest in a [facility] referral, CSW placed referral and provided MOB with WIC contact information.  CSW identifies no further need for intervention and no barriers to discharge at this time.  CSW Plan/Description:  No Further Intervention Required/No Barriers to Discharge, Sudden Infant Death Syndrome (SIDS) Education, Perinatal Mood and Anxiety Disorder (PMADs) Education, Other Information/Referral to [employer], LCSWA 11/04/2023, 3:10 PM

## 2023-11-05 ENCOUNTER — Encounter (HOSPITAL_COMMUNITY): Payer: Self-pay | Admitting: Obstetrics & Gynecology

## 2023-11-05 ENCOUNTER — Ambulatory Visit (HOSPITAL_COMMUNITY): Payer: Self-pay

## 2023-11-05 NOTE — Lactation Note (Signed)
 This note was copied from a baby's chart. Lactation Consultation Note  Patient Name: Boy Reece Mcbroom RUEAV'W Date: 11/05/2023 Age:35 hours   P2- LC attempted to see MOB, but she was asleep. [facility] team will attempt to consult with her again at a later time.   Vernette Goo BS, IBCLC 11/05/2023, 10:42 PM

## 2023-11-05 NOTE — Discharge Summary (Signed)
 Postpartum Discharge Summary   Patient Name: Elizabeth [name] DOB: Nov 02, 1990 MRN: [id number]  Date of admission: 11/02/2023 Delivery date:11/02/2023 Delivering provider: Vernal Gold Date of discharge: 11/05/2023  Admitting diagnosis: Other specified postprocedural states [Z98.890], Desires permanent sterilization.  Intrauterine pregnancy: [redacted]w[redacted]d     Secondary diagnosis:  Principal Problem:   Other specified postprocedural states Active Problems:   Delivered by cesarean delivery following previous cesarean delivery Status post permanent sterilization via removal of both fallopian tubes.   Additional problems: History of bipolar disorder, no recent medication use, stable.     Discharge diagnosis: Term Pregnancy Delivered                                              Post partum procedures:postpartum tubal ligation Augmentation: N/A Complications: None  Hospital course: Sceduled C/S   33 y.o. yo [id number] at [redacted]w[redacted]d was admitted to the hospital 11/02/2023 for scheduled cesarean section with the following indication:Elective Repeat.Delivery details are as follows:  Membrane Rupture Time/Date: 4:04 PM,11/02/2023  Delivery Method:C-Section, Vacuum Assisted Operative Delivery:Device used:Kiwi Vacuum.,  Details of operation can be found in separate operative note.  Patient had a normal postpartum course.  She is ambulating, tolerating a regular diet, passing flatus, and urinating well. Patient is discharged home in stable condition on  11/05/23.  She reports stable mental status and declines office follow up in 1 to 2 weeks.         Newborn Data: Birth date:11/02/2023 Birth time:4:05 PM Gender:Female Living status:Living Apgars:8 ,9  Weight:3520 g   Immunizations received: Immunization History  Administered Date(s) Administered   HPV Quadrivalent 07/23/2006, 11/08/2006   Influenza,inj,quad, With Preservative 03/17/2014   PFIZER(Purple Top)SARS-COV-2 Vaccination 09/06/2019, 09/30/2019,  05/27/2020   Pneumococcal Conjugate-13 03/17/2014   Tdap 11/21/2015, 03/04/2019    Physical exam  Vitals:   11/04/23 0438 11/04/23 1323 11/04/23 2206 11/05/23 0512  BP: 112/69 110/77 119/70 111/68  Pulse: 75 82 80 84  Resp: 18 19 18 18   Temp: 98.4 F (36.9 C) 98.2 F (36.8 C) 98.2 F (36.8 C) 97.9 F (36.6 C)  TempSrc: Oral Oral Oral Oral  SpO2: 100% 99% 100% 100%  Weight:      Height:       General: alert, cooperative, and no distress Lochia: appropriate Uterine Fundus: firm Incision: Dressing is clean, dry, and intact DVT Evaluation: No evidence of DVT seen on physical exam. Calf/Ankle edema is present Labs: Lab Results  Component Value Date   WBC 14.4 (H) 11/03/2023   HGB 11.4 (L) 11/03/2023   HCT 32.9 (L) 11/03/2023   MCV 90.9 11/03/2023   PLT 157 11/03/2023      Latest Ref Rng & Units 06/26/2022    4:16 PM  CMP  Glucose 70 - 99 mg/dL 85   BUN 6 - 20 mg/dL 8   Creatinine 8.65 - 7.84 mg/dL 6.96   Sodium 295 - 284 mmol/L 136   Potassium 3.5 - 5.1 mmol/L 2.9   Chloride 98 - 111 mmol/L 108   CO2 22 - 32 mmol/L 19   Calcium 8.9 - 10.3 mg/dL 9.2   Total Protein 6.5 - 8.1 g/dL 7.5   Total Bilirubin 0.3 - 1.2 mg/dL 0.5   Alkaline Phos 38 - 126 U/L 53   AST 15 - 41 U/L 21   ALT 0 - 44 U/L 14  Edinburgh Score:    11/03/2023    7:45 PM  Edinburgh Postnatal Depression Scale Screening Tool  I have been able to laugh and see the funny side of things. 0  I have looked forward with enjoyment to things. 0  I have blamed myself unnecessarily when things went wrong. 1  I have been anxious or worried for no good reason. 1  I have felt scared or panicky for no good reason. 0  Things have been getting on top of me. 0  I have been so unhappy that I have had difficulty sleeping. 0  I have felt sad or miserable. 0  I have been so unhappy that I have been crying. 0  The thought of harming myself has occurred to me. 0  Edinburgh Postnatal Depression Scale Total 2   No  data recorded  After visit meds:  Allergies as of 11/05/2023       Reactions   Depakote  [divalproex  Sodium] Anaphylaxis   Penicillins Anaphylaxis, Other (See Comments)   Has patient had a PCN reaction causing immediate rash, facial/tongue/throat swelling, SOB or lightheadedness with hypotension: Yes Has patient had a PCN reaction causing severe rash involving mucus membranes or skin necrosis: No Has patient had a PCN reaction that required hospitalization No Has patient had a PCN reaction occurring within the last 10 years: No If all of the above answers are "NO", then may proceed with Cephalosporin use.   Risperidone  And Related Other (See Comments)   Weight gain   Trazodone  And Nefazodone Other (See Comments)   Pt states that she does not like how this medication makes her feel.    Vistaril  [hydroxyzine  Hcl] Itching   Peanut Butter Flavoring Agent (non-screening) Itching, Rash   Reaction to peanut butter only, tolerates peanuts.        Medication List     STOP taking these medications    aspirin EC 81 MG tablet       TAKE these medications    acetaminophen  500 MG tablet Commonly known as: TYLENOL  Take 2 tablets (1,000 mg total) by mouth every 6 (six) hours.   ibuprofen  600 MG tablet Commonly known as: ADVIL  Take 1 tablet (600 mg total) by mouth every 6 (six) hours.   loratadine  10 MG tablet Commonly known as: CLARITIN  Take 10 mg by mouth daily.   multivitamin with minerals tablet Take 1 tablet by mouth daily.   oxyCODONE  5 MG immediate release tablet Commonly known as: Oxy IR/ROXICODONE  Take 1 tablet (5 mg total) by mouth every 6 (six) hours as needed for up to 3 days for severe pain (pain score 7-10) or breakthrough pain.   PRENATAL VITAMINS PO Take 1 tablet by mouth daily.   Vitamin D  50 MCG (2000 UT) tablet Take 4,000 Units by mouth daily.               Discharge Care Instructions  (From admission, onward)           Start     Ordered    11/04/23 0000  Discharge wound care:       Comments: Take dressing off on 11/06/23, remove it sooner if it is dirty or damaged. Clean area with soap and water  and pat dry. You can leave the steri strips on until they fall off or take them off gently by 11/09/23. Call the office for increased drainage, redness, pain, or warmth. Keep the incision area clean and dry at all times.   11/04/23 1610  Discharge home in stable condition Infant Feeding: Bottle and Breast Infant Disposition:Infant is still admitted and under bilirubin lights. Discharge instruction: per After Visit Summary and Postpartum booklet. Activity: Advance as tolerated. Pelvic rest for 6 weeks.  Diet: routine diet Future Appointments:No future appointments. Follow up Visit:  Follow-up Information     Vernal Gold, MD. Go on 12/13/2023.   Specialty: Obstetrics and Gynecology Why: 6 week postpartum check. Contact information: [address] [place] [address] [phone]                11/05/2023 Charlott Converse, MD

## 2023-11-05 NOTE — Discharge Instructions (Signed)
  Elizabeth [name], 1. While at home remember to walk regularly, at least 1 hour a day, as this will help with your quick recovery. 2. Do not do any heavy lifting, i.e nothing heavier than 15 lbs for the next 6 weeks 3.  Do not use tampons or douche or take baths, do not have any sexual intercourse or anything inside the vagina for the next 6 weeks.  4. Take your pain medication as needed for pain, let us  know if the pain is not well controlled despite pain medication use.  5. Get plenty of rest especially for the next two weeks to allow your body to recover.  You may feel tired in the process, this is normal. 6.  You may get a fever while at home, if you do, check your temperature and if it is equal to or greater than 100.4 please call the office.   7.  Please keep your upcoming appointment at the offices as scheduled.  8.  Remove the Honey comb dressing one week from your surgery date. To remove the honey comb dressing, first take a shower.  After showering pat the dressing dry then peel it off gently from the corners.  After honey comb comes out there will be steri strips left on the incision.  You may continue showering as usual and the steri strips may get wet.  After showering pat the steri strips dry. Allow the steri strips to fall off by themselves over time.  Do not rub the incision directly or put soap directly over the incision.  Always rinse off any soap over the incision and pat the incision dry.  9.  Some vaginal bleeding is expected and normal after your surgery. Please let us  know if if it excessive where you saturate 1 pad in less than 2 hours or so.   Dr. Cyndra Dress [facility] [phone].

## 2023-11-06 ENCOUNTER — Ambulatory Visit (HOSPITAL_COMMUNITY): Payer: Self-pay

## 2023-11-06 LAB — SURGICAL PATHOLOGY

## 2023-11-06 NOTE — Lactation Note (Signed)
 This note was copied from a baby's chart. Lactation Consultation Note  Patient Name: Elizabeth [name] [id number] Date: 11/06/2023 Age:35 days Reason for consult: Follow-up assessment;Term  Offered to assist mother with latching and she declined and stated he needed his diaper changed. Spoke with [employer] and they will deliver her Medela pump today.  Made them aware mother will be discharging. Reviewed engorgement care and monitoring voids/stools.  Feeding Mother's Current Feeding Choice: Breast Milk and Formula Nipple Type: Slow - flow Interventions Interventions: Education  Discharge Discharge Education: Engorgement and breast care;Warning signs for feeding baby  Consult Status Consult Status: Complete  Luellen Sages  RN, IBCLC 11/06/2023, 9:49 AM

## 2023-11-12 ENCOUNTER — Telehealth (HOSPITAL_COMMUNITY): Payer: Self-pay | Admitting: *Deleted

## 2023-11-12 NOTE — Telephone Encounter (Signed)
 11/12/2023  Name: Elizabeth [name] MRN: [id number] DOB: [birth year]  Reason for Call:  Transition of Care Hospital Discharge Call  Contact Status: Patient Contact Status: Message  Language assistant needed:          Follow-Up Questions:    Dimple Francis Postnatal Depression Scale:  In the Past 7 Days:    PHQ2-9 Depression Scale:     Discharge Follow-up:    Post-discharge interventions: NA  Pearlie Bougie, RN 11/12/2023 13:28

## 2023-12-27 DIAGNOSIS — R87810 Cervical high risk human papillomavirus (HPV) DNA test positive: Secondary | ICD-10-CM | POA: Diagnosis not present

## 2023-12-27 DIAGNOSIS — G8929 Other chronic pain: Secondary | ICD-10-CM | POA: Diagnosis not present

## 2023-12-27 DIAGNOSIS — M5441 Lumbago with sciatica, right side: Secondary | ICD-10-CM | POA: Diagnosis not present

## 2023-12-27 DIAGNOSIS — M5442 Lumbago with sciatica, left side: Secondary | ICD-10-CM | POA: Diagnosis not present

## 2023-12-27 DIAGNOSIS — Z1331 Encounter for screening for depression: Secondary | ICD-10-CM | POA: Diagnosis not present

## 2023-12-27 DIAGNOSIS — M5431 Sciatica, right side: Secondary | ICD-10-CM | POA: Diagnosis not present

## 2024-02-22 ENCOUNTER — Inpatient Hospital Stay
Admit: 2024-02-22 | Discharge: 2024-02-22 | Disposition: A | Discharge status: Home / Self Care | Payer: BLUE CROSS/BLUE SHIELD | Arrived: VH | Attending: Emergency Medicine

## 2024-02-22 DIAGNOSIS — U071 COVID-19: Secondary | ICD-10-CM

## 2024-02-22 LAB — COVID-19 & INFLUENZA COMBO
Rapid Influenza A By PCR: NOT DETECTED
Rapid Influenza B By PCR: NOT DETECTED
SARS-CoV-2, PCR: DETECTED — AB

## 2024-02-22 MED ORDER — BENZONATATE 100 MG PO CAPS
100 | ORAL | Status: AC
Start: 2024-02-22 — End: 2024-02-22
  Administered 2024-02-22: 14:00:00 100 mg via ORAL

## 2024-02-22 MED ORDER — BENZONATATE 100 MG PO CAPS
100 | ORAL_CAPSULE | Freq: Two times a day (BID) | ORAL | 0 refills | 7.00000 days | Status: AC | PRN
Start: 2024-02-22 — End: 2024-02-29

## 2024-02-22 MED FILL — BENZONATATE 100 MG PO CAPS: 100 mg | ORAL | Qty: 1 | Fill #0

## 2024-02-22 NOTE — H&P (Addendum)
 EMERGENCY DEPARTMENT HISTORY AND PHYSICAL EXAM      Date: 02/22/2024  Patient Name: Janet Pugh    History of Presenting Illness     Chief Complaint   Patient presents with    Cough       History (Context): Janet Pugh is a 33 y.o. female with PMHx of childhood asthma presents to the ED today with chief complaint of 2 days of cough and headaches. Patient has had sick contact with children who were sick last week with similar symptoms. She notes associated fevers with high of 101 F, today temp was 99 F. Also notes chills, sore throat, congestion, and ear pain. She has been taking OTC nasal decongestants, tylenol and motrin with improvement. Denies any chest pain, shortness of breath, abdominal pain, nausea, vomiting. She is a prior smoker with quit date 5 years ago.       PCP: No primary care provider on file.    No current facility-administered medications for this encounter.     Current Outpatient Medications   Medication Sig Dispense Refill    NONFORMULARY Birth control plls         Past History     Past Medical History:   Past Medical History:   Diagnosis Date    Eczema        Past Surgical History:  Past Surgical History:   Procedure Laterality Date    ANKLE FRACTURE SURGERY      with reconstruction    CESAREAN SECTION  2015       Family History:  History reviewed. No pertinent family history.    Social History:   Social History     Tobacco Use    Smoking status: Former     Current packs/day: 0.25     Types: Cigarettes     Passive exposure: Past    Smokeless tobacco: Never   Vaping Use    Vaping status: Never Used   Substance Use Topics    Alcohol use: Never    Drug use: No       Allergies:  No Known Allergies    Review of Systems   Review of Systems   Constitutional:  Positive for chills, fatigue and fever. Negative for activity change and appetite change.   HENT:  Positive for ear pain, sinus pressure and sore throat.    Eyes:  Negative for visual disturbance.   Respiratory:  Positive for cough.  Negative for shortness of breath.    Cardiovascular:  Negative for chest pain and leg swelling.   Gastrointestinal:  Negative for abdominal pain, diarrhea, nausea and vomiting.   Genitourinary:  Negative for dysuria.   Musculoskeletal:  Negative for arthralgias, back pain, gait problem and joint swelling.   Skin:  Negative for rash.   Neurological:  Positive for headaches. Negative for dizziness, weakness, light-headedness and numbness.       Physical Exam     Vitals:    02/22/24 0930 02/22/24 0945 02/22/24 0954 02/22/24 0955   BP: (!) 185/110 (!) 162/100 (!) 162/100    Pulse:  97     Resp:  20     Temp:       TempSrc:       SpO2: 97% 99%  98%   Weight:       Height:           Physical Exam  Constitutional:       General: She is not in acute distress.  Appearance: Normal appearance. She is normal weight.   HENT:      Head: Normocephalic and atraumatic.      Right Ear: A middle ear effusion is present.      Left Ear: A middle ear effusion is present.      Nose: Nose normal.      Mouth/Throat:      Mouth: Mucous membranes are moist.      Pharynx: Oropharynx is clear. Posterior oropharyngeal erythema present.   Eyes:      Extraocular Movements: Extraocular movements intact.      Pupils: Pupils are equal, round, and reactive to light.   Cardiovascular:      Rate and Rhythm: Normal rate and regular rhythm.      Pulses: Normal pulses.      Heart sounds: Normal heart sounds.   Pulmonary:      Effort: Pulmonary effort is normal.      Breath sounds: Normal breath sounds.   Abdominal:      General: Abdomen is flat.      Palpations: Abdomen is soft.   Musculoskeletal:         General: Normal range of motion.      Cervical back: Normal range of motion.   Skin:     General: Skin is warm.      Capillary Refill: Capillary refill takes less than 2 seconds.   Neurological:      General: No focal deficit present.      Mental Status: She is alert.   Psychiatric:         Mood and Affect: Mood normal.         Diagnostic Study Results      Labs -   No results found for this or any previous visit (from the past 12 hours).   Labs Reviewed   COVID-19 & INFLUENZA COMBO - Abnormal; Notable for the following components:       Result Value    SARS-CoV-2, PCR Detected (*)     All other components within normal limits       Radiologic Studies -   No orders to display         The laboratory results, imaging results, and other diagnostic exams were reviewed in the EMR.    Medical Decision Making   I am the first provider for this patient.    I reviewed the vital signs, available nursing notes, past medical history, past surgical history, family history and social history.    Vital Signs-Reviewed the patient's vital signs.       Records Reviewed: Personally, on initial evaluation    MDM:   DDX includes but is not limited to: viral URI, hypertension      33 year old female with PMHx of childhood asthma presented for cough and headaches. Patient history notable for sick contact with children who had similar symptoms. Vitals on arrival noting patient is hypertensive to 185 systolic. Physical notes patient in no acute distress, posterior oropharyngeal erythema and bilateral ear effusion. Patient swabbed for COVID/influ which came back positive for sars-covid. Patient blood pressure was re measured following initial evaluation and came down to 162, re measured again at end of visit and noted stable. Patient denies any known history of hypertension. Suspect high blood pressure is secondary to OTC decongestant use and acute viral infection. Advised patient to stop use of decongestant and re measure blood pressure tomorrow morning. If still elevated she will need to consult PCP for  work up of primary hypertension. Patient overall hemodynamically stable for discharge home.         Will discharge patient home with strict return precautions and follow up recommendations. Patient verbalized understanding and is without further questions.      Orders as below:  Orders  Placed This Encounter   Procedures    COVID-19 & Influenza Combo          ED Course:   ED Course as of 03/01/24 1627   Fri Feb 22, 2024   1009 Patient has COVID influenza pending.  She is well-appearing.  Vital signs are within normal limits except for elevated blood pressure which she says she does not have hypertension.  Patient is also requesting a note for work note to go back Monday. [MI]      ED Course User Index  [MI] Corinthia Oneil LABOR, MD         Is this patient to be included in the SEP-1 core measure? No Exclusion criteria - the patient is NOT to be included for SEP-1 Core Measure due to: 2+ SIRS criteria are not met      Procedures:

## 2024-02-22 NOTE — ED Triage Notes (Addendum)
 Pt has had cold symptoms for a couple of days. Taking muccinex and Nyquil. Gets a tingle in her throat and she coughs. Sounds congested. Decreased hearing. Feels like a ring of fire around her head. Has pain around her nose

## 2024-02-22 NOTE — Discharge Instructions (Addendum)
 Come back if you get worse.  Follow-up without fail.
